# Patient Record
Sex: Female | Born: 1935
Health system: Southern US, Community
[De-identification: ages and names within clinical notes are randomized; demographics above are authoritative.]

## PROBLEM LIST (undated history)

## (undated) DIAGNOSIS — I10 Essential (primary) hypertension: Secondary | ICD-10-CM

## (undated) DIAGNOSIS — I4821 Permanent atrial fibrillation: Secondary | ICD-10-CM

## (undated) DIAGNOSIS — I2510 Atherosclerotic heart disease of native coronary artery without angina pectoris: Secondary | ICD-10-CM

## (undated) DIAGNOSIS — E119 Type 2 diabetes mellitus without complications: Secondary | ICD-10-CM

## (undated) DIAGNOSIS — D179 Benign lipomatous neoplasm, unspecified: Secondary | ICD-10-CM

## (undated) DIAGNOSIS — I499 Cardiac arrhythmia, unspecified: Secondary | ICD-10-CM

## (undated) DIAGNOSIS — R001 Bradycardia, unspecified: Secondary | ICD-10-CM

## (undated) DIAGNOSIS — G4730 Sleep apnea, unspecified: Secondary | ICD-10-CM

## (undated) DIAGNOSIS — M1990 Unspecified osteoarthritis, unspecified site: Secondary | ICD-10-CM

## (undated) DIAGNOSIS — Z950 Presence of cardiac pacemaker: Secondary | ICD-10-CM

## (undated) DIAGNOSIS — I509 Heart failure, unspecified: Secondary | ICD-10-CM

## (undated) DIAGNOSIS — Z8739 Personal history of other diseases of the musculoskeletal system and connective tissue: Secondary | ICD-10-CM

## (undated) DIAGNOSIS — F419 Anxiety disorder, unspecified: Secondary | ICD-10-CM

## (undated) DIAGNOSIS — I219 Acute myocardial infarction, unspecified: Secondary | ICD-10-CM

## (undated) HISTORY — PX: OTHER SURGICAL HISTORY: SHX169

## (undated) HISTORY — DX: Bradycardia, unspecified: R00.1

## (undated) HISTORY — PX: ABDOMINAL HYSTERECTOMY: SHX81

## (undated) HISTORY — DX: Permanent atrial fibrillation: I48.21

---

## 1990-05-10 HISTORY — PX: PACEMAKER INSERTION: SHX728

## 2008-05-31 HISTORY — PX: PACEMAKER GENERATOR CHANGE: SHX5998

## 2009-09-08 ENCOUNTER — Ambulatory Visit: Payer: Self-pay | Admitting: Cardiology

## 2011-05-19 DIAGNOSIS — N952 Postmenopausal atrophic vaginitis: Secondary | ICD-10-CM | POA: Diagnosis not present

## 2011-05-19 DIAGNOSIS — Z90710 Acquired absence of both cervix and uterus: Secondary | ICD-10-CM | POA: Diagnosis not present

## 2011-05-19 DIAGNOSIS — I509 Heart failure, unspecified: Secondary | ICD-10-CM | POA: Diagnosis not present

## 2011-05-19 DIAGNOSIS — I10 Essential (primary) hypertension: Secondary | ICD-10-CM | POA: Diagnosis not present

## 2011-05-19 DIAGNOSIS — Z01419 Encounter for gynecological examination (general) (routine) without abnormal findings: Secondary | ICD-10-CM | POA: Diagnosis not present

## 2011-05-19 DIAGNOSIS — Z1272 Encounter for screening for malignant neoplasm of vagina: Secondary | ICD-10-CM | POA: Diagnosis not present

## 2011-05-20 DIAGNOSIS — I10 Essential (primary) hypertension: Secondary | ICD-10-CM | POA: Diagnosis not present

## 2011-05-20 DIAGNOSIS — E119 Type 2 diabetes mellitus without complications: Secondary | ICD-10-CM | POA: Diagnosis not present

## 2011-05-20 DIAGNOSIS — E782 Mixed hyperlipidemia: Secondary | ICD-10-CM | POA: Diagnosis not present

## 2011-05-20 DIAGNOSIS — M159 Polyosteoarthritis, unspecified: Secondary | ICD-10-CM | POA: Diagnosis not present

## 2011-05-20 DIAGNOSIS — M549 Dorsalgia, unspecified: Secondary | ICD-10-CM | POA: Diagnosis not present

## 2011-05-20 DIAGNOSIS — N760 Acute vaginitis: Secondary | ICD-10-CM | POA: Diagnosis not present

## 2011-05-20 DIAGNOSIS — I509 Heart failure, unspecified: Secondary | ICD-10-CM | POA: Diagnosis not present

## 2011-07-12 DIAGNOSIS — E782 Mixed hyperlipidemia: Secondary | ICD-10-CM | POA: Diagnosis not present

## 2011-07-12 DIAGNOSIS — N760 Acute vaginitis: Secondary | ICD-10-CM | POA: Diagnosis not present

## 2011-07-12 DIAGNOSIS — I10 Essential (primary) hypertension: Secondary | ICD-10-CM | POA: Diagnosis not present

## 2011-07-12 DIAGNOSIS — M159 Polyosteoarthritis, unspecified: Secondary | ICD-10-CM | POA: Diagnosis not present

## 2011-07-12 DIAGNOSIS — M549 Dorsalgia, unspecified: Secondary | ICD-10-CM | POA: Diagnosis not present

## 2011-07-12 DIAGNOSIS — I509 Heart failure, unspecified: Secondary | ICD-10-CM | POA: Diagnosis not present

## 2011-07-13 DIAGNOSIS — Z45018 Encounter for adjustment and management of other part of cardiac pacemaker: Secondary | ICD-10-CM | POA: Diagnosis not present

## 2011-07-13 DIAGNOSIS — I495 Sick sinus syndrome: Secondary | ICD-10-CM | POA: Diagnosis not present

## 2011-10-19 DIAGNOSIS — I495 Sick sinus syndrome: Secondary | ICD-10-CM | POA: Diagnosis not present

## 2011-10-19 DIAGNOSIS — Z45018 Encounter for adjustment and management of other part of cardiac pacemaker: Secondary | ICD-10-CM | POA: Diagnosis not present

## 2011-11-01 DIAGNOSIS — M25539 Pain in unspecified wrist: Secondary | ICD-10-CM | POA: Diagnosis not present

## 2011-11-01 DIAGNOSIS — E782 Mixed hyperlipidemia: Secondary | ICD-10-CM | POA: Diagnosis not present

## 2011-11-01 DIAGNOSIS — M549 Dorsalgia, unspecified: Secondary | ICD-10-CM | POA: Diagnosis not present

## 2011-12-16 DIAGNOSIS — G4730 Sleep apnea, unspecified: Secondary | ICD-10-CM | POA: Diagnosis not present

## 2011-12-16 DIAGNOSIS — R55 Syncope and collapse: Secondary | ICD-10-CM | POA: Diagnosis not present

## 2011-12-20 DIAGNOSIS — R0609 Other forms of dyspnea: Secondary | ICD-10-CM | POA: Diagnosis not present

## 2011-12-20 DIAGNOSIS — I079 Rheumatic tricuspid valve disease, unspecified: Secondary | ICD-10-CM | POA: Diagnosis not present

## 2011-12-20 DIAGNOSIS — R55 Syncope and collapse: Secondary | ICD-10-CM | POA: Diagnosis not present

## 2011-12-20 DIAGNOSIS — I059 Rheumatic mitral valve disease, unspecified: Secondary | ICD-10-CM | POA: Diagnosis not present

## 2011-12-20 DIAGNOSIS — I2789 Other specified pulmonary heart diseases: Secondary | ICD-10-CM | POA: Diagnosis not present

## 2011-12-20 DIAGNOSIS — R5381 Other malaise: Secondary | ICD-10-CM | POA: Diagnosis not present

## 2012-01-02 DIAGNOSIS — I10 Essential (primary) hypertension: Secondary | ICD-10-CM | POA: Diagnosis not present

## 2012-01-02 DIAGNOSIS — I509 Heart failure, unspecified: Secondary | ICD-10-CM | POA: Diagnosis not present

## 2012-01-02 DIAGNOSIS — E119 Type 2 diabetes mellitus without complications: Secondary | ICD-10-CM | POA: Diagnosis not present

## 2012-01-02 DIAGNOSIS — M1710 Unilateral primary osteoarthritis, unspecified knee: Secondary | ICD-10-CM | POA: Diagnosis not present

## 2012-01-18 DIAGNOSIS — I495 Sick sinus syndrome: Secondary | ICD-10-CM | POA: Diagnosis not present

## 2012-03-02 DIAGNOSIS — E119 Type 2 diabetes mellitus without complications: Secondary | ICD-10-CM | POA: Diagnosis not present

## 2012-03-02 DIAGNOSIS — I509 Heart failure, unspecified: Secondary | ICD-10-CM | POA: Diagnosis not present

## 2012-03-02 DIAGNOSIS — Z794 Long term (current) use of insulin: Secondary | ICD-10-CM | POA: Diagnosis not present

## 2012-03-02 DIAGNOSIS — M1710 Unilateral primary osteoarthritis, unspecified knee: Secondary | ICD-10-CM | POA: Diagnosis not present

## 2012-03-02 DIAGNOSIS — I10 Essential (primary) hypertension: Secondary | ICD-10-CM | POA: Diagnosis not present

## 2012-03-16 DIAGNOSIS — I10 Essential (primary) hypertension: Secondary | ICD-10-CM | POA: Diagnosis not present

## 2012-04-18 DIAGNOSIS — I10 Essential (primary) hypertension: Secondary | ICD-10-CM | POA: Diagnosis not present

## 2012-04-18 DIAGNOSIS — I495 Sick sinus syndrome: Secondary | ICD-10-CM | POA: Diagnosis not present

## 2012-05-05 DIAGNOSIS — I10 Essential (primary) hypertension: Secondary | ICD-10-CM | POA: Diagnosis not present

## 2012-05-05 DIAGNOSIS — Z780 Asymptomatic menopausal state: Secondary | ICD-10-CM | POA: Diagnosis not present

## 2012-05-05 DIAGNOSIS — M1990 Unspecified osteoarthritis, unspecified site: Secondary | ICD-10-CM | POA: Diagnosis present

## 2012-05-05 DIAGNOSIS — Z79899 Other long term (current) drug therapy: Secondary | ICD-10-CM | POA: Diagnosis not present

## 2012-05-05 DIAGNOSIS — Z950 Presence of cardiac pacemaker: Secondary | ICD-10-CM | POA: Diagnosis not present

## 2012-05-05 DIAGNOSIS — I5031 Acute diastolic (congestive) heart failure: Secondary | ICD-10-CM | POA: Diagnosis not present

## 2012-05-05 DIAGNOSIS — E785 Hyperlipidemia, unspecified: Secondary | ICD-10-CM | POA: Diagnosis not present

## 2012-05-05 DIAGNOSIS — Z8042 Family history of malignant neoplasm of prostate: Secondary | ICD-10-CM | POA: Diagnosis not present

## 2012-05-05 DIAGNOSIS — Z87891 Personal history of nicotine dependence: Secondary | ICD-10-CM | POA: Diagnosis not present

## 2012-05-05 DIAGNOSIS — Z7982 Long term (current) use of aspirin: Secondary | ICD-10-CM | POA: Diagnosis not present

## 2012-05-05 DIAGNOSIS — I369 Nonrheumatic tricuspid valve disorder, unspecified: Secondary | ICD-10-CM | POA: Diagnosis not present

## 2012-05-05 DIAGNOSIS — J90 Pleural effusion, not elsewhere classified: Secondary | ICD-10-CM | POA: Diagnosis not present

## 2012-05-05 DIAGNOSIS — R0602 Shortness of breath: Secondary | ICD-10-CM | POA: Diagnosis not present

## 2012-05-05 DIAGNOSIS — I509 Heart failure, unspecified: Secondary | ICD-10-CM | POA: Diagnosis not present

## 2012-05-05 DIAGNOSIS — I517 Cardiomegaly: Secondary | ICD-10-CM | POA: Diagnosis not present

## 2012-05-05 DIAGNOSIS — E119 Type 2 diabetes mellitus without complications: Secondary | ICD-10-CM | POA: Diagnosis not present

## 2012-05-05 DIAGNOSIS — Z794 Long term (current) use of insulin: Secondary | ICD-10-CM | POA: Diagnosis not present

## 2012-05-05 DIAGNOSIS — Z8249 Family history of ischemic heart disease and other diseases of the circulatory system: Secondary | ICD-10-CM | POA: Diagnosis not present

## 2012-05-11 DIAGNOSIS — I509 Heart failure, unspecified: Secondary | ICD-10-CM | POA: Diagnosis not present

## 2012-05-11 DIAGNOSIS — E119 Type 2 diabetes mellitus without complications: Secondary | ICD-10-CM | POA: Diagnosis not present

## 2012-05-11 DIAGNOSIS — Z794 Long term (current) use of insulin: Secondary | ICD-10-CM | POA: Diagnosis not present

## 2012-05-11 DIAGNOSIS — I10 Essential (primary) hypertension: Secondary | ICD-10-CM | POA: Diagnosis not present

## 2012-05-11 DIAGNOSIS — M159 Polyosteoarthritis, unspecified: Secondary | ICD-10-CM | POA: Diagnosis not present

## 2012-05-11 DIAGNOSIS — I5031 Acute diastolic (congestive) heart failure: Secondary | ICD-10-CM | POA: Diagnosis not present

## 2012-05-16 DIAGNOSIS — E119 Type 2 diabetes mellitus without complications: Secondary | ICD-10-CM | POA: Diagnosis not present

## 2012-05-16 DIAGNOSIS — M159 Polyosteoarthritis, unspecified: Secondary | ICD-10-CM | POA: Diagnosis not present

## 2012-05-16 DIAGNOSIS — I509 Heart failure, unspecified: Secondary | ICD-10-CM | POA: Diagnosis not present

## 2012-05-16 DIAGNOSIS — I10 Essential (primary) hypertension: Secondary | ICD-10-CM | POA: Diagnosis not present

## 2012-05-16 DIAGNOSIS — Z794 Long term (current) use of insulin: Secondary | ICD-10-CM | POA: Diagnosis not present

## 2012-05-16 DIAGNOSIS — I5031 Acute diastolic (congestive) heart failure: Secondary | ICD-10-CM | POA: Diagnosis not present

## 2012-05-18 DIAGNOSIS — I10 Essential (primary) hypertension: Secondary | ICD-10-CM | POA: Diagnosis not present

## 2012-05-18 DIAGNOSIS — I509 Heart failure, unspecified: Secondary | ICD-10-CM | POA: Diagnosis not present

## 2012-05-18 DIAGNOSIS — M159 Polyosteoarthritis, unspecified: Secondary | ICD-10-CM | POA: Diagnosis not present

## 2012-05-18 DIAGNOSIS — Z794 Long term (current) use of insulin: Secondary | ICD-10-CM | POA: Diagnosis not present

## 2012-05-18 DIAGNOSIS — I5031 Acute diastolic (congestive) heart failure: Secondary | ICD-10-CM | POA: Diagnosis not present

## 2012-05-18 DIAGNOSIS — E119 Type 2 diabetes mellitus without complications: Secondary | ICD-10-CM | POA: Diagnosis not present

## 2012-05-23 DIAGNOSIS — J209 Acute bronchitis, unspecified: Secondary | ICD-10-CM | POA: Diagnosis not present

## 2012-05-23 DIAGNOSIS — E782 Mixed hyperlipidemia: Secondary | ICD-10-CM | POA: Diagnosis not present

## 2012-05-24 DIAGNOSIS — M159 Polyosteoarthritis, unspecified: Secondary | ICD-10-CM | POA: Diagnosis not present

## 2012-05-24 DIAGNOSIS — Z794 Long term (current) use of insulin: Secondary | ICD-10-CM | POA: Diagnosis not present

## 2012-05-24 DIAGNOSIS — I5031 Acute diastolic (congestive) heart failure: Secondary | ICD-10-CM | POA: Diagnosis not present

## 2012-05-24 DIAGNOSIS — E119 Type 2 diabetes mellitus without complications: Secondary | ICD-10-CM | POA: Diagnosis not present

## 2012-05-24 DIAGNOSIS — I509 Heart failure, unspecified: Secondary | ICD-10-CM | POA: Diagnosis not present

## 2012-05-24 DIAGNOSIS — I10 Essential (primary) hypertension: Secondary | ICD-10-CM | POA: Diagnosis not present

## 2012-05-30 DIAGNOSIS — B372 Candidiasis of skin and nail: Secondary | ICD-10-CM | POA: Diagnosis not present

## 2012-06-12 DIAGNOSIS — I5031 Acute diastolic (congestive) heart failure: Secondary | ICD-10-CM | POA: Diagnosis not present

## 2012-06-12 DIAGNOSIS — I10 Essential (primary) hypertension: Secondary | ICD-10-CM | POA: Diagnosis not present

## 2012-06-12 DIAGNOSIS — M159 Polyosteoarthritis, unspecified: Secondary | ICD-10-CM | POA: Diagnosis not present

## 2012-06-12 DIAGNOSIS — E119 Type 2 diabetes mellitus without complications: Secondary | ICD-10-CM | POA: Diagnosis not present

## 2012-06-12 DIAGNOSIS — Z794 Long term (current) use of insulin: Secondary | ICD-10-CM | POA: Diagnosis not present

## 2012-06-12 DIAGNOSIS — I509 Heart failure, unspecified: Secondary | ICD-10-CM | POA: Diagnosis not present

## 2012-06-15 DIAGNOSIS — Z794 Long term (current) use of insulin: Secondary | ICD-10-CM | POA: Diagnosis not present

## 2012-06-15 DIAGNOSIS — I5031 Acute diastolic (congestive) heart failure: Secondary | ICD-10-CM | POA: Diagnosis not present

## 2012-06-15 DIAGNOSIS — I10 Essential (primary) hypertension: Secondary | ICD-10-CM | POA: Diagnosis not present

## 2012-06-15 DIAGNOSIS — E119 Type 2 diabetes mellitus without complications: Secondary | ICD-10-CM | POA: Diagnosis not present

## 2012-06-15 DIAGNOSIS — M159 Polyosteoarthritis, unspecified: Secondary | ICD-10-CM | POA: Diagnosis not present

## 2012-06-15 DIAGNOSIS — I509 Heart failure, unspecified: Secondary | ICD-10-CM | POA: Diagnosis not present

## 2012-07-03 DIAGNOSIS — I5031 Acute diastolic (congestive) heart failure: Secondary | ICD-10-CM | POA: Diagnosis not present

## 2012-07-20 DIAGNOSIS — I495 Sick sinus syndrome: Secondary | ICD-10-CM | POA: Diagnosis not present

## 2012-08-22 DIAGNOSIS — M109 Gout, unspecified: Secondary | ICD-10-CM | POA: Diagnosis not present

## 2012-08-29 DIAGNOSIS — I519 Heart disease, unspecified: Secondary | ICD-10-CM | POA: Diagnosis not present

## 2012-08-29 DIAGNOSIS — I10 Essential (primary) hypertension: Secondary | ICD-10-CM | POA: Diagnosis not present

## 2012-08-29 DIAGNOSIS — E785 Hyperlipidemia, unspecified: Secondary | ICD-10-CM | POA: Diagnosis not present

## 2012-08-29 DIAGNOSIS — E119 Type 2 diabetes mellitus without complications: Secondary | ICD-10-CM | POA: Diagnosis not present

## 2012-10-28 DIAGNOSIS — E119 Type 2 diabetes mellitus without complications: Secondary | ICD-10-CM | POA: Diagnosis not present

## 2012-10-28 DIAGNOSIS — E785 Hyperlipidemia, unspecified: Secondary | ICD-10-CM | POA: Diagnosis not present

## 2012-10-28 DIAGNOSIS — I519 Heart disease, unspecified: Secondary | ICD-10-CM | POA: Diagnosis not present

## 2012-10-28 DIAGNOSIS — I10 Essential (primary) hypertension: Secondary | ICD-10-CM | POA: Diagnosis not present

## 2012-11-11 DIAGNOSIS — R079 Chest pain, unspecified: Secondary | ICD-10-CM | POA: Diagnosis not present

## 2012-11-11 DIAGNOSIS — M25519 Pain in unspecified shoulder: Secondary | ICD-10-CM | POA: Diagnosis not present

## 2012-11-11 DIAGNOSIS — S6990XA Unspecified injury of unspecified wrist, hand and finger(s), initial encounter: Secondary | ICD-10-CM | POA: Diagnosis not present

## 2012-11-11 DIAGNOSIS — S298XXA Other specified injuries of thorax, initial encounter: Secondary | ICD-10-CM | POA: Diagnosis not present

## 2012-11-11 DIAGNOSIS — M7989 Other specified soft tissue disorders: Secondary | ICD-10-CM | POA: Diagnosis not present

## 2012-11-11 DIAGNOSIS — S4980XA Other specified injuries of shoulder and upper arm, unspecified arm, initial encounter: Secondary | ICD-10-CM | POA: Diagnosis not present

## 2012-11-11 DIAGNOSIS — M79609 Pain in unspecified limb: Secondary | ICD-10-CM | POA: Diagnosis not present

## 2012-11-11 DIAGNOSIS — IMO0002 Reserved for concepts with insufficient information to code with codable children: Secondary | ICD-10-CM | POA: Diagnosis not present

## 2012-11-23 DIAGNOSIS — M542 Cervicalgia: Secondary | ICD-10-CM

## 2012-12-04 DIAGNOSIS — E782 Mixed hyperlipidemia: Secondary | ICD-10-CM | POA: Diagnosis not present

## 2012-12-04 DIAGNOSIS — R609 Edema, unspecified: Secondary | ICD-10-CM | POA: Diagnosis not present

## 2012-12-27 DIAGNOSIS — I10 Essential (primary) hypertension: Secondary | ICD-10-CM | POA: Diagnosis not present

## 2012-12-27 DIAGNOSIS — E119 Type 2 diabetes mellitus without complications: Secondary | ICD-10-CM | POA: Diagnosis not present

## 2012-12-27 DIAGNOSIS — I519 Heart disease, unspecified: Secondary | ICD-10-CM | POA: Diagnosis not present

## 2012-12-27 DIAGNOSIS — E785 Hyperlipidemia, unspecified: Secondary | ICD-10-CM | POA: Diagnosis not present

## 2013-01-15 DIAGNOSIS — Z23 Encounter for immunization: Secondary | ICD-10-CM | POA: Diagnosis not present

## 2013-02-25 DIAGNOSIS — I519 Heart disease, unspecified: Secondary | ICD-10-CM | POA: Diagnosis not present

## 2013-02-25 DIAGNOSIS — I10 Essential (primary) hypertension: Secondary | ICD-10-CM | POA: Diagnosis not present

## 2013-02-25 DIAGNOSIS — E785 Hyperlipidemia, unspecified: Secondary | ICD-10-CM | POA: Diagnosis not present

## 2013-02-25 DIAGNOSIS — E119 Type 2 diabetes mellitus without complications: Secondary | ICD-10-CM | POA: Diagnosis not present

## 2013-03-06 DIAGNOSIS — I10 Essential (primary) hypertension: Secondary | ICD-10-CM | POA: Diagnosis not present

## 2013-04-26 DIAGNOSIS — I519 Heart disease, unspecified: Secondary | ICD-10-CM | POA: Diagnosis not present

## 2013-04-26 DIAGNOSIS — I10 Essential (primary) hypertension: Secondary | ICD-10-CM | POA: Diagnosis not present

## 2013-04-26 DIAGNOSIS — E785 Hyperlipidemia, unspecified: Secondary | ICD-10-CM | POA: Diagnosis not present

## 2013-04-26 DIAGNOSIS — E119 Type 2 diabetes mellitus without complications: Secondary | ICD-10-CM | POA: Diagnosis not present

## 2013-05-24 DIAGNOSIS — H538 Other visual disturbances: Secondary | ICD-10-CM | POA: Diagnosis not present

## 2013-05-24 DIAGNOSIS — H2510 Age-related nuclear cataract, unspecified eye: Secondary | ICD-10-CM | POA: Diagnosis not present

## 2013-06-05 DIAGNOSIS — I10 Essential (primary) hypertension: Secondary | ICD-10-CM | POA: Diagnosis not present

## 2013-06-05 DIAGNOSIS — E782 Mixed hyperlipidemia: Secondary | ICD-10-CM | POA: Diagnosis not present

## 2013-06-05 DIAGNOSIS — IMO0001 Reserved for inherently not codable concepts without codable children: Secondary | ICD-10-CM | POA: Diagnosis not present

## 2013-06-25 DIAGNOSIS — E785 Hyperlipidemia, unspecified: Secondary | ICD-10-CM | POA: Diagnosis not present

## 2013-06-25 DIAGNOSIS — E119 Type 2 diabetes mellitus without complications: Secondary | ICD-10-CM | POA: Diagnosis not present

## 2013-06-25 DIAGNOSIS — I10 Essential (primary) hypertension: Secondary | ICD-10-CM | POA: Diagnosis not present

## 2013-06-25 DIAGNOSIS — I519 Heart disease, unspecified: Secondary | ICD-10-CM | POA: Diagnosis not present

## 2013-08-24 DIAGNOSIS — I10 Essential (primary) hypertension: Secondary | ICD-10-CM | POA: Diagnosis not present

## 2013-08-24 DIAGNOSIS — E785 Hyperlipidemia, unspecified: Secondary | ICD-10-CM | POA: Diagnosis not present

## 2013-08-24 DIAGNOSIS — E119 Type 2 diabetes mellitus without complications: Secondary | ICD-10-CM | POA: Diagnosis not present

## 2013-08-24 DIAGNOSIS — I519 Heart disease, unspecified: Secondary | ICD-10-CM | POA: Diagnosis not present

## 2013-08-30 DIAGNOSIS — M549 Dorsalgia, unspecified: Secondary | ICD-10-CM | POA: Diagnosis not present

## 2013-09-13 DIAGNOSIS — Z1231 Encounter for screening mammogram for malignant neoplasm of breast: Secondary | ICD-10-CM | POA: Diagnosis not present

## 2013-10-23 DIAGNOSIS — I10 Essential (primary) hypertension: Secondary | ICD-10-CM | POA: Diagnosis not present

## 2013-10-23 DIAGNOSIS — E785 Hyperlipidemia, unspecified: Secondary | ICD-10-CM | POA: Diagnosis not present

## 2013-10-23 DIAGNOSIS — E119 Type 2 diabetes mellitus without complications: Secondary | ICD-10-CM | POA: Diagnosis not present

## 2013-10-23 DIAGNOSIS — I519 Heart disease, unspecified: Secondary | ICD-10-CM | POA: Diagnosis not present

## 2013-11-29 DIAGNOSIS — M5430 Sciatica, unspecified side: Secondary | ICD-10-CM | POA: Diagnosis not present

## 2013-11-29 DIAGNOSIS — IMO0001 Reserved for inherently not codable concepts without codable children: Secondary | ICD-10-CM | POA: Diagnosis not present

## 2013-12-05 DIAGNOSIS — E782 Mixed hyperlipidemia: Secondary | ICD-10-CM | POA: Diagnosis not present

## 2013-12-05 DIAGNOSIS — IMO0001 Reserved for inherently not codable concepts without codable children: Secondary | ICD-10-CM | POA: Diagnosis not present

## 2013-12-22 DIAGNOSIS — I519 Heart disease, unspecified: Secondary | ICD-10-CM | POA: Diagnosis not present

## 2013-12-22 DIAGNOSIS — I10 Essential (primary) hypertension: Secondary | ICD-10-CM | POA: Diagnosis not present

## 2013-12-22 DIAGNOSIS — E785 Hyperlipidemia, unspecified: Secondary | ICD-10-CM | POA: Diagnosis not present

## 2013-12-22 DIAGNOSIS — E119 Type 2 diabetes mellitus without complications: Secondary | ICD-10-CM | POA: Diagnosis not present

## 2014-02-15 DIAGNOSIS — Z23 Encounter for immunization: Secondary | ICD-10-CM | POA: Diagnosis not present

## 2014-02-20 DIAGNOSIS — E785 Hyperlipidemia, unspecified: Secondary | ICD-10-CM | POA: Diagnosis not present

## 2014-02-20 DIAGNOSIS — I10 Essential (primary) hypertension: Secondary | ICD-10-CM | POA: Diagnosis not present

## 2014-02-20 DIAGNOSIS — I519 Heart disease, unspecified: Secondary | ICD-10-CM | POA: Diagnosis not present

## 2014-02-20 DIAGNOSIS — E119 Type 2 diabetes mellitus without complications: Secondary | ICD-10-CM | POA: Diagnosis not present

## 2014-03-11 DIAGNOSIS — E1165 Type 2 diabetes mellitus with hyperglycemia: Secondary | ICD-10-CM | POA: Diagnosis not present

## 2014-03-11 DIAGNOSIS — I10 Essential (primary) hypertension: Secondary | ICD-10-CM | POA: Diagnosis not present

## 2014-03-31 DIAGNOSIS — I10 Essential (primary) hypertension: Secondary | ICD-10-CM | POA: Diagnosis not present

## 2014-03-31 DIAGNOSIS — M25511 Pain in right shoulder: Secondary | ICD-10-CM | POA: Diagnosis not present

## 2014-03-31 DIAGNOSIS — Z79899 Other long term (current) drug therapy: Secondary | ICD-10-CM | POA: Diagnosis not present

## 2014-03-31 DIAGNOSIS — E119 Type 2 diabetes mellitus without complications: Secondary | ICD-10-CM | POA: Diagnosis not present

## 2014-03-31 DIAGNOSIS — Z87891 Personal history of nicotine dependence: Secondary | ICD-10-CM | POA: Diagnosis not present

## 2014-03-31 DIAGNOSIS — K219 Gastro-esophageal reflux disease without esophagitis: Secondary | ICD-10-CM | POA: Diagnosis not present

## 2014-03-31 DIAGNOSIS — M79622 Pain in left upper arm: Secondary | ICD-10-CM | POA: Diagnosis not present

## 2014-03-31 DIAGNOSIS — M25512 Pain in left shoulder: Secondary | ICD-10-CM | POA: Diagnosis not present

## 2014-03-31 DIAGNOSIS — M79621 Pain in right upper arm: Secondary | ICD-10-CM | POA: Diagnosis not present

## 2014-03-31 DIAGNOSIS — Z794 Long term (current) use of insulin: Secondary | ICD-10-CM | POA: Diagnosis not present

## 2014-03-31 DIAGNOSIS — S161XXA Strain of muscle, fascia and tendon at neck level, initial encounter: Secondary | ICD-10-CM | POA: Diagnosis not present

## 2014-03-31 DIAGNOSIS — Y92410 Unspecified street and highway as the place of occurrence of the external cause: Secondary | ICD-10-CM | POA: Diagnosis not present

## 2014-04-21 DIAGNOSIS — E785 Hyperlipidemia, unspecified: Secondary | ICD-10-CM | POA: Diagnosis not present

## 2014-04-21 DIAGNOSIS — I519 Heart disease, unspecified: Secondary | ICD-10-CM | POA: Diagnosis not present

## 2014-04-21 DIAGNOSIS — I10 Essential (primary) hypertension: Secondary | ICD-10-CM | POA: Diagnosis not present

## 2014-04-21 DIAGNOSIS — E119 Type 2 diabetes mellitus without complications: Secondary | ICD-10-CM | POA: Diagnosis not present

## 2014-06-10 DIAGNOSIS — Z1389 Encounter for screening for other disorder: Secondary | ICD-10-CM | POA: Diagnosis not present

## 2014-06-10 DIAGNOSIS — Z0000 Encounter for general adult medical examination without abnormal findings: Secondary | ICD-10-CM | POA: Diagnosis not present

## 2014-06-10 DIAGNOSIS — E0842 Diabetes mellitus due to underlying condition with diabetic polyneuropathy: Secondary | ICD-10-CM | POA: Diagnosis not present

## 2014-06-20 DIAGNOSIS — E119 Type 2 diabetes mellitus without complications: Secondary | ICD-10-CM | POA: Diagnosis not present

## 2014-06-20 DIAGNOSIS — E785 Hyperlipidemia, unspecified: Secondary | ICD-10-CM | POA: Diagnosis not present

## 2014-06-20 DIAGNOSIS — I10 Essential (primary) hypertension: Secondary | ICD-10-CM | POA: Diagnosis not present

## 2014-06-20 DIAGNOSIS — I519 Heart disease, unspecified: Secondary | ICD-10-CM | POA: Diagnosis not present

## 2014-08-19 DIAGNOSIS — E119 Type 2 diabetes mellitus without complications: Secondary | ICD-10-CM | POA: Diagnosis not present

## 2014-08-19 DIAGNOSIS — E785 Hyperlipidemia, unspecified: Secondary | ICD-10-CM | POA: Diagnosis not present

## 2014-08-19 DIAGNOSIS — I10 Essential (primary) hypertension: Secondary | ICD-10-CM | POA: Diagnosis not present

## 2014-08-19 DIAGNOSIS — I519 Heart disease, unspecified: Secondary | ICD-10-CM | POA: Diagnosis not present

## 2014-09-09 DIAGNOSIS — E119 Type 2 diabetes mellitus without complications: Secondary | ICD-10-CM | POA: Diagnosis not present

## 2014-09-09 DIAGNOSIS — L84 Corns and callosities: Secondary | ICD-10-CM | POA: Diagnosis not present

## 2014-09-09 DIAGNOSIS — E0842 Diabetes mellitus due to underlying condition with diabetic polyneuropathy: Secondary | ICD-10-CM | POA: Diagnosis not present

## 2014-09-12 DIAGNOSIS — E119 Type 2 diabetes mellitus without complications: Secondary | ICD-10-CM | POA: Diagnosis not present

## 2014-09-17 DIAGNOSIS — Z1231 Encounter for screening mammogram for malignant neoplasm of breast: Secondary | ICD-10-CM | POA: Diagnosis not present

## 2014-09-22 ENCOUNTER — Other Ambulatory Visit (HOSPITAL_COMMUNITY): Payer: Self-pay

## 2014-09-22 ENCOUNTER — Emergency Department (HOSPITAL_COMMUNITY): Payer: Medicare Other

## 2014-09-22 ENCOUNTER — Observation Stay (HOSPITAL_COMMUNITY)
Admission: EM | Admit: 2014-09-22 | Discharge: 2014-09-24 | Disposition: A | Discharge status: Home / Self Care | Payer: Medicare Other | Attending: Internal Medicine | Admitting: Internal Medicine

## 2014-09-22 ENCOUNTER — Encounter (HOSPITAL_COMMUNITY): Payer: Self-pay | Admitting: Emergency Medicine

## 2014-09-22 DIAGNOSIS — I2510 Atherosclerotic heart disease of native coronary artery without angina pectoris: Secondary | ICD-10-CM | POA: Diagnosis present

## 2014-09-22 DIAGNOSIS — I4891 Unspecified atrial fibrillation: Secondary | ICD-10-CM | POA: Diagnosis not present

## 2014-09-22 DIAGNOSIS — R0789 Other chest pain: Secondary | ICD-10-CM | POA: Diagnosis not present

## 2014-09-22 DIAGNOSIS — I10 Essential (primary) hypertension: Secondary | ICD-10-CM | POA: Diagnosis present

## 2014-09-22 DIAGNOSIS — E119 Type 2 diabetes mellitus without complications: Secondary | ICD-10-CM

## 2014-09-22 DIAGNOSIS — Z8679 Personal history of other diseases of the circulatory system: Secondary | ICD-10-CM

## 2014-09-22 DIAGNOSIS — M109 Gout, unspecified: Secondary | ICD-10-CM | POA: Diagnosis present

## 2014-09-22 DIAGNOSIS — R9431 Abnormal electrocardiogram [ECG] [EKG]: Secondary | ICD-10-CM

## 2014-09-22 DIAGNOSIS — Z950 Presence of cardiac pacemaker: Secondary | ICD-10-CM | POA: Diagnosis present

## 2014-09-22 DIAGNOSIS — R079 Chest pain, unspecified: Secondary | ICD-10-CM | POA: Diagnosis not present

## 2014-09-22 DIAGNOSIS — I509 Heart failure, unspecified: Secondary | ICD-10-CM | POA: Diagnosis not present

## 2014-09-22 DIAGNOSIS — R531 Weakness: Secondary | ICD-10-CM | POA: Diagnosis not present

## 2014-09-22 DIAGNOSIS — R609 Edema, unspecified: Secondary | ICD-10-CM | POA: Diagnosis not present

## 2014-09-22 DIAGNOSIS — I25118 Atherosclerotic heart disease of native coronary artery with other forms of angina pectoris: Secondary | ICD-10-CM | POA: Diagnosis not present

## 2014-09-22 DIAGNOSIS — N179 Acute kidney failure, unspecified: Secondary | ICD-10-CM | POA: Diagnosis present

## 2014-09-22 DIAGNOSIS — Z794 Long term (current) use of insulin: Secondary | ICD-10-CM

## 2014-09-22 DIAGNOSIS — R0602 Shortness of breath: Secondary | ICD-10-CM | POA: Diagnosis not present

## 2014-09-22 HISTORY — DX: Anxiety disorder, unspecified: F41.9

## 2014-09-22 HISTORY — DX: Type 2 diabetes mellitus without complications: E11.9

## 2014-09-22 HISTORY — DX: Cardiac arrhythmia, unspecified: I49.9

## 2014-09-22 HISTORY — DX: Presence of cardiac pacemaker: Z95.0

## 2014-09-22 HISTORY — DX: Sleep apnea, unspecified: G47.30

## 2014-09-22 HISTORY — DX: Acute myocardial infarction, unspecified: I21.9

## 2014-09-22 HISTORY — DX: Atherosclerotic heart disease of native coronary artery without angina pectoris: I25.10

## 2014-09-22 HISTORY — DX: Essential (primary) hypertension: I10

## 2014-09-22 HISTORY — DX: Heart failure, unspecified: I50.9

## 2014-09-22 HISTORY — DX: Benign lipomatous neoplasm, unspecified: D17.9

## 2014-09-22 LAB — I-STAT CHEM 8, ED
BUN: 44 mg/dL — ABNORMAL HIGH (ref 6–20)
CALCIUM ION: 1.14 mmol/L (ref 1.13–1.30)
CREATININE: 1.3 mg/dL — AB (ref 0.44–1.00)
Chloride: 98 mmol/L — ABNORMAL LOW (ref 101–111)
GLUCOSE: 154 mg/dL — AB (ref 65–99)
HCT: 43 % (ref 36.0–46.0)
Hemoglobin: 14.6 g/dL (ref 12.0–15.0)
Potassium: 4.1 mmol/L (ref 3.5–5.1)
Sodium: 138 mmol/L (ref 135–145)
TCO2: 26 mmol/L (ref 0–100)

## 2014-09-22 LAB — CBC
HCT: 39.8 % (ref 36.0–46.0)
HEMOGLOBIN: 12.8 g/dL (ref 12.0–15.0)
MCH: 27.8 pg (ref 26.0–34.0)
MCHC: 32.2 g/dL (ref 30.0–36.0)
MCV: 86.5 fL (ref 78.0–100.0)
PLATELETS: 183 10*3/uL (ref 150–400)
RBC: 4.6 MIL/uL (ref 3.87–5.11)
RDW: 13.2 % (ref 11.5–15.5)
WBC: 3.8 10*3/uL — AB (ref 4.0–10.5)

## 2014-09-22 LAB — COMPREHENSIVE METABOLIC PANEL
ALBUMIN: 4.2 g/dL (ref 3.5–5.0)
ALT: 20 U/L (ref 14–54)
ANION GAP: 10 (ref 5–15)
AST: 37 U/L (ref 15–41)
Alkaline Phosphatase: 56 U/L (ref 38–126)
BUN: 36 mg/dL — ABNORMAL HIGH (ref 6–20)
CALCIUM: 9.6 mg/dL (ref 8.9–10.3)
CO2: 28 mmol/L (ref 22–32)
Chloride: 100 mmol/L — ABNORMAL LOW (ref 101–111)
Creatinine, Ser: 1.3 mg/dL — ABNORMAL HIGH (ref 0.44–1.00)
GFR calc Af Amer: 44 mL/min — ABNORMAL LOW (ref >60)
GFR, EST NON AFRICAN AMERICAN: 38 mL/min — AB (ref >60)
GLUCOSE: 158 mg/dL — AB (ref 65–99)
Potassium: 4.1 mmol/L (ref 3.5–5.1)
SODIUM: 138 mmol/L (ref 135–145)
Total Bilirubin: 1.1 mg/dL (ref 0.3–1.2)
Total Protein: 7.4 g/dL (ref 6.5–8.1)

## 2014-09-22 LAB — PROTIME-INR
INR: 1.09 (ref 0.00–1.49)
Prothrombin Time: 14.2 seconds (ref 11.6–15.2)

## 2014-09-22 LAB — APTT: aPTT: 23 seconds — ABNORMAL LOW (ref 24–37)

## 2014-09-22 LAB — GLUCOSE, CAPILLARY
Glucose-Capillary: 91 mg/dL (ref 65–99)
Glucose-Capillary: 159 mg/dL — ABNORMAL HIGH (ref 65–99)

## 2014-09-22 LAB — I-STAT TROPONIN, ED: Troponin i, poc: 0.02 ng/mL (ref 0.00–0.08)

## 2014-09-22 LAB — TROPONIN I: Troponin I: <0.03 ng/mL (ref <0.031)

## 2014-09-22 MED ORDER — INSULIN ASPART 100 UNIT/ML ~~LOC~~ SOLN
0.0000 [IU] | Freq: Three times a day (TID) | SUBCUTANEOUS | Status: DC
Start: 2014-09-22 — End: 2014-09-24

## 2014-09-22 MED ORDER — NITROGLYCERIN 0.4 MG SL SUBL: 0.4000 mg | SUBLINGUAL_TABLET | SUBLINGUAL | Status: DC | PRN

## 2014-09-22 MED ORDER — LISINOPRIL 20 MG PO TABS: 20.0000 mg | ORAL_TABLET | Freq: Every day | ORAL | Status: DC

## 2014-09-22 MED ORDER — HEPARIN SODIUM (PORCINE) 5000 UNIT/ML IJ SOLN
5000.0000 [IU] | Freq: Three times a day (TID) | INTRAMUSCULAR | Status: DC
  Administered 2014-09-22 – 2014-09-24 (×6): 5000 [IU] via SUBCUTANEOUS
  Filled 2014-09-22 (×6): qty 1

## 2014-09-22 MED ORDER — TRAMADOL HCL 50 MG PO TABS
50.0000 mg | ORAL_TABLET | Freq: Three times a day (TID) | ORAL | Status: DC
  Administered 2014-09-22 – 2014-09-24 (×7): 50 mg via ORAL
  Filled 2014-09-22 (×7): qty 1

## 2014-09-22 MED ORDER — SODIUM CHLORIDE 0.9 % IV SOLN: Freq: Once | INTRAVENOUS | Status: AC

## 2014-09-22 MED ORDER — MORPHINE SULFATE 2 MG/ML IJ SOLN: 2.0000 mg | INTRAMUSCULAR | Status: DC | PRN

## 2014-09-22 MED ORDER — FUROSEMIDE 20 MG PO TABS: 10.0000 mg | ORAL_TABLET | Freq: Every day | ORAL | Status: DC

## 2014-09-22 MED ORDER — ONDANSETRON HCL 4 MG/2ML IJ SOLN: 4.0000 mg | Freq: Four times a day (QID) | INTRAMUSCULAR | Status: DC | PRN

## 2014-09-22 MED ORDER — INSULIN ASPART PROT & ASPART (70-30 MIX) 100 UNIT/ML ~~LOC~~ SUSP
14.0000 [IU] | Freq: Every day | SUBCUTANEOUS | Status: DC
  Administered 2014-09-23: 14 [IU] via SUBCUTANEOUS
  Filled 2014-09-22: qty 10

## 2014-09-22 MED ORDER — INSULIN ASPART 100 UNIT/ML ~~LOC~~ SOLN: 0.0000 [IU] | Freq: Every day | SUBCUTANEOUS | Status: DC

## 2014-09-22 MED ORDER — SODIUM CHLORIDE 0.9 % IV SOLN
20.0000 mL | INTRAVENOUS | Status: DC
  Administered 2014-09-22: 14:00:00 via INTRAVENOUS

## 2014-09-22 MED ORDER — GI COCKTAIL ~~LOC~~: 30.0000 mL | Freq: Four times a day (QID) | ORAL | Status: DC | PRN

## 2014-09-22 MED ORDER — ALLOPURINOL 300 MG PO TABS
300.0000 mg | ORAL_TABLET | Freq: Every day | ORAL | Status: DC
  Administered 2014-09-23 – 2014-09-24 (×2): 300 mg via ORAL
  Filled 2014-09-22 (×2): qty 1

## 2014-09-22 MED ORDER — SIMVASTATIN 40 MG PO TABS
40.0000 mg | ORAL_TABLET | Freq: Every day | ORAL | Status: DC
  Administered 2014-09-22 – 2014-09-24 (×3): 40 mg via ORAL
  Filled 2014-09-22 (×3): qty 1

## 2014-09-22 MED ORDER — ASPIRIN 81 MG PO CHEW: 324.0000 mg | CHEWABLE_TABLET | Freq: Once | ORAL | Status: DC

## 2014-09-22 MED ORDER — SODIUM CHLORIDE 0.9 % IV SOLN
INTRAVENOUS | Status: DC
  Administered 2014-09-22: 18:00:00 via INTRAVENOUS

## 2014-09-22 MED ORDER — ACETAMINOPHEN 325 MG PO TABS: 650.0000 mg | ORAL_TABLET | ORAL | Status: DC | PRN

## 2014-09-22 NOTE — Consult Note (Signed)
CARDIOLOGY CONSULT NOTE   Patient ID: Megan Walsh MRN: [id number] DOB/AGE: December 03, 1932 81 y.o.  Admit Date: 09/22/2014  Primary Physician: Neale Burly, MD  Primary Cardiologist    Followed  [facility]   Clinical Summary Megan Walsh is a 81 y.o.female. We have no outside information. There is a history of coronary disease and a pacemaker. She's been followed at [facility] She says that she has not been seen in the last year. Her primary physician is in [facility]. She had some chest discomfort at church today. She was transported to [facility]. Her first troponin is normal. Her EKG shows suggestion of intermittent pacing. The non-paced beats have diffuse T-wave changes. Her pain is completely resolved.   No Known Allergies  Medications Scheduled Medications: . allopurinol  300 mg Oral Daily  . aspirin  324 mg Oral Once  . insulin aspart  0-5 Units Subcutaneous QHS  . insulin aspart  0-9 Units Subcutaneous TID WC  . [START ON 09/23/2014] insulin aspart protamine- aspart  14 Units Subcutaneous Q breakfast  . simvastatin  40 mg Oral q1800  . traMADol  50 mg Oral TID     Infusions: . sodium chloride 20 mL/hr at 09/22/14 1358  . sodium chloride       PRN Medications:  nitroGLYCERIN   Past Medical History  Diagnosis Date  . Hypertension   . Diabetes mellitus without complication   . CHF (congestive heart failure)   . Fatty tumor     Past Surgical History  Procedure Laterality Date  . Pacemaker insertion    . Abdominal hysterectomy      Family history is positive for coronary disease.  Social History Megan Walsh reports that she has never smoked. She does not have any smokeless tobacco history on file. Megan Walsh reports that she does not drink alcohol.  Review of Systems Patient denies fever, chills, headache, sweats, rash, change in vision, change in hearing, cough, nausea or vomiting, urinary symptoms. All other systems are reviewed  and are negative.  Physical Examination Blood pressure 106/73, pulse 62, temperature 97.6 F (36.4 C), temperature source Oral, resp. rate 16, SpO2 100 %. No intake or output data in the 24 hours ending 09/22/14 1548  Patient is oriented to person time and place. Affect is normal. She is here with her son and her pastor. Head is atraumatic. Sclera and conjunctiva are normal. There is no jugular venous distention. Lungs are clear. Respiratory effort is not labored. Cardiac exam reveals S1 and S2. Abdomen is soft and there is no peripheral edema. There are no musculoskeletal deformities P there are no skin rashes. Neurologic is grossly intact.  Prior Cardiac Testing/Procedures  Lab Results  Basic Metabolic Panel:  Recent Labs Lab 09/22/14 1340 09/22/14 1353  NA 138 138  K 4.1 4.1  CL 100* 98*  CO2 28  --   GLUCOSE 158* 154*  BUN 36* 44*  CREATININE 1.30* 1.30*  CALCIUM 9.6  --     Liver Function Tests:  Recent Labs Lab 09/22/14 1340  AST 37  ALT 20  ALKPHOS 56  BILITOT 1.1  PROT 7.4  ALBUMIN 4.2    CBC:  Recent Labs Lab 09/22/14 1340 09/22/14 1353  WBC 3.8*  --   HGB 12.8 14.6  HCT 39.8 43.0  MCV 86.5  --   PLT 183  --     Cardiac Enzymes: No results for input(s): CKTOTAL, CKMB, CKMBINDEX, TROPONINI in the last 168 hours.  BNP: Invalid input(s): POCBNP   Radiology: Dg Chest Portable 1 View  09/22/2014   CLINICAL DATA:  Short of breath today  EXAM: PORTABLE CHEST - 1 VIEW  COMPARISON:  09/16/2010  FINDINGS: The heart is moderately enlarged. Normal vascularity. Clear lungs. Dual lead left subclavian pacemaker and leads are stable and intact. No pneumothorax. No pleural effusion.  IMPRESSION: No active disease.   Electronically Signed   By: Marybelle Killings M.D.   On: 09/22/2014 14:25     ECG: There are no old EKGs. I have reviewed the current EKG. There are wide complex beats that are probably paced beats. The non-paced beats have diffuse ST  changes.   Impression and Recommendations    Chest pain   CAD (coronary artery disease)     The patient noted that her chest discomfort today was similar to pain that she's had in the past. It is concerning for ischemic pain. Fortunately it is resolved and her first troponin is normal. EKG is difficult to assess as there is some pacing present. The plan will be to admit the patient and check troponins. It will be key to obtain prior outside information concerning her care. At this point I have not arranged for any further ischemic testing tomorrow until we have more information.      HTN (hypertension)   Diabetes mellitus type 2, insulin dependent    Pacemaker     We need outside information concerning her pacemaker.    History of CHF (congestive heart failure)     Volume status is stable.   Megan November, MD 09/22/2014, 3:48 PM

## 2014-09-22 NOTE — ED Provider Notes (Signed)
CSN: [id number]     Arrival date & time 09/22/14  1315 History   First MD Initiated Contact with Patient 09/22/14 1315     Chief Complaint  Patient presents with  . Chest Pain  . Weakness     (Consider location/radiation/quality/duration/timing/severity/associated sxs/prior Treatment) HPI 81 y.o. Female with history of cad, history of hypertension, and diabetes who presents today complaining of an episode at church where she began having some anterior chest pressure with sweating and some nausea. She rates the pain initially at an 8 out of 10. It was nonradiating. Pain decreased to a 4 after aspirin via EMS. They report not giving nitroglycerin due to systolic blood pressure of approximately 100. EMS was called from the church. She was transported directly here via [facility]. Prehospital EKG done shows atrial fibrillation with aberrant conduction. Past Medical History  Diagnosis Date  . Hypertension   . Diabetes mellitus without complication   . CHF (congestive heart failure)   . Fatty tumor    Past Surgical History  Procedure Laterality Date  . Pacemaker insertion    . Abdominal hysterectomy     No family history on file. History  Substance Use Topics  . Smoking status: Never Smoker   . Smokeless tobacco: Not on file  . Alcohol Use: No   OB History    No data available     Review of Systems  All other systems reviewed and are negative.     Allergies  Review of patient's allergies indicates no known allergies.  Home Medications   Prior to Admission medications   Not on File   BP 132/79 mmHg  Pulse 63  Temp(Src) 97.6 F (36.4 C) (Oral)  Resp 15  SpO2 100% Physical Exam  Constitutional: She is oriented to person, place, and time. She appears well-developed and well-nourished. No distress.  HENT:  Head: Normocephalic and atraumatic.  Right Ear: External ear normal.  Left Ear: External ear normal.  Nose: Nose normal.  Mouth/Throat: Oropharynx is  clear and moist.  Eyes: Conjunctivae and EOM are normal. Pupils are equal, round, and reactive to light.  Neck: Normal range of motion.  Cardiovascular: Normal rate, regular rhythm, normal heart sounds and intact distal pulses.   Pulmonary/Chest: Effort normal and breath sounds normal. No respiratory distress. She has no wheezes.  Abdominal: Soft. Bowel sounds are normal.  Musculoskeletal: Normal range of motion. She exhibits edema. She exhibits no tenderness.  Neurological: She is alert and oriented to person, place, and time. She has normal reflexes.  Skin: Skin is warm and dry.  Psychiatric: She has a normal mood and affect. Her behavior is normal. Judgment and thought content normal.  Nursing note and vitals reviewed.   ED Course  Procedures (including critical care time) Labs Review Labs Reviewed  APTT - Abnormal; Notable for the following:    aPTT 23 (*)    All other components within normal limits  CBC - Abnormal; Notable for the following:    WBC 3.8 (*)    All other components within normal limits  COMPREHENSIVE METABOLIC PANEL - Abnormal; Notable for the following:    Chloride 100 (*)    Glucose, Bld 158 (*)    BUN 36 (*)    Creatinine, Ser 1.30 (*)    GFR calc non Af Amer 38 (*)    GFR calc Af Amer 44 (*)    All other components within normal limits  I-STAT CHEM 8, ED - Abnormal; Notable  for the following:    Chloride 98 (*)    BUN 44 (*)    Creatinine, Ser 1.30 (*)    Glucose, Bld 154 (*)    All other components within normal limits  PROTIME-INR  URINALYSIS, ROUTINE W REFLEX MICROSCOPIC  I-STAT TROPOININ, ED    Imaging Review No results found. ED ECG REPORT   Date: 09/22/2014  Rate: 63  Rhythm: indeterminate  QRS Axis: indeterminate  Intervals: normal  ST/T Wave abnormalities: nonspecific ST/T changes  Conduction Disutrbances:nonspecific intraventricular conduction delay  Narrative Interpretation:   Old EKG Reviewed: none available  I have  personally reviewed the EKG tracing and agree with the computerized printout as noted.   MDM   Final diagnoses:  Chest pain, unspecified chest pain type  Abnormal EKG    81 y.o. Female with reported h.o. Of cad, s/p pacemaker, and congestive heart failure who presents today complaining of chest pain began just prior to transport. EKG shows regular rate of 63 with intraventricular induction delay and repolarization abnormality. She is pain-free here in emergency department after receiving aspirin prehospital. Initial troponin is normal. Plan admission for further evaluation.  Discussed with Ebony Hail for hospitalist and Dr.Katz on for cardiology.   Pattricia Boss, MD 09/22/14 [phone]

## 2014-09-22 NOTE — H&P (Signed)
[facility] History and Physical                                                                                    Megan Walsh, is a 81 y.o. female  MRN: [id number]   DOB - [birth year]  Admit Date - 09/22/2014  Outpatient Primary MD for the patient is Dr. Freida Busman?  Referring MD: Jeanell Sparrow / ER  Consulting MD: Ron Parker /Cardiology  With History of -  Past Medical History  Diagnosis Date  . Hypertension   . Diabetes mellitus without complication   . CHF (congestive heart failure)   . Fatty tumor       Past Surgical History  Procedure Laterality Date  . Pacemaker insertion    . Abdominal hysterectomy      in for   Chief Complaint  Patient presents with  . Chest Pain  . Weakness     HPI This is a 81 year old female patient with history of CAD and prior MI remotely, former tobacco abuse, hypertension, diabetes on insulin, pacemaker followed by EP at [facility], history of CHF, atrial fibrillation not on Coumadin (secondary to recurrent hematuria). Patient reports that today while at church she began having substernal chest pain that did not radiate but was associated with diaphoresis and a sensation of feeling hot. This chest pain lasted about an hour. Patient did receive an aspirin in route and was transported from [facility] via EMS. Her initial EKG demonstrated a left bundle branch block with underlying atrial fibrillation and unifocal PVCs as well as T-wave inversion in the lateral leads. No old EKGs for comparison. Initial troponin was normal. In further discussion with the patient she has noticed increasing fatigue as well as shortness of breath and dyspnea on exertion for about 1 month. No recent stress test. She also is complaining of right arm pain that occurs with movement of the right arm and was not associated with her chest pain.  Upon arrival to the ER in addition to the above clinical findings patient was afebrile, blood pressure  somewhat soft ranging between 114/57 and 106/73, heart rate 84, room air sats 90-100%. Electrolyte panel unremarkable except for BUN 44 and creatinine 1.3 with baseline renal function unknown. Glucose mildly elevated 154. Troponin 0.02. White count 3800 platelets normal 183,000, hemoglobin 12.8. Coags normal. Chest x-ray unremarkable.   Review of Systems   In addition to the HPI above,  No Fever-chills, myalgias or other constitutional symptoms No Headache, changes with Vision or hearing, new weakness, tingling, numbness in any extremity, No problems swallowing food or Liquids, indigestion/reflux No Cough, palpitations, orthopnea  No Abdominal pain, N/V; no melena or hematochezia, no dark tarry stools, Bowel movements are regular, No dysuria, hematuria or flank pain No new skin rashes, lesions, masses or bruises, No recent weight gain or loss No polyuria, polydypsia or polyphagia,  *A full 10 point Review of Systems was done, except as stated above, all other Review of Systems were negative.  Social History History  Substance Use Topics  . Smoking status: Never Smoker   . Smokeless tobacco: Not on file  . Alcohol Use: No  Resides at: Private residence  Lives with: Alone  Ambulatory status: Ambulatory without assistive devices prior to admission   Family History No family history on file. Discussed with patient and no pertinent family history regarding this admission noting patient has advanced age and has multiple risk factors as well as personal history of CAD.   Prior to Admission medications   Medication Sig Start Date End Date Taking? Authorizing Provider  allopurinol (ZYLOPRIM) 300 MG tablet Take 300 mg by mouth daily.   Yes Historical Provider, MD  furosemide (LASIX) 20 MG tablet Take 10 mg by mouth.   Yes Historical Provider, MD  hydrochlorothiazide (HYDRODIURIL) 25 MG tablet Take 25 mg by mouth daily.   Yes Historical Provider, MD  insulin aspart protamine- aspart  (NOVOLOG MIX 70/30) (70-30) 100 UNIT/ML injection Inject 14 Units into the skin daily as needed. Per sliding scale   Yes Historical Provider, MD  lisinopril (PRINIVIL,ZESTRIL) 20 MG tablet Take 20 mg by mouth daily.   Yes Historical Provider, MD  simvastatin (ZOCOR) 40 MG tablet Take 40 mg by mouth daily.   Yes Historical Provider, MD  traMADol (ULTRAM) 50 MG tablet Take 50 mg by mouth 3 (three) times daily.   Yes Historical Provider, MD    No Known Allergies  Physical Exam  Vitals  Blood pressure 106/73, pulse 62, temperature 97.6 F (36.4 C), temperature source Oral, resp. rate 16, SpO2 100 %.   General:  In no acute distress, appears healthy and well nourished  Psych:  Normal affect, Denies Suicidal or Homicidal ideations, Awake Alert, Oriented X 3. Speech and thought patterns are clear and appropriate, no apparent short term memory deficits  Neuro:   No focal neurological deficits, CN II through XII intact, Strength 5/5 all 4 extremities, Sensation intact all 4 extremities.  ENT:  Ears and Eyes appear Normal, Conjunctivae clear, PER. Moist oral mucosa without erythema or exudates.  Neck:  Supple, No lymphadenopathy appreciated  Respiratory:  Symmetrical chest wall movement, Good air movement bilaterally, CTAB. Room Air  Cardiac:  RRR, No Murmurs, no LE edema noted, no JVD, No carotid bruits, peripheral pulses palpable at 2+  Abdomen:  Positive bowel sounds, Soft, Non tender, Non distended,  No masses appreciated, no obvious hepatosplenomegaly  Skin:  No Cyanosis, Normal Skin Turgor, No Skin Rash or Bruise.  Extremities: Symmetrical without obvious trauma or injury,  no effusions.  Data Review  CBC  Recent Labs Lab 09/22/14 1340 09/22/14 1353  WBC 3.8*  --   HGB 12.8 14.6  HCT 39.8 43.0  PLT 183  --   MCV 86.5  --   MCH 27.8  --   MCHC 32.2  --   RDW 13.2  --     Chemistries   Recent Labs Lab 09/22/14 1340 09/22/14 1353  NA 138 138  K 4.1 4.1  CL 100*  98*  CO2 28  --   GLUCOSE 158* 154*  BUN 36* 44*  CREATININE 1.30* 1.30*  CALCIUM 9.6  --   AST 37  --   ALT 20  --   ALKPHOS 56  --   BILITOT 1.1  --     CrCl cannot be calculated (Unknown ideal weight.).  No results for input(s): TSH, T4TOTAL, T3FREE, THYROIDAB in the last 72 hours.  Invalid input(s): FREET3  Coagulation profile  Recent Labs Lab 09/22/14 1340  INR 1.09    No results for input(s): DDIMER in the last 72 hours.  Cardiac Enzymes No results for  input(s): CKMB, TROPONINI, MYOGLOBIN in the last 168 hours.  Invalid input(s): CK  Invalid input(s): POCBNP  Urinalysis No results found for: COLORURINE, APPEARANCEUR, LABSPEC, PHURINE, GLUCOSEU, HGBUR, BILIRUBINUR, KETONESUR, PROTEINUR, UROBILINOGEN, NITRITE, LEUKOCYTESUR  Imaging results:   Dg Chest Portable 1 View  09/22/2014   CLINICAL DATA:  Short of breath today  EXAM: PORTABLE CHEST - 1 VIEW  COMPARISON:  09/16/2010  FINDINGS: The heart is moderately enlarged. Normal vascularity. Clear lungs. Dual lead left subclavian pacemaker and leads are stable and intact. No pneumothorax. No pleural effusion.  IMPRESSION: No active disease.   Electronically Signed   By: Marybelle Killings M.D.   On: 09/22/2014 14:25     EKG: (Independently reviewed) wide complex underlying rhythm consistent with left bundle branch block-uncertain of chronic-atrial fibrillation, T-wave inversion lateral leads, QTC 488 ms-T wave inversions as well as bundle branch block could be indicative of acute ischemic changes but given patient's history of pacemaker and no prior EKGs for comparison these may also be chronic conduction abnormalities for this patient   Assessment & Plan  Principal Problem:   Chest pain/known coronary artery disease -Admit to 3 west telemetry-redevelops chest pain will be able to easily transitioned to a stepdown bed -Consult cardiology -Heart score equals 7 -Given full dose aspirin in ER and will continue -Have not  ordered antihypertensives and will not order beta blocker given soft blood pressures -Cycle cardiac isoenzymes -If redevelops chest pain will likely need to begin heparin infusion and either nitroglycerin ointment or IV nitroglycerin -Suspect may benefit from Myoview stress test this admission -Check lipid panel  Active Problems:   Acute renal failure -Baseline renal function unknown -Hold ACE inhibitor and diuretic -Gentle IV fluid hydration    Atrial fibrillation with controlled ventricular response -Currently rate controlled and was not on rate controlling agents prior to admission -CHADVASc = 7    Diabetes mellitus type 2, insulin dependent -Resume preadmission 70/30 insulin -Follow CBGs and provide sliding scale insulin    Pacemaker -Reviewed records from [facility]-as DDIR pacer in but no documentation regarding rationale for insertion    HTN  -Current blood pressure soft -Hold antihypertensive medications and diuretics    History of CHF -Reported as history but no prior echo to clarify etiology -Echo ordered this admission -ACE inhibitor and diuretic on hold as above    Gout -No acute symptoms -Continue allopurinol    DVT Prophylaxis: Subcutaneous heparin  Family Communication:  No family at bedside  Code Status:  Full code  Condition:  Stable  Discharge disposition: Anticipate will return to home pending cardiac evaluation  Time spent in minutes : 60      ELLIS,ALLISON L. ANP on 09/22/2014 at 3:39 PM  Between 7am to 7pm - Pager - [phone]  After 7pm go to [email or web address] - password [email or web address]  And look for the night coverage person covering me after hours  [facility]

## 2014-09-22 NOTE — ED Notes (Signed)
Received pt via EMS from church with c/o chest pain and weakness.

## 2014-09-22 NOTE — ED Notes (Signed)
Pt reports non radiating midsternal chest pressure during church. Pt also reports SOB and generalized weakness. Denies chest pain at the time. Lung sounds clear and equal bilaterally. Skin warm and dry. Pt is alert and oriented x4.

## 2014-09-23 ENCOUNTER — Ambulatory Visit (INDEPENDENT_AMBULATORY_CARE_PROVIDER_SITE_OTHER): Payer: Medicare Other

## 2014-09-23 ENCOUNTER — Encounter (HOSPITAL_COMMUNITY): Payer: Self-pay | Admitting: Physician Assistant

## 2014-09-23 DIAGNOSIS — R0789 Other chest pain: Secondary | ICD-10-CM | POA: Diagnosis not present

## 2014-09-23 DIAGNOSIS — R9431 Abnormal electrocardiogram [ECG] [EKG]: Secondary | ICD-10-CM | POA: Diagnosis not present

## 2014-09-23 DIAGNOSIS — I4891 Unspecified atrial fibrillation: Secondary | ICD-10-CM | POA: Diagnosis not present

## 2014-09-23 DIAGNOSIS — I25118 Atherosclerotic heart disease of native coronary artery with other forms of angina pectoris: Secondary | ICD-10-CM | POA: Diagnosis not present

## 2014-09-23 DIAGNOSIS — R079 Chest pain, unspecified: Secondary | ICD-10-CM | POA: Diagnosis not present

## 2014-09-23 LAB — URINALYSIS, ROUTINE W REFLEX MICROSCOPIC
Bilirubin Urine: NEGATIVE
Glucose, UA: NEGATIVE mg/dL
Hgb urine dipstick: NEGATIVE
KETONES UR: NEGATIVE mg/dL
NITRITE: NEGATIVE
Protein, ur: NEGATIVE mg/dL
Specific Gravity, Urine: 1.016 (ref 1.005–1.030)
Urobilinogen, UA: 0.2 mg/dL (ref 0.0–1.0)
pH: 6.5 (ref 5.0–8.0)

## 2014-09-23 LAB — URINE MICROSCOPIC-ADD ON

## 2014-09-23 LAB — GLUCOSE, CAPILLARY
GLUCOSE-CAPILLARY: 112 mg/dL — AB (ref 65–99)
GLUCOSE-CAPILLARY: 51 mg/dL — AB (ref 65–99)
GLUCOSE-CAPILLARY: 111 mg/dL — AB (ref 65–99)
Glucose-Capillary: 125 mg/dL — ABNORMAL HIGH (ref 65–99)
Glucose-Capillary: 63 mg/dL — ABNORMAL LOW (ref 65–99)
Glucose-Capillary: 114 mg/dL — ABNORMAL HIGH (ref 65–99)

## 2014-09-23 LAB — LIPID PANEL
CHOL/HDL RATIO: 2 ratio
CHOLESTEROL: 124 mg/dL (ref 0–200)
HDL: 62 mg/dL (ref >40)
LDL Cholesterol: 48 mg/dL (ref 0–99)
Triglycerides: 69 mg/dL (ref <150)
VLDL: 14 mg/dL (ref 0–40)

## 2014-09-23 LAB — HEMOGLOBIN A1C
HEMOGLOBIN A1C: 6.7 % — AB (ref 4.8–5.6)
MEAN PLASMA GLUCOSE: 146 mg/dL

## 2014-09-23 LAB — TROPONIN I: Troponin I: <0.03 ng/mL (ref <0.031)

## 2014-09-23 NOTE — Progress Notes (Signed)
Device interrogated by Tomi Bamberger. Pt has been in atrial fib since device last interrogated 2 years ago. She had 1 high heart rate episode, downloaded and put in paper chart.  HR > 170, most likely rapid atrial fib.  Rosaria Ferries, Hershal Coria 09/23/2014 3:21 PM Beeper [phone]

## 2014-09-23 NOTE — Progress Notes (Signed)
[facility] PROGRESS NOTE  Megan Walsh KKX:[id number] DOB: 1932/06/06 DOA: 09/22/2014 PCP: No primary care provider on file.  Assessment/Plan: 1. Chest pain -Patient presenting with complaints of chest pain associated shortness of breath, initial workup unremarkable. EKG did not show acute ischemic changes, troponins remain negative 3 sets, chest x-ray did not reveal evidence of pulmonary edema. -Cardiology consulted recommending pharmacologic stress test. -Continue antiplatelet therapy with aspirin 325 mg by mouth daily along with simvastatin 40 mg by mouth daily.  2.  Atrial fibrillation having a CHADVasc score of 5 -Remains rate controlled, currently not on nodal agents -Patient has not on anticoagulation, has risk for thromboembolism and could benefit from anticoagulation.  -Will discuss further with cardiology  3.  Insulin dependent diabetes mellitus -Continue NovoLog 70/3014 units subcutaneous daily, along with accuchecks ac and hs  4.  Dyslipidemia -Continue statin therapy with Zocor  Code Status: Full code Family Communication: Family not present Disposition Plan: Plan for stress test in a.m.   Consultants:  Cardiology   HPI/Subjective: Patient is a pleasant 81 year old female with a past medical history of atrial fibrillation, hypertension, history of coronary artery disease status post myocardial infarction who presented to the emergency department on 09/22/2014 with complaints of shortness of breath associated with chest discomfort. Initial workup included a chest x-ray which did not show active cardiac pulmonary disease. She was admitted to the medicine service where troponins were cycled and remained negative 3 sets. Her device was interrogated, it appears she had been in A. fib since device was last interrogated 2 years ago. Cardiology was consulted recommending pharmacologic stress test in a.m.  Objective: Filed Vitals:   09/23/14 1317  BP: 114/52   Pulse: 60  Temp: 97.5 F (36.4 C)  Resp: 18    Intake/Output Summary (Last 24 hours) at 09/23/14 1631 Last data filed at 09/23/14 1300  Gross per 24 hour  Intake    840 ml  Output   1000 ml  Net   -160 ml   Filed Weights   09/23/14 0410  Weight: 81.375 kg (179 lb 6.4 oz)    Exam:   General:  No acute distress, awake and alert oriented  Cardiovascular: Regular rate and rhythm normal S1-S2 no murmurs rubs gallops  Respiratory: Normal respiratory effort, lungs are clear to auscultation bilaterally  Abdomen: Soft nontender nondistended  Musculoskeletal: Has 1-2+ bilateral ankle edema   Data Reviewed: Basic Metabolic Panel:  Recent Labs Lab 09/22/14 1340 09/22/14 1353  NA 138 138  K 4.1 4.1  CL 100* 98*  CO2 28  --   GLUCOSE 158* 154*  BUN 36* 44*  CREATININE 1.30* 1.30*  CALCIUM 9.6  --    Liver Function Tests:  Recent Labs Lab 09/22/14 1340  AST 37  ALT 20  ALKPHOS 56  BILITOT 1.1  PROT 7.4  ALBUMIN 4.2   No results for input(s): LIPASE, AMYLASE in the last 168 hours. No results for input(s): AMMONIA in the last 168 hours. CBC:  Recent Labs Lab 09/22/14 1340 09/22/14 1353  WBC 3.8*  --   HGB 12.8 14.6  HCT 39.8 43.0  MCV 86.5  --   PLT 183  --    Cardiac Enzymes:  Recent Labs Lab 09/22/14 2008 09/23/14 0223 09/23/14 0731  TROPONINI <0.03 <0.03 <0.03   BNP (last 3 results) No results for input(s): BNP in the last 8760 hours.  ProBNP (last 3 results) No results for input(s): PROBNP in the last 8760 hours.  CBG:  Recent Labs Lab 09/22/14 2108 09/23/14 0757 09/23/14 1130 09/23/14 1545 09/23/14 1606  GLUCAP 159* 112* 125* 51* 63*    No results found for this or any previous visit (from the past 240 hour(s)).   Studies: Dg Chest Portable 1 View  09/22/2014   CLINICAL DATA:  Short of breath today  EXAM: PORTABLE CHEST - 1 VIEW  COMPARISON:  09/16/2010  FINDINGS: The heart is moderately enlarged. Normal vascularity. Clear  lungs. Dual lead left subclavian pacemaker and leads are stable and intact. No pneumothorax. No pleural effusion.  IMPRESSION: No active disease.   Electronically Signed   By: Marybelle Killings M.D.   On: 09/22/2014 14:25    Scheduled Meds: . allopurinol  300 mg Oral Daily  . aspirin  324 mg Oral Once  . heparin  5,000 Units Subcutaneous 3 times per day  . insulin aspart  0-5 Units Subcutaneous QHS  . insulin aspart  0-9 Units Subcutaneous TID WC  . insulin aspart protamine- aspart  14 Units Subcutaneous Q breakfast  . simvastatin  40 mg Oral q1800  . traMADol  50 mg Oral TID   Continuous Infusions: . sodium chloride 20 mL/hr at 09/22/14 1358    Principal Problem:   Chest pain Active Problems:   CAD (coronary artery disease)   Atrial fibrillation with controlled ventricular response   HTN (hypertension)   Diabetes mellitus type 2, insulin dependent   Pacemaker - MDT ADDR01 Implanted: 05/31/08 Serial# [id number]   History of CHF (congestive heart failure)   Acute renal failure   Gout   Abnormal EKG    Time spent: 25 min   Kelvin Cellar  [facility] Pager [phone]. If 7PM-7AM, please contact night-coverage at [email or web address], password [facility] 09/23/2014, 4:31 PM  LOS: 1 day

## 2014-09-23 NOTE — Progress Notes (Signed)
UR Completed Jakaria Lavergne Graves-Bigelow, RN,BSN [phone]  

## 2014-09-23 NOTE — Progress Notes (Signed)
  Echocardiogram 2D Echocardiogram has been performed.  Megan Walsh M 09/23/2014, 11:29 AM

## 2014-09-23 NOTE — Progress Notes (Addendum)
Patient Name: Megan Walsh Date of Encounter: 09/23/2014  Principal Problem:   Chest pain Active Problems:   CAD (coronary artery disease)   Atrial fibrillation with controlled ventricular response   HTN (hypertension)   Diabetes mellitus type 2, insulin dependent   Pacemaker   History of CHF (congestive heart failure)   Acute renal failure   Gout   Abnormal EKG   Primary Cardiologist: [facility]   Patient Profile: 81 yo female w/ hx SSS/CHB/afib and MDT PPM, HTN, DM, CHF (ever since age 57 when son was born, hospitalized at [facility] w/in 2 years), ?CAD (may have had cath, no stent) was admitted 05/15 with chest pain.   SUBJECTIVE: Chest pain started when she became hypoglycemic during church, resolved after arrival here. Gets chest pain w/ exertion at times, lays down and it goes away.  OBJECTIVE Filed Vitals:   09/22/14 2000 09/23/14 0017 09/23/14 0410 09/23/14 0758  BP: 118/43 96/51 104/55 98/62  Pulse: 66 61 60 61  Temp: 97.5 F (36.4 C) 97.8 F (36.6 C) 98 F (36.7 C) 97.6 F (36.4 C)  TempSrc: Oral Oral Oral Oral  Resp: 18 18 18 16   Height:   5\' 5"  (1.651 m)   Weight:   179 lb 6.4 oz (81.375 kg)   SpO2: 100% 100% 99% 98%    Intake/Output Summary (Last 24 hours) at 09/23/14 0816 Last data filed at 09/23/14 0500  Gross per 24 hour  Intake    480 ml  Output    850 ml  Net   -370 ml   Filed Weights   09/23/14 0410  Weight: 179 lb 6.4 oz (81.375 kg)    PHYSICAL EXAM General: Well developed, well nourished, female in no acute distress. Head: Normocephalic, atraumatic.  Neck: Supple without bruits, JVD not elevated Lungs:  Resp regular and unlabored, CTA. Heart: RRR, S1, S2, no S3, S4, or murmur; no rub. Abdomen: Soft, non-tender, non-distended, BS + x 4.  Extremities: No clubbing, cyanosis, no edema.  Neuro: Alert and oriented X 3. Moves all extremities spontaneously. Psych: Normal affect.  LABS: CBC: Recent Labs  09/22/14 1340 09/22/14 1353  WBC  3.8*  --   HGB 12.8 14.6  HCT 39.8 43.0  MCV 86.5  --   PLT 183  --    INR: Recent Labs  09/22/14 1340  INR 3.54   Basic Metabolic Panel: Recent Labs  09/22/14 1340 09/22/14 1353  NA 138 138  K 4.1 4.1  CL 100* 98*  CO2 28  --   GLUCOSE 158* 154*  BUN 36* 44*  CREATININE 1.30* 1.30*  CALCIUM 9.6  --    Liver Function Tests: Recent Labs  09/22/14 1340  AST 37  ALT 20  ALKPHOS 56  BILITOT 1.1  PROT 7.4  ALBUMIN 4.2   Cardiac Enzymes: Recent Labs  09/22/14 2008 09/23/14 0223  TROPONINI <0.03 <0.03    Recent Labs  09/22/14 1350  TROPIPOC 0.02   Fasting Lipid Panel: Recent Labs  09/23/14 0223  CHOL 124  HDL 62  LDLCALC 48  TRIG 69  CHOLHDL 2.0    TELE:    Radiology/Studies: Dg Chest Portable 1 View 09/22/2014   CLINICAL DATA:  Short of breath today  EXAM: PORTABLE CHEST - 1 VIEW  COMPARISON:  09/16/2010  FINDINGS: The heart is moderately enlarged. Normal vascularity. Clear lungs. Dual lead left subclavian pacemaker and leads are stable and intact. No pneumothorax. No pleural effusion.  IMPRESSION: No active  disease.   Electronically Signed   By: Marybelle Killings M.D.   On: 09/22/2014 14:25     Current Medications:  . allopurinol  300 mg Oral Daily  . aspirin  324 mg Oral Once  . heparin  5,000 Units Subcutaneous 3 times per day  . insulin aspart  0-5 Units Subcutaneous QHS  . insulin aspart  0-9 Units Subcutaneous TID WC  . insulin aspart protamine- aspart  14 Units Subcutaneous Q breakfast  . simvastatin  40 mg Oral q1800  . traMADol  50 mg Oral TID   . sodium chloride 20 mL/hr at 09/22/14 1358    ASSESSMENT AND PLAN: Principal Problem:   Chest pain - ez neg MI so far - has hx exertional chest pain relieved by rest -   Active Problems:   CAD (coronary artery disease) - amount unclear, no recent ischemic eval - doesn't remember any stents, thinks she had cath - no cath/stress test in Care Everywhere    Atrial fibrillation with  controlled ventricular response -     HTN (hypertension) - per IM, good control now    Diabetes mellitus type 2, insulin dependent - per IM    Pacemaker - [employer]    History of CHF (congestive heart failure) - no acute exacerbation, pt describes daytime LE edema - ck echo    Acute renal failure - need Dr Joselyn Arrow records or [facility] records to establish baseline - Lasix 20 mg, HCTZ 25 mg and lisinopril 20 mg are on hold    Gout - per IM    Abnormal EKG - no old, artifact, may be paced    Hyperlipidemia - Zocor 40 mg is PTA rx  Signed, Barrett, Rhonda , PA-C 8:16 AM 09/23/2014   She has ruled out for MI. She says she is not always in atrial fib. Says she was taken off aspirin because it was hurting her kidneys. We will interrogate device to see if this helps determine duration of AF. We will feed her today, try to get information from [facility] and or PCP. Will get pharmacologic stress test tomorrow. We also need to decide about anticoagulation.

## 2014-09-24 ENCOUNTER — Inpatient Hospital Stay (HOSPITAL_COMMUNITY): Payer: Medicare Other

## 2014-09-24 DIAGNOSIS — I10 Essential (primary) hypertension: Secondary | ICD-10-CM

## 2014-09-24 DIAGNOSIS — R079 Chest pain, unspecified: Secondary | ICD-10-CM | POA: Diagnosis not present

## 2014-09-24 DIAGNOSIS — I25118 Atherosclerotic heart disease of native coronary artery with other forms of angina pectoris: Secondary | ICD-10-CM | POA: Diagnosis not present

## 2014-09-24 DIAGNOSIS — R0789 Other chest pain: Secondary | ICD-10-CM

## 2014-09-24 DIAGNOSIS — E119 Type 2 diabetes mellitus without complications: Secondary | ICD-10-CM | POA: Diagnosis not present

## 2014-09-24 DIAGNOSIS — R072 Precordial pain: Secondary | ICD-10-CM | POA: Diagnosis not present

## 2014-09-24 DIAGNOSIS — I4891 Unspecified atrial fibrillation: Secondary | ICD-10-CM | POA: Diagnosis not present

## 2014-09-24 LAB — GLUCOSE, CAPILLARY
GLUCOSE-CAPILLARY: 141 mg/dL — AB (ref 65–99)
Glucose-Capillary: 124 mg/dL — ABNORMAL HIGH (ref 65–99)

## 2014-09-24 LAB — CBC
HCT: 34.9 % — ABNORMAL LOW (ref 36.0–46.0)
HEMOGLOBIN: 11.1 g/dL — AB (ref 12.0–15.0)
MCH: 27.7 pg (ref 26.0–34.0)
MCHC: 31.8 g/dL (ref 30.0–36.0)
MCV: 87 fL (ref 78.0–100.0)
Platelets: 162 10*3/uL (ref 150–400)
RBC: 4.01 MIL/uL (ref 3.87–5.11)
RDW: 13.3 % (ref 11.5–15.5)
WBC: 2.7 10*3/uL — ABNORMAL LOW (ref 4.0–10.5)

## 2014-09-24 LAB — BASIC METABOLIC PANEL
ANION GAP: 7 (ref 5–15)
BUN: 28 mg/dL — AB (ref 6–20)
CO2: 25 mmol/L (ref 22–32)
CREATININE: 1.01 mg/dL — AB (ref 0.44–1.00)
Calcium: 8.5 mg/dL — ABNORMAL LOW (ref 8.9–10.3)
Chloride: 104 mmol/L (ref 101–111)
GFR, EST AFRICAN AMERICAN: 59 mL/min — AB (ref >60)
GFR, EST NON AFRICAN AMERICAN: 51 mL/min — AB (ref >60)
Glucose, Bld: 109 mg/dL — ABNORMAL HIGH (ref 65–99)
Potassium: 4.2 mmol/L (ref 3.5–5.1)
Sodium: 136 mmol/L (ref 135–145)

## 2014-09-24 LAB — HEMOGLOBIN A1C
Hgb A1c MFr Bld: 6.8 % — ABNORMAL HIGH (ref 4.8–5.6)
Mean Plasma Glucose: 148 mg/dL

## 2014-09-24 MED ORDER — REGADENOSON 0.4 MG/5ML IV SOLN
0.4000 mg | Freq: Once | INTRAVENOUS | Status: AC
  Administered 2014-09-24: 0.4 mg via INTRAVENOUS
  Filled 2014-09-24: qty 5

## 2014-09-24 MED ORDER — REGADENOSON 0.4 MG/5ML IV SOLN
INTRAVENOUS | Status: AC
  Filled 2014-09-24: qty 5

## 2014-09-24 MED ORDER — ASPIRIN 81 MG PO TABS: 81.0000 mg | ORAL_TABLET | Freq: Every day | ORAL | Status: DC

## 2014-09-24 MED ORDER — TECHNETIUM TC 99M SESTAMIBI GENERIC - CARDIOLITE
30.0000 | Freq: Once | INTRAVENOUS | Status: AC | PRN
  Administered 2014-09-24: 30 via INTRAVENOUS

## 2014-09-24 MED ORDER — TECHNETIUM TC 99M SESTAMIBI GENERIC - CARDIOLITE
10.0000 | Freq: Once | INTRAVENOUS | Status: AC | PRN
  Administered 2014-09-24: 10 via INTRAVENOUS

## 2014-09-24 NOTE — Discharge Summary (Signed)
Physician Discharge Summary  Megan Walsh UXL:[id number] DOB: 03-15-1933 DOA: 09/22/2014  PCP: No primary care provider on file.  Admit date: 09/22/2014 Discharge date: 09/24/2014  Time spent: >35 minutes  Recommendations for Outpatient Follow-up:  F/u with cardiology in 1-2 weeks  F/u with PCP in 3-7 days as needed   Discharge Diagnoses:  Principal Problem:   Midsternal chest pain Active Problems:   CAD (coronary artery disease)   Atrial fibrillation with controlled ventricular response   HTN (hypertension)   Diabetes mellitus type 2, insulin dependent   Pacemaker - MDT ADDR01 Implanted: 05/31/08 Serial# [id number]   History of CHF (congestive heart failure)   Acute renal failure   Gout   Abnormal EKG   Discharge Condition: stable   Diet recommendation: low sodium. DM  Filed Weights   09/23/14 0410 09/24/14 0430  Weight: 81.375 kg (179 lb 6.4 oz) 82.6 kg (182 lb 1.6 oz)    History of present illness:  81 y/o female with PMH of HTN, DM, CHF, PAF (not on anticoagulation due to hematuria), s/p PPM, presented with chest pains   Hospital Course:  1. Chest pains, atypical. Trop: negative. ECG: paced. Pt denies acute cardiopulmonary symptoms -patient underwent stress test which showed: No scintigraphic evidence of pharmacologically induced ischemia. Patient is seen by cardiology, who recommended to f/u with her primary cardiologist. added ASA, cont statin. Not on BB.  2. H/o PAF not on anticoagulation due to h/o hematuria. Cardiology is seen the patient, and recommended to f/u outpatient with her primary cardiologist to discuss anticoagulation options.  3. H/o CHF. Clinically euvolemic. Cont home regimen  4. DM. ha1c-6.8. Cont insulin regimen.   Procedures:  Echo: Study Conclusions  - Left ventricle: The cavity size was normal. There was severe asymmetric hypertrophy of the septum. Systolic function was normal. The estimated ejection fraction was in the range of  60% to 65%. Wall motion was normal; there were no regional wall motion abnormalities. - Aortic valve: There was mild regurgitation. - Mitral valve: There was mild regurgitation directed centrally. - Left atrium: The atrium was moderately to severely dilated. - Tricuspid valve: There was moderate regurgitation. - Pulmonary arteries: Systolic pressure was mildly increased. PA peak pressure: 41 mm Hg (S).  (i.e. Studies not automatically included, echos, thoracentesis, etc; not x-rays)  Consultations:  Cardiology   Discharge Exam: Filed Vitals:   09/24/14 1015  BP: 122/52  Pulse:   Temp:   Resp:     General: alert, oriented  Cardiovascular: s1,s2 rrr Respiratory: CTA BL  Discharge Instructions  Discharge Instructions    Diet - low sodium heart healthy    Complete by:  As directed      Discharge instructions    Complete by:  As directed   Please follow up with cardiology in 1-2 weeks     Increase activity slowly    Complete by:  As directed             Medication List    TAKE these medications        allopurinol 300 MG tablet  Commonly known as:  ZYLOPRIM  Take 300 mg by mouth daily.     aspirin 81 MG tablet  Take 1 tablet (81 mg total) by mouth daily.     furosemide 20 MG tablet  Commonly known as:  LASIX  Take 10 mg by mouth.     hydrochlorothiazide 25 MG tablet  Commonly known as:  HYDRODIURIL  Take 25 mg by mouth daily.  insulin aspart protamine- aspart (70-30) 100 UNIT/ML injection  Commonly known as:  NOVOLOG MIX 70/30  Inject 14 Units into the skin daily as needed. Per sliding scale     lisinopril 20 MG tablet  Commonly known as:  PRINIVIL,ZESTRIL  Take 20 mg by mouth daily.     simvastatin 40 MG tablet  Commonly known as:  ZOCOR  Take 40 mg by mouth daily.     traMADol 50 MG tablet  Commonly known as:  ULTRAM  Take 50 mg by mouth 3 (three) times daily.       No Known Allergies     Follow-up Information    Follow up with  Sinclair Grooms, MD.   Specialty:  Cardiology   Contact information:   [address] [place] [address] [phone]        The results of significant diagnostics from this hospitalization (including imaging, microbiology, ancillary and laboratory) are listed below for reference.    Significant Diagnostic Studies: Nm Myocar Multi W/spect W/wall Motion / Ef  09/24/2014   CLINICAL DATA:  Chest pain. History of atrial fibrillation, diabetes, hypertension, CAD, CHF acute renal failure.  EXAM: MYOCARDIAL IMAGING WITH SPECT (REST AND PHARMACOLOGIC-STRESS)  GATED LEFT VENTRICULAR WALL MOTION STUDY  LEFT VENTRICULAR EJECTION FRACTION  TECHNIQUE: Standard myocardial SPECT imaging was performed after resting intravenous injection of 10 mCi Tc-3m sestamibi. Subsequently, intravenous infusion of Lexiscan was performed under the supervision of the Cardiology staff. At peak effect of the drug, 30 mCi Tc-66m sestamibi was injected intravenously and standard myocardial SPECT imaging was performed. Quantitative gated imaging was also performed to evaluate left ventricular wall motion, and estimate left ventricular ejection fraction.  COMPARISON:  Chest radiograph- 09/22/2014  FINDINGS: Raw images: There is mild patient motion artifact, worse in the provided rest images. Mild to moderate breast and chest wall attenuation is seen on both the provided rest and stress images. There is mild GI attenuation seen on both the provided rest and stress images.  Perfusion: There is a minimal amount of attenuation involving the apical aspect of the anterior wall of the left ventricle which improves though incompletely resolves on the provided stress images and is associated with regional wall motion area of relative akinesia and as such is worrisome for an area of prior infarction. No scintigraphic evidence of pharmacologically induced ischemia  Wall Motion: Small geographic area of relative hypokinesia  involving the suspected area prior infarction involving the apical aspect of the anterior wall of left ventricle. Otherwise, normal wall motion.  Left Ventricular Ejection Fraction: 53 %  End diastolic volume 67 ml  End systolic volume 31 ml  IMPRESSION: 1. Small area of matched non perfusion involving the apical aspect of the anterior wall of the left ventricle with associated regional wall motion abnormality, findings compatible with the sequela of prior small territory infarction. 2. No scintigraphic evidence of pharmacologically induced ischemia. 3. Mild hypokinesia involving the suspected area of prior infarction involving the apical aspect of the anterior wall of the left ventricle. Otherwise, normal wall motion. Ejection fraction - 53%.   Electronically Signed   By: Sandi Mariscal M.D.   On: 09/24/2014 15:38   Dg Chest Portable 1 View  09/22/2014   CLINICAL DATA:  Short of breath today  EXAM: PORTABLE CHEST - 1 VIEW  COMPARISON:  09/16/2010  FINDINGS: The heart is moderately enlarged. Normal vascularity. Clear lungs. Dual lead left subclavian pacemaker and leads are stable and intact. No pneumothorax.  No pleural effusion.  IMPRESSION: No active disease.   Electronically Signed   By: Marybelle Killings M.D.   On: 09/22/2014 14:25    Microbiology: No results found for this or any previous visit (from the past 240 hour(s)).   Labs: Basic Metabolic Panel:  Recent Labs Lab 09/22/14 1340 09/22/14 1353 09/24/14 0528  NA 138 138 136  K 4.1 4.1 4.2  CL 100* 98* 104  CO2 28  --  25  GLUCOSE 158* 154* 109*  BUN 36* 44* 28*  CREATININE 1.30* 1.30* 1.01*  CALCIUM 9.6  --  8.5*   Liver Function Tests:  Recent Labs Lab 09/22/14 1340  AST 37  ALT 20  ALKPHOS 56  BILITOT 1.1  PROT 7.4  ALBUMIN 4.2   No results for input(s): LIPASE, AMYLASE in the last 168 hours. No results for input(s): AMMONIA in the last 168 hours. CBC:  Recent Labs Lab 09/22/14 1340 09/22/14 1353 09/24/14 0528  WBC  3.8*  --  2.7*  HGB 12.8 14.6 11.1*  HCT 39.8 43.0 34.9*  MCV 86.5  --  87.0  PLT 183  --  162   Cardiac Enzymes:  Recent Labs Lab 09/22/14 2008 09/23/14 0223 09/23/14 0731  TROPONINI <0.03 <0.03 <0.03   BNP: BNP (last 3 results) No results for input(s): BNP in the last 8760 hours.  ProBNP (last 3 results) No results for input(s): PROBNP in the last 8760 hours.  CBG:  Recent Labs Lab 09/23/14 1545 09/23/14 1606 09/23/14 1659 09/23/14 2043 09/24/14 1126  GLUCAP 51* 63* 111* 114* 141*       Signed:  Dillinger Aston N  [facility] 09/24/2014, 3:47 PM

## 2014-09-24 NOTE — Progress Notes (Signed)
Patient Name: Megan Walsh Date of Encounter: 09/24/2014     Principal Problem:   Midsternal chest pain Active Problems:   CAD (coronary artery disease)   HTN (hypertension)   Diabetes mellitus type 2, insulin dependent   Atrial fibrillation with controlled ventricular response   Pacemaker - MDT ADDR01 Implanted: 05/31/08 Serial# [id number]   History of CHF (congestive heart failure)   Acute renal failure   Gout   Abnormal EKG    SUBJECTIVE  No c/p or dyspnea.  For MV today.  CURRENT MEDS . allopurinol  300 mg Oral Daily  . aspirin  324 mg Oral Once  . heparin  5,000 Units Subcutaneous 3 times per day  . insulin aspart  0-5 Units Subcutaneous QHS  . insulin aspart  0-9 Units Subcutaneous TID WC  . regadenoson      . simvastatin  40 mg Oral q1800  . traMADol  50 mg Oral TID    OBJECTIVE  Filed Vitals:   09/24/14 0430 09/24/14 0949 09/24/14 1011 09/24/14 1013  BP: 105/57 132/58 122/45 119/50  Pulse: 65     Temp: 98.3 F (36.8 C)     TempSrc: Oral     Resp:      Height:      Weight: 182 lb 1.6 oz (82.6 kg)     SpO2: 97%       Intake/Output Summary (Last 24 hours) at 09/24/14 1015 Last data filed at 09/23/14 2046  Gross per 24 hour  Intake    540 ml  Output    450 ml  Net     90 ml   Filed Weights   09/23/14 0410 09/24/14 0430  Weight: 179 lb 6.4 oz (81.375 kg) 182 lb 1.6 oz (82.6 kg)    PHYSICAL EXAM  General: Pleasant, NAD. Neuro: Alert and oriented X 3. Moves all extremities spontaneously. Psych: Normal affect. HEENT:  Normal  Neck: Supple without bruits or JVD. Lungs:  Resp regular and unlabored, CTA. Heart: RRR no s3, s4, or murmurs. Abdomen: Soft, non-tender, non-distended, BS + x 4.  Extremities: No clubbing, cyanosis.  Trace, nonpitting edema. DP/PT/Radials 2+ and equal bilaterally.  Accessory Clinical Findings  CBC  Recent Labs  09/22/14 1340 09/22/14 1353 09/24/14 0528  WBC 3.8*  --  2.7*  HGB 12.8 14.6 11.1*  HCT 39.8  43.0 34.9*  MCV 86.5  --  87.0  PLT 183  --  409   Basic Metabolic Panel  Recent Labs  09/22/14 1340 09/22/14 1353 09/24/14 0528  NA 138 138 136  K 4.1 4.1 4.2  CL 100* 98* 104  CO2 28  --  25  GLUCOSE 158* 154* 109*  BUN 36* 44* 28*  CREATININE 1.30* 1.30* 1.01*  CALCIUM 9.6  --  8.5*   Liver Function Tests  Recent Labs  09/22/14 1340  AST 37  ALT 20  ALKPHOS 56  BILITOT 1.1  PROT 7.4  ALBUMIN 4.2   Cardiac Enzymes  Recent Labs  09/22/14 2008 09/23/14 0223 09/23/14 0731  TROPONINI <0.03 <0.03 <0.03   Hemoglobin A1C  Recent Labs  09/23/14 0223  HGBA1C 6.8*   Fasting Lipid Panel  Recent Labs  09/23/14 0223  CHOL 124  HDL 62  LDLCALC 48  TRIG 69  CHOLHDL 2.0   TELE  Seen in nuc med.  Radiology/Studies  Dg Chest Portable 1 View  09/22/2014   CLINICAL DATA:  Short of breath today  EXAM: PORTABLE CHEST - 1 VIEW  COMPARISON:  09/16/2010  FINDINGS: The heart is moderately enlarged. Normal vascularity. Clear lungs. Dual lead left subclavian pacemaker and leads are stable and intact. No pneumothorax. No pleural effusion.  IMPRESSION: No active disease.   Electronically Signed   By: Marybelle Killings M.D.   On: 09/22/2014 14:25   2D Echocardiogram 5.16.2016  Study Conclusions  - Left ventricle: The cavity size was normal. There was severe   asymmetric hypertrophy of the septum. Systolic function was   normal. The estimated ejection fraction was in the range of 60%   to 65%. Wall motion was normal; there were no regional wall   motion abnormalities. - Aortic valve: There was mild regurgitation. - Mitral valve: There was mild regurgitation directed centrally. - Left atrium: The atrium was moderately to severely dilated. - Tricuspid valve: There was moderate regurgitation. - Pulmonary arteries: Systolic pressure was mildly increased. PA   peak pressure: 41 mm Hg (S).  ASSESSMENT AND PLAN  1.  Midsternal chest pain/CAD:  No recurrent c/p.  Trop neg.   For MV today.  If nl, can likely be discharged and f/u with primary cardiologist @ [facility].  Cont asa, statin.  2.  Acute renal failure:  Improved/stable.  Can likely resume home dose of acei @ d/c.  3.  Essential HTN:  Stable.  4. Permanent Afib:  Rate controlled.  Not on anticoagulation.  CHA2DS2VASc = 7.  Defer to regular outpt cardiologist.  Apparently does not tolerate ASA.  5.  H/O CHF:  Nl EF by echo.  Euvolemic on exam.  HR/BP stable.  6.  DM II:  Per IM.  Signed, Murray Hodgkins NP

## 2014-09-24 NOTE — Progress Notes (Signed)
   Megan Walsh presented for a lexiscan cardiolite today.  No immediate complications.  Stress imaging pending.  Murray Hodgkins, NP 09/24/2014, 10:22 AM

## 2014-09-24 NOTE — Progress Notes (Signed)
[facility] PROGRESS NOTE  Megan Walsh DDU:[id number] DOB: March 26, 1933 DOA: 09/22/2014 PCP: No primary care provider on file.  Assessment/Plan: 81 y/o female with PMH of HTN, DM, CHF, PAF (not on anticoagulation due to hematuria), s/p PPM,  presented with chest pains   1. Chest pains, atypical. Trop: negative. ECG: paced. Pt denies acute cardiopulmonary symptoms -awaiting stress test. Cont ASA, statin. Not on BB. Per cardiology possible d/c today depending on stress test results  2. H/o PAF not on anticoagulation due to hematuria. Cardiology is following.  Defer to cardiology.  3. H/o CHF. Clinically euvolemic. Cont home regimen  4. DM. ha1c-6.8. Cont insulin regimen. Mild hypoglycemia due to NPO. Hypoglycemia-resolved   Possible d/c today. Pend stress test   Code Status: full Family Communication: d/w patient (indicate person spoken with, relationship, and if by phone, the number) Disposition Plan: home 24-48 hrs    Consultants:  Cardiology   Procedures:  Pend stress test   Antibiotics:  none (indicate start date, and stop date if known)  HPI/Subjective: alert  Objective: Filed Vitals:   09/24/14 1015  BP: 122/52  Pulse:   Temp:   Resp:     Intake/Output Summary (Last 24 hours) at 09/24/14 1211 Last data filed at 09/23/14 2046  Gross per 24 hour  Intake    540 ml  Output    450 ml  Net     90 ml   Filed Weights   09/23/14 0410 09/24/14 0430  Weight: 81.375 kg (179 lb 6.4 oz) 82.6 kg (182 lb 1.6 oz)    Exam:   General:  Alert, no distress   Cardiovascular: s1,s2 rrr  Respiratory: CTA BL  Abdomen: soft, nt nd   Musculoskeletal: mild chronic leg edema   Data Reviewed: Basic Metabolic Panel:  Recent Labs Lab 09/22/14 1340 09/22/14 1353 09/24/14 0528  NA 138 138 136  K 4.1 4.1 4.2  CL 100* 98* 104  CO2 28  --  25  GLUCOSE 158* 154* 109*  BUN 36* 44* 28*  CREATININE 1.30* 1.30* 1.01*  CALCIUM 9.6  --  8.5*   Liver Function  Tests:  Recent Labs Lab 09/22/14 1340  AST 37  ALT 20  ALKPHOS 56  BILITOT 1.1  PROT 7.4  ALBUMIN 4.2   No results for input(s): LIPASE, AMYLASE in the last 168 hours. No results for input(s): AMMONIA in the last 168 hours. CBC:  Recent Labs Lab 09/22/14 1340 09/22/14 1353 09/24/14 0528  WBC 3.8*  --  2.7*  HGB 12.8 14.6 11.1*  HCT 39.8 43.0 34.9*  MCV 86.5  --  87.0  PLT 183  --  162   Cardiac Enzymes:  Recent Labs Lab 09/22/14 2008 09/23/14 0223 09/23/14 0731  TROPONINI <0.03 <0.03 <0.03   BNP (last 3 results) No results for input(s): BNP in the last 8760 hours.  ProBNP (last 3 results) No results for input(s): PROBNP in the last 8760 hours.  CBG:  Recent Labs Lab 09/23/14 1545 09/23/14 1606 09/23/14 1659 09/23/14 2043 09/24/14 1126  GLUCAP 51* 63* 111* 114* 141*    No results found for this or any previous visit (from the past 240 hour(s)).   Studies: Dg Chest Portable 1 View  09/22/2014   CLINICAL DATA:  Short of breath today  EXAM: PORTABLE CHEST - 1 VIEW  COMPARISON:  09/16/2010  FINDINGS: The heart is moderately enlarged. Normal vascularity. Clear lungs. Dual lead left subclavian pacemaker and leads are stable and intact. No pneumothorax.  No pleural effusion.  IMPRESSION: No active disease.   Electronically Signed   By: Marybelle Killings M.D.   On: 09/22/2014 14:25    Scheduled Meds: . allopurinol  300 mg Oral Daily  . aspirin  324 mg Oral Once  . heparin  5,000 Units Subcutaneous 3 times per day  . insulin aspart  0-5 Units Subcutaneous QHS  . insulin aspart  0-9 Units Subcutaneous TID WC  . regadenoson      . simvastatin  40 mg Oral q1800  . traMADol  50 mg Oral TID   Continuous Infusions: . sodium chloride 20 mL/hr at 09/22/14 1358    Principal Problem:   Midsternal chest pain Active Problems:   CAD (coronary artery disease)   Atrial fibrillation with controlled ventricular response   HTN (hypertension)   Diabetes mellitus type 2,  insulin dependent   Pacemaker - MDT ADDR01 Implanted: 05/31/08 Serial# [id number]   History of CHF (congestive heart failure)   Acute renal failure   Gout   Abnormal EKG    Time spent: >35 minutes     Kinnie Feil  [facility] Pager [phone]. If 7PM-7AM, please contact night-coverage at [email or web address], password [facility] 09/24/2014, 12:11 PM  LOS: 2 days

## 2014-09-24 NOTE — Discharge Instructions (Signed)
Please follow up with cardiology in 1 week  °

## 2014-09-24 NOTE — Progress Notes (Signed)
Inpatient Diabetes Program Recommendations  AACE/ADA: New Consensus Statement on Inpatient Glycemic Control (2013)  Target Ranges:  Prepandial:   less than 140 mg/dL      Peak postprandial:   less than 180 mg/dL (1-2 hours)      Critically ill patients:  140 - 180 mg/dL    Results for KASHANA, BREACH (MRN [id number]) as of 09/24/2014 09:15  Ref. Range 09/23/2014 07:57 09/23/2014 11:30 09/23/2014 15:45 09/23/2014 16:06 09/23/2014 16:59 09/23/2014 20:43  Glucose-Capillary Latest Ref Range: 65-99 mg/dL 112 (H) 125 (H) 51 (L) 63 (L) 111 (H) 114 (H)   Reason for Visit: CP  Diabetes history: DM 2 Outpatient Diabetes medications: 70/30 14 units Daily Current orders for Inpatient glycemic control: 70/30 14 units Daily  Inpatient Diabetes Program Recommendations Insulin - Basal: Patient had hypoglycemia in the 50's with 70/30 14 units, she did not eat breakfast, so the short acting insulin in the 70/30 dropped her glucose levels.. Please reduce 70/30 to 10 units Daily. Since patient is NPO please hold and delay this mornings dose until this evening and place patient on Novolog sensitive correction Q4 hrs.  Thanks,  Tama Headings RN, MSN, [facility] Inpatient Diabetes Coordinator Team Pager [phone]

## 2014-10-10 ENCOUNTER — Encounter: Payer: Self-pay | Admitting: Cardiology

## 2014-10-10 ENCOUNTER — Ambulatory Visit (INDEPENDENT_AMBULATORY_CARE_PROVIDER_SITE_OTHER): Payer: Medicare Other | Admitting: Cardiology

## 2014-10-10 VITALS — BP 119/71 | HR 71 | Ht 60.0 in | Wt 182.0 lb

## 2014-10-10 DIAGNOSIS — I482 Chronic atrial fibrillation, unspecified: Secondary | ICD-10-CM

## 2014-10-10 DIAGNOSIS — R0789 Other chest pain: Secondary | ICD-10-CM

## 2014-10-10 DIAGNOSIS — I421 Obstructive hypertrophic cardiomyopathy: Secondary | ICD-10-CM | POA: Diagnosis not present

## 2014-10-10 DIAGNOSIS — Z950 Presence of cardiac pacemaker: Secondary | ICD-10-CM | POA: Diagnosis not present

## 2014-10-10 MED ORDER — FUROSEMIDE 20 MG PO TABS: ORAL_TABLET | ORAL | Status: DC

## 2014-10-10 MED ORDER — APIXABAN 5 MG PO TABS
5.0000 mg | ORAL_TABLET | Freq: Two times a day (BID) | ORAL | Status: DC
Start: 2014-10-10 — End: 2015-02-07

## 2014-10-10 NOTE — Progress Notes (Signed)
Clinical Summary Megan Walsh is a 81 y.o.female seen today as a new patient for the following medical problems.  1. Chest pain - admitted 09/22/14 with chest pain. Notes describe atypical pain - negative workup for ACS, stress MPI showed small apical infarct, no current ischemia. - 09/3014 echo LVEF 60-65%, no WMAs, mild AI, mild MR, severe asymmetric hypertrophy of the septum reported 2.1 cm, no dynamic gradient.  - denies any pain since discharge   2. Permanent pacemaker - previously followed at [facility], has not seen them in 2 years - denies any lightheadness, no dizziness  3. Afib - has not been on anticoag due to history of hematuria according to notes, however patient denies this - CHAD2Vasc score of 7, high risk.  - notes some occasional palpitations   4. HOCM - she is unsure of any episodes of syncope. No FH of SCD. Wall thickness <30mm. No history of NSVT  - denies any SOB/ no DOE.   Past Medical History  Diagnosis Date  . Hypertension   . Diabetes mellitus without complication   . CHF (congestive heart failure)   . Fatty tumor   . Myocardial infarction   . Coronary artery disease   . Dysrhythmia     a-fib  . Presence of permanent cardiac pacemaker     MDT ADDR01/Implanted: 05/31/08  . Sleep apnea   . Anxiety   . CHB (complete heart block)   . PAF (paroxysmal atrial fibrillation)      No Known Allergies   Current Outpatient Prescriptions  Medication Sig Dispense Refill  . allopurinol (ZYLOPRIM) 300 MG tablet Take 300 mg by mouth daily.    Marland Kitchen aspirin 81 MG tablet Take 1 tablet (81 mg total) by mouth daily. 30 tablet   . furosemide (LASIX) 20 MG tablet Take 10 mg by mouth.    . hydrochlorothiazide (HYDRODIURIL) 25 MG tablet Take 25 mg by mouth daily.    . insulin aspart protamine- aspart (NOVOLOG MIX 70/30) (70-30) 100 UNIT/ML injection Inject 14 Units into the skin daily as needed. Per sliding scale    . lisinopril (PRINIVIL,ZESTRIL) 20 MG tablet Take  20 mg by mouth daily.    . simvastatin (ZOCOR) 40 MG tablet Take 40 mg by mouth daily.    . traMADol (ULTRAM) 50 MG tablet Take 50 mg by mouth 3 (three) times daily.     No current facility-administered medications for this visit.     Past Surgical History  Procedure Laterality Date  . Pacemaker insertion  2010    at [facility]: MDT ADDR01/Implanted: 05/31/08/ # [id number]   . Abdominal hysterectomy    . Fatty tumor removal to left arm       No Known Allergies    No family history on file.   Social History Megan Walsh reports that she has never smoked. She does not have any smokeless tobacco history on file. Megan Walsh reports that she does not drink alcohol.   Review of Systems CONSTITUTIONAL: No weight loss, fever, chills, weakness or fatigue.  HEENT: Eyes: No visual loss, blurred vision, double vision or yellow sclerae.No hearing loss, sneezing, congestion, runny nose or sore throat.  SKIN: No rash or itching.  CARDIOVASCULAR: per HPI RESPIRATORY: No shortness of breath, cough or sputum.  GASTROINTESTINAL: No anorexia, nausea, vomiting or diarrhea. No abdominal pain or blood.  GENITOURINARY: No burning on urination, no polyuria NEUROLOGICAL: No headache, dizziness, syncope, paralysis, ataxia, numbness or tingling in the extremities. No change in  bowel or bladder control.  MUSCULOSKELETAL: No muscle, back pain, joint pain or stiffness.  LYMPHATICS: No enlarged nodes. No history of splenectomy.  PSYCHIATRIC: No history of depression or anxiety.  ENDOCRINOLOGIC: No reports of sweating, cold or heat intolerance. No polyuria or polydipsia.  Marland Kitchen   Physical Examination Filed Vitals:   10/10/14 1346  BP: 119/71  Pulse: 71   Filed Vitals:   10/10/14 1346  Height: 5' (1.524 m)  Weight: 182 lb (82.555 kg)    Gen: resting comfortably, no acute distress HEENT: no scleral icterus, pupils equal round and reactive, no palptable cervical adenopathy,  CV: irreg, no m/r/g, no  JVD Resp: Clear to auscultation bilaterally GI: abdomen is soft, non-tender, non-distended, normal bowel sounds, no hepatosplenomegaly MSK: extremities are warm, no edema.  Skin: warm, no rash Neuro:  no focal deficits Psych: appropriate affect     Assessment and Plan  1. Chest pain - negative workup during recent admission, including MPI with no ischemia - no recurrent symptoms, continue to follow clinically  2. Afib - no current symptoms, she currently is not on any rate controlling agents, notes mention history of complete heart block, she is not on Av nodal agent - high stoke risk CHADS2Vasc score of 7, will start eliquis 5mg  bid  3. Permanent pacemaker - normal device check during recent admission, will set her up with our device clinic  4. HOCM - no current symptoms, no outflow gradient on echo - no SCD risk factors - continue to follow clinically. Emphasized the importance of hydration. With no symptoms, no obstructive gradient, and complete heart blocker will not start beta blocker at this time.    F/u 3 months   [name], M.D.

## 2014-10-10 NOTE — Patient Instructions (Signed)
Your physician recommends that you schedule a follow-up appointment in: 3 MONTHS WITH DR. BRANCH  Your physician has recommended you make the following change in your medication:   STOP ASPIRIN   START ELIQUIS 5 MG TWICE DAILY  DECREASE LASIX 10 MG AS NEEDED FOR SWELLING  WE HAVE GIVEN YOU ELIQUIS SAMPLES AND SAVINGS CARD  You have been referred to CARDIAC ELECTROPHYSIOLOGY   YOU WILL FOLLOW UP WITH COUMADIN NURSE   Thank you for choosing [facility]!!

## 2014-10-18 DIAGNOSIS — E785 Hyperlipidemia, unspecified: Secondary | ICD-10-CM | POA: Diagnosis not present

## 2014-10-18 DIAGNOSIS — E162 Hypoglycemia, unspecified: Secondary | ICD-10-CM | POA: Diagnosis not present

## 2014-10-18 DIAGNOSIS — I10 Essential (primary) hypertension: Secondary | ICD-10-CM | POA: Diagnosis not present

## 2014-10-18 DIAGNOSIS — I519 Heart disease, unspecified: Secondary | ICD-10-CM | POA: Diagnosis not present

## 2014-10-18 DIAGNOSIS — E119 Type 2 diabetes mellitus without complications: Secondary | ICD-10-CM | POA: Diagnosis not present

## 2014-10-22 ENCOUNTER — Other Ambulatory Visit: Payer: Self-pay

## 2014-10-23 ENCOUNTER — Ambulatory Visit (INDEPENDENT_AMBULATORY_CARE_PROVIDER_SITE_OTHER): Payer: Medicare Other | Admitting: Internal Medicine

## 2014-10-23 ENCOUNTER — Encounter: Payer: Self-pay | Admitting: Internal Medicine

## 2014-10-23 VITALS — BP 108/56 | HR 84 | Ht 65.0 in | Wt 182.2 lb

## 2014-10-23 DIAGNOSIS — I495 Sick sinus syndrome: Secondary | ICD-10-CM | POA: Diagnosis not present

## 2014-10-23 DIAGNOSIS — Z950 Presence of cardiac pacemaker: Secondary | ICD-10-CM | POA: Diagnosis not present

## 2014-10-23 DIAGNOSIS — I482 Chronic atrial fibrillation, unspecified: Secondary | ICD-10-CM

## 2014-10-23 DIAGNOSIS — I4891 Unspecified atrial fibrillation: Secondary | ICD-10-CM | POA: Diagnosis not present

## 2014-10-23 DIAGNOSIS — I10 Essential (primary) hypertension: Secondary | ICD-10-CM

## 2014-10-23 LAB — CUP PACEART INCLINIC DEVICE CHECK
Battery Impedance: 717 Ohm
Brady Statistic AP VP Percent: 2.2 %
Brady Statistic AP VS Percent: 0.2 %
Brady Statistic AS VP Percent: 2.3 %
Brady Statistic AS VS Percent: 95.4 %
Date Time Interrogation Session: 20160615173200
Lead Channel Impedance Value: 1488 Ohm
Lead Channel Impedance Value: 878 Ohm
Lead Channel Sensing Intrinsic Amplitude: 0.7 mV
Lead Channel Sensing Intrinsic Amplitude: 15.67 mV
Lead Channel Setting Pacing Amplitude: 2.5 V
Lead Channel Setting Pacing Pulse Width: 0.4 ms
MDC IDC MSMT BATTERY REMAINING LONGEVITY: 70 mo
MDC IDC MSMT BATTERY VOLTAGE: 2.79 V
MDC IDC MSMT LEADCHNL RV PACING THRESHOLD AMPLITUDE: 0.75 V
MDC IDC MSMT LEADCHNL RV PACING THRESHOLD PULSEWIDTH: 0.4 ms
MDC IDC SET LEADCHNL RV SENSING SENSITIVITY: 5.6 mV

## 2014-10-23 NOTE — Progress Notes (Signed)
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 female with a h/o permanent atrial fibrillation and symptomatic bradycardia sp PPM (MDT) who presents today to establish care in the Electrophysiology device clinic.  Her initial pacemaker was implanted in the 1990s for syncope and bradycardia. She underwent gen change by Dr Reinaldo Berber 05/31/2008.  The patient reports doing very well since having a pacemaker implanted and remains very active despite her age.  She has stable SOB.  She has been started on eliquis recently and feels that she is having more postural dizziness with this medicine.  She has rare palpitations.   Today, she  denies symptoms of chest pain, shortness of breath, orthopnea, PND, lower extremity edema,  presyncope, syncope, or neurologic sequela.  The patientis tolerating medications without difficulties and is otherwise without complaint today.   Past Medical History  Diagnosis Date  . Hypertension   . Diabetes mellitus without complication   . CHF (congestive heart failure)   . Fatty tumor   . Myocardial infarction   . Coronary artery disease   . Dysrhythmia     a-fib  . Presence of permanent cardiac pacemaker     MDT ADDR01/Implanted: 05/31/08  . Sleep apnea   . Anxiety   . Permanent atrial fibrillation   . Symptomatic bradycardia    Past Surgical History  Procedure Laterality Date  . Pacemaker generator change  05/31/08    at [facility]: MDT ADDR01/Implanted: 05/31/08 as gen change by Dr Reinaldo Berber  . Abdominal hysterectomy    . Fatty tumor removal to left arm    . Pacemaker insertion  1992    History   Social History  . Marital Status: Single    Spouse Name: N/A  . Number of Children: N/A  . Years of Education: N/A   Occupational History  . Not on file.   Social History Main Topics  . Smoking status: Former Smoker    Quit date: 05/10/1974  . Smokeless tobacco: Not on file  . Alcohol Use: No  . Drug Use: No  . Sexual  Activity: No   Other Topics Concern  . Not on file   Social History Narrative   Lives in [place]    Family History  Problem Relation Age of Onset  . Heart failure Mother     Allergies  Allergen Reactions  . Atorvastatin Swelling    Coughing    Current Outpatient Prescriptions  Medication Sig Dispense Refill  . allopurinol (ZYLOPRIM) 300 MG tablet Take 300 mg by mouth daily.    Marland Kitchen apixaban (ELIQUIS) 5 MG TABS tablet Take 1 tablet (5 mg total) by mouth 2 (two) times daily. 60 tablet 3  . furosemide (LASIX) 20 MG tablet 0.5 TAB (10 MG) AS NEEDED FOR SWELLING 90 tablet 3  . hydrochlorothiazide (HYDRODIURIL) 25 MG tablet Take 25 mg by mouth daily.    . insulin aspart protamine- aspart (NOVOLOG MIX 70/30) (70-30) 100 UNIT/ML injection Inject 14 Units into the skin daily as needed. Per sliding scale    . lisinopril (PRINIVIL,ZESTRIL) 20 MG tablet Take 20 mg by mouth daily.    . simvastatin (ZOCOR) 40 MG tablet Take 40 mg by mouth daily.    . traMADol (ULTRAM) 50 MG tablet Take 50 mg by mouth 3 (three) times daily.     No current facility-administered medications for this visit.    ROS- all systems are reviewed and negative except as per HPI  Physical  Exam: Filed Vitals:   10/23/14 1627  BP: 108/56  Pulse: 84  Height: 5\' 5"  (1.651 m)  Weight: 82.645 kg (182 lb 3.2 oz)  SpO2: 99%    GEN- The patient is elderly appearing, alert and oriented x 3 today.   Head- normocephalic, atraumatic Eyes-  Sclera clear, conjunctiva pink Ears- hearing intact Oropharynx- clear Neck- supple,  Lungs- Clear to ausculation bilaterally, normal work of breathing Chest- pacemaker pocket is well healed Heart- irregular rate and rhythm, no murmurs, rubs or gallops, PMI not laterally displaced GI- soft, NT, ND, + BS Extremities- no clubbing, cyanosis, or edema MS- no significant deformity or atrophy Skin- no rash or lesion Psych- euthymic mood, full affect Neuro- strength and sensation are  intact  Pacemaker interrogation- reviewed in detail today,  See PACEART report  Assessment and Plan:  1. Tachy/brady syndrome Normal pacemaker function See Claudia Desanctis Art report Will enroll in carelink and follow remotely Could consider adding diltiazem as she does have some elevated V rates I will defer to Dr Harl Bowie at this time  2. Permanent afib Pacemaker changed from DDIR to VVIR today Could consider adding diltiazem as above as V rates are occasionally elevated Continue eliquis Check bmet/ cbc today given dizziness Follow-up with Dr Harl Bowie as scheduled  3. HTN Stable No change required today  4. Chronic diastolic dysfunction Stable No change required today  Return to see me in the [facility] device clinic in 1 year

## 2014-10-23 NOTE — Patient Instructions (Signed)
Medication Instructions:  Your physician recommends that you continue on your current medications as directed. Please refer to the Current Medication list given to you today.   Labwork: Your physician recommends that you return for lab work today: CBC/BMP   Testing/Procedures: None ordered  Follow-Up: Your physician wants you to follow-up in: 12 months with Dr Rayann Heman in [place] will receive a reminder letter in the mail two months in advance. If you don't receive a letter, please call our office to schedule the follow-up appointment.  Remote monitoring is used to monitor your Pacemaker or ICD from home. This monitoring reduces the number of office visits required to check your device to one time per year. It allows [place] to keep an eye on the functioning of your device to ensure it is working properly. You are scheduled for a device check from home on 01/22/15. You may send your transmission at any time that day. If you have a wireless device, the transmission will be sent automatically. After your physician reviews your transmission, you will receive a postcard with your next transmission date.     Any Other Special Instructions Will Be Listed Below (If Applicable).

## 2014-10-24 LAB — CBC WITH DIFFERENTIAL/PLATELET
BASOS ABS: 0 10*3/uL (ref 0.0–0.1)
Basophils Relative: 0.5 % (ref 0.0–3.0)
EOS ABS: 0.1 10*3/uL (ref 0.0–0.7)
Eosinophils Relative: 3.3 % (ref 0.0–5.0)
HEMATOCRIT: 38.5 % (ref 36.0–46.0)
HEMOGLOBIN: 12.8 g/dL (ref 12.0–15.0)
LYMPHS ABS: 1.4 10*3/uL (ref 0.7–4.0)
Lymphocytes Relative: 35.5 % (ref 12.0–46.0)
MCHC: 33.2 g/dL (ref 30.0–36.0)
MCV: 85.3 fl (ref 78.0–100.0)
MONO ABS: 0.6 10*3/uL (ref 0.1–1.0)
MONOS PCT: 16.6 % — AB (ref 3.0–12.0)
Neutro Abs: 1.7 10*3/uL (ref 1.4–7.7)
Neutrophils Relative %: 44.1 % (ref 43.0–77.0)
Platelets: 184 10*3/uL (ref 150.0–400.0)
RBC: 4.51 Mil/uL (ref 3.87–5.11)
RDW: 14.1 % (ref 11.5–15.5)
WBC: 3.9 10*3/uL — ABNORMAL LOW (ref 4.0–10.5)

## 2014-10-24 LAB — BASIC METABOLIC PANEL
BUN: 27 mg/dL — ABNORMAL HIGH (ref 6–23)
CHLORIDE: 103 meq/L (ref 96–112)
CO2: 28 meq/L (ref 19–32)
CREATININE: 1.2 mg/dL (ref 0.40–1.20)
Calcium: 9.7 mg/dL (ref 8.4–10.5)
GFR: 55.57 mL/min — ABNORMAL LOW (ref >60.00)
Glucose, Bld: 132 mg/dL — ABNORMAL HIGH (ref 70–99)
Potassium: 4 mEq/L (ref 3.5–5.1)
SODIUM: 137 meq/L (ref 135–145)

## 2014-11-08 DIAGNOSIS — I480 Paroxysmal atrial fibrillation: Secondary | ICD-10-CM | POA: Diagnosis not present

## 2014-11-08 DIAGNOSIS — E119 Type 2 diabetes mellitus without complications: Secondary | ICD-10-CM | POA: Diagnosis not present

## 2014-11-08 DIAGNOSIS — M14862 Arthropathies in other specified diseases classified elsewhere, left knee: Secondary | ICD-10-CM | POA: Diagnosis not present

## 2014-11-13 DIAGNOSIS — M25562 Pain in left knee: Secondary | ICD-10-CM | POA: Diagnosis not present

## 2014-11-13 DIAGNOSIS — M1712 Unilateral primary osteoarthritis, left knee: Secondary | ICD-10-CM | POA: Diagnosis not present

## 2014-11-13 DIAGNOSIS — M1612 Unilateral primary osteoarthritis, left hip: Secondary | ICD-10-CM | POA: Diagnosis not present

## 2014-11-13 DIAGNOSIS — M2392 Unspecified internal derangement of left knee: Secondary | ICD-10-CM | POA: Diagnosis not present

## 2014-11-13 DIAGNOSIS — M25552 Pain in left hip: Secondary | ICD-10-CM | POA: Diagnosis not present

## 2014-11-13 DIAGNOSIS — M249 Joint derangement, unspecified: Secondary | ICD-10-CM | POA: Diagnosis not present

## 2014-11-14 ENCOUNTER — Ambulatory Visit (INDEPENDENT_AMBULATORY_CARE_PROVIDER_SITE_OTHER): Payer: Medicare Other | Admitting: *Deleted

## 2014-11-14 DIAGNOSIS — I480 Paroxysmal atrial fibrillation: Secondary | ICD-10-CM

## 2014-11-14 NOTE — Progress Notes (Signed)
Pt was started on Eliquis 5mg  bid for atrial fibrillation on 10/10/14 by Dr Harl Bowie.    Pre labs on 09/24/14:  SrCr 1.01  Hgb 11.1  Hct 34.9  Reviewed patients medication list.  Pt is not currently on any combined P-gp and strong CYP3A4 inhibitors/inducers (ketoconazole, traconazole, ritonavir, carbamazepine, phenytoin, rifampin, St. John's wort).  Reviewed labs from 12/04/14.  SCr 1.35, Weight 184, CrCl 43.79.  Dose Dose is appropriate based on 2 out of 3 criteria (age,wt, SrCr).   Hgb and HCT: 12.8/38.5  A full discussion of the nature of anticoagulants has been carried out.  A benefit/risk analysis has been presented to the patient, so that they understand the justification for choosing anticoagulation with Eliquis at this time.  The need for compliance is stressed.  Pt is aware to take the medication twice daily.  Side effects of potential bleeding are discussed, including unusual colored urine or stools, coughing up blood or coffee ground emesis, nose bleeds or serious fall or head trauma.  Discussed signs and symptoms of stroke. The patient should avoid any OTC items containing aspirin or ibuprofen.  Avoid alcohol consumption.   Call if any signs of abnormal bleeding.  Discussed financial obligations and resolved any difficulty in obtaining medication.  Next lab test test in 6 months.   Will recheck labs on 12/04/14 @ [place] since pt had only be on Eliquis 2 wks when last labs were drawn.

## 2014-12-04 DIAGNOSIS — I480 Paroxysmal atrial fibrillation: Secondary | ICD-10-CM | POA: Diagnosis not present

## 2014-12-17 DIAGNOSIS — I519 Heart disease, unspecified: Secondary | ICD-10-CM | POA: Diagnosis not present

## 2014-12-17 DIAGNOSIS — E119 Type 2 diabetes mellitus without complications: Secondary | ICD-10-CM | POA: Diagnosis not present

## 2014-12-17 DIAGNOSIS — I10 Essential (primary) hypertension: Secondary | ICD-10-CM | POA: Diagnosis not present

## 2014-12-17 DIAGNOSIS — E785 Hyperlipidemia, unspecified: Secondary | ICD-10-CM | POA: Diagnosis not present

## 2014-12-17 DIAGNOSIS — E162 Hypoglycemia, unspecified: Secondary | ICD-10-CM | POA: Diagnosis not present

## 2015-01-14 ENCOUNTER — Ambulatory Visit: Payer: PRIVATE HEALTH INSURANCE | Admitting: Cardiology

## 2015-01-14 DIAGNOSIS — H2513 Age-related nuclear cataract, bilateral: Secondary | ICD-10-CM | POA: Diagnosis not present

## 2015-01-16 ENCOUNTER — Encounter: Payer: Self-pay | Admitting: Cardiology

## 2015-01-16 ENCOUNTER — Ambulatory Visit (INDEPENDENT_AMBULATORY_CARE_PROVIDER_SITE_OTHER): Payer: Medicare Other | Admitting: Cardiology

## 2015-01-16 VITALS — BP 108/67 | HR 70 | Ht 60.0 in | Wt 180.8 lb

## 2015-01-16 DIAGNOSIS — I480 Paroxysmal atrial fibrillation: Secondary | ICD-10-CM

## 2015-01-16 DIAGNOSIS — R0789 Other chest pain: Secondary | ICD-10-CM

## 2015-01-16 DIAGNOSIS — I10 Essential (primary) hypertension: Secondary | ICD-10-CM | POA: Diagnosis not present

## 2015-01-16 DIAGNOSIS — I421 Obstructive hypertrophic cardiomyopathy: Secondary | ICD-10-CM

## 2015-01-16 DIAGNOSIS — Z950 Presence of cardiac pacemaker: Secondary | ICD-10-CM

## 2015-01-16 MED ORDER — LISINOPRIL 10 MG PO TABS: 10.0000 mg | ORAL_TABLET | Freq: Every day | ORAL | Status: DC

## 2015-01-16 NOTE — Progress Notes (Signed)
Patient ID: Megan Walsh, female   DOB: 04-07-1933, 81 y.o.   MRN: [id number]     Clinical Summary Ms. Aquilar is a 81 y.o.female seen today for follow up of the following medical problems.   1. Chest pain - admitted 09/22/14 with chest pain. Notes describe atypical pain - negative workup for ACS, stress MPI showed small apical infarct, no current ischemia. - 09/3014 echo LVEF 60-65%, no WMAs, mild AI, mild MR, severe asymmetric hypertrophy of the septum reported 2.1 cm, no dynamic gradient.   - denies any recent chest pain.    2. Permanent pacemaker - followed by Dr Rayann Heman, normal function on last check.   3. Afib - CHAD2Vasc score of 7, high risk. She is on eliquis - denies bleeding on anticoag. Denies any palpitations.   4. HOCM - she is unsure of any episodes of syncope. No FH of SCD. Wall thickness <47mm. No history of NSVT  - denies any SOB/ no DOE.   5. HTN - notes some low bps at times.   6. Hyperlipidemia - 09/2014 TC 124 TG 69 HDL 62 LDL 48 - compliant with statin Past Medical History  Diagnosis Date  . Hypertension   . Diabetes mellitus without complication   . CHF (congestive heart failure)   . Fatty tumor   . Myocardial infarction   . Coronary artery disease   . Dysrhythmia     a-fib  . Presence of permanent cardiac pacemaker     MDT ADDR01/Implanted: 05/31/08  . Sleep apnea   . Anxiety   . Permanent atrial fibrillation   . Symptomatic bradycardia      Allergies  Allergen Reactions  . Atorvastatin Swelling    Coughing     Current Outpatient Prescriptions  Medication Sig Dispense Refill  . allopurinol (ZYLOPRIM) 300 MG tablet Take 300 mg by mouth daily.    Marland Kitchen apixaban (ELIQUIS) 5 MG TABS tablet Take 1 tablet (5 mg total) by mouth 2 (two) times daily. 60 tablet 3  . furosemide (LASIX) 20 MG tablet 0.5 TAB (10 MG) AS NEEDED FOR SWELLING 90 tablet 3  . hydrochlorothiazide (HYDRODIURIL) 25 MG tablet Take 25 mg by mouth daily.    . insulin aspart  protamine- aspart (NOVOLOG MIX 70/30) (70-30) 100 UNIT/ML injection Inject 14 Units into the skin daily as needed. Per sliding scale    . lisinopril (PRINIVIL,ZESTRIL) 20 MG tablet Take 20 mg by mouth daily.    . simvastatin (ZOCOR) 40 MG tablet Take 40 mg by mouth daily.    . traMADol (ULTRAM) 50 MG tablet Take 50 mg by mouth 3 (three) times daily.     No current facility-administered medications for this visit.     Past Surgical History  Procedure Laterality Date  . Pacemaker generator change  05/31/08    at [facility]: MDT ADDR01/Implanted: 05/31/08 as gen change by Dr Reinaldo Berber  . Abdominal hysterectomy    . Fatty tumor removal to left arm    . Pacemaker insertion  1992     Allergies  Allergen Reactions  . Atorvastatin Swelling    Coughing      Family History  Problem Relation Age of Onset  . Heart failure Mother      Social History Ms. Ayyad reports that she quit smoking about 40 years ago. She does not have any smokeless tobacco history on file. Ms. Hellberg reports that she does not drink alcohol.   Review of Systems CONSTITUTIONAL: No weight loss, fever, chills, weakness  or fatigue.  HEENT: Eyes: No visual loss, blurred vision, double vision or yellow sclerae.No hearing loss, sneezing, congestion, runny nose or sore throat.  SKIN: No rash or itching.  CARDIOVASCULAR: per HPI RESPIRATORY: No shortness of breath, cough or sputum.  GASTROINTESTINAL: No anorexia, nausea, vomiting or diarrhea. No abdominal pain or blood.  GENITOURINARY: No burning on urination, no polyuria NEUROLOGICAL: No headache, dizziness, syncope, paralysis, ataxia, numbness or tingling in the extremities. No change in bowel or bladder control.  MUSCULOSKELETAL: No muscle, back pain, joint pain or stiffness.  LYMPHATICS: No enlarged nodes. No history of splenectomy.  PSYCHIATRIC: No history of depression or anxiety.  ENDOCRINOLOGIC: No reports of sweating, cold or heat intolerance. No polyuria or  polydipsia.  Marland Kitchen   Physical Examination Filed Vitals:   01/16/15 1334  BP: 108/67  Pulse: 70   Filed Vitals:   01/16/15 1334  Height: 5' (1.524 m)  Weight: 180 lb 12.8 oz (82.01 kg)    Gen: resting comfortably, no acute distress HEENT: no scleral icterus, pupils equal round and reactive, no palptable cervical adenopathy,  CV: RRR, no mr/g, no JVD Resp: Clear to auscultation bilaterally GI: abdomen is soft, non-tender, non-distended, normal bowel sounds, no hepatosplenomegaly MSK: extremities are warm, no edema.  Skin: warm, no rash Neuro:  no focal deficits Psych: appropriate affect   Diagnostic Studies 09/2014 echo Study Conclusions  - Left ventricle: The cavity size was normal. There was severe asymmetric hypertrophy of the septum. Systolic function was normal. The estimated ejection fraction was in the range of 60% to 65%. Wall motion was normal; there were no regional wall motion abnormalities. - Aortic valve: There was mild regurgitation. - Mitral valve: There was mild regurgitation directed centrally. - Left atrium: The atrium was moderately to severely dilated. - Tricuspid valve: There was moderate regurgitation. - Pulmonary arteries: Systolic pressure was mildly increased. PA peak pressure: 41 mm Hg (S).  Impressions:  - Appearance is consistent with hypertrophic cardiomyopathy with no outflow tract obstruction (at rest).    Assessment and Plan  1. Chest pain - negative workup during recent admission, including MPI with no ischemia - no recurrent symptoms, continue to follow clinically  2. Afib - no current symptoms, continue anticoagulation. Denies any palpitations, occasional high rates on device check. Will continue to follow, consider rate controlling agent if develops symptoms or rates increase.   3. Permanent pacemaker - continue to follow with EP  4. HOCM - no current symptoms, no outflow gradient on echo - no SCD risk factors -  continue to follow clinically. Soft bp's and lack of symptoms, no gradient on last echo, will not start beta blocker at this time.   5. HTN - orthostatic symptoms, will decrease lisionpril to 10mg  daily. She reports LE edema and states she needs her current diuretic dosing.   F/u 6 months  [name], M.D.

## 2015-01-16 NOTE — Patient Instructions (Signed)
Your physician wants you to follow-up in: [place] DR. BRANCH You will receive a reminder letter in the mail two months in advance. If you don't receive a letter, please call our office to schedule the follow-up appointment.  Your physician has recommended you make the following change in your medication:   DECREASE LISINOPRIL 10 MG DAILY  Thank you for choosing [facility]!!

## 2015-01-22 ENCOUNTER — Telehealth: Payer: Self-pay | Admitting: Cardiology

## 2015-01-22 ENCOUNTER — Encounter: Payer: Medicare Other | Admitting: *Deleted

## 2015-01-22 NOTE — Telephone Encounter (Signed)
Attempted to confirm remote transmission with pt. No answer and was unable to leave a message.   

## 2015-01-23 ENCOUNTER — Encounter: Payer: Self-pay | Admitting: Cardiology

## 2015-02-07 ENCOUNTER — Other Ambulatory Visit: Payer: Self-pay | Admitting: Cardiology

## 2015-02-14 DIAGNOSIS — Z23 Encounter for immunization: Secondary | ICD-10-CM | POA: Diagnosis not present

## 2015-02-15 DIAGNOSIS — E784 Other hyperlipidemia: Secondary | ICD-10-CM | POA: Diagnosis not present

## 2015-02-15 DIAGNOSIS — I5189 Other ill-defined heart diseases: Secondary | ICD-10-CM | POA: Diagnosis not present

## 2015-02-15 DIAGNOSIS — E119 Type 2 diabetes mellitus without complications: Secondary | ICD-10-CM | POA: Diagnosis not present

## 2015-02-15 DIAGNOSIS — E161 Other hypoglycemia: Secondary | ICD-10-CM | POA: Diagnosis not present

## 2015-02-15 DIAGNOSIS — I10 Essential (primary) hypertension: Secondary | ICD-10-CM | POA: Diagnosis not present

## 2015-02-24 DIAGNOSIS — I6523 Occlusion and stenosis of bilateral carotid arteries: Secondary | ICD-10-CM | POA: Diagnosis not present

## 2015-02-24 DIAGNOSIS — I6529 Occlusion and stenosis of unspecified carotid artery: Secondary | ICD-10-CM | POA: Diagnosis not present

## 2015-02-24 DIAGNOSIS — I10 Essential (primary) hypertension: Secondary | ICD-10-CM | POA: Diagnosis not present

## 2015-02-24 DIAGNOSIS — E1121 Type 2 diabetes mellitus with diabetic nephropathy: Secondary | ICD-10-CM | POA: Diagnosis not present

## 2015-02-24 DIAGNOSIS — I208 Other forms of angina pectoris: Secondary | ICD-10-CM | POA: Diagnosis not present

## 2015-02-24 DIAGNOSIS — E782 Mixed hyperlipidemia: Secondary | ICD-10-CM | POA: Diagnosis not present

## 2015-02-24 DIAGNOSIS — I480 Paroxysmal atrial fibrillation: Secondary | ICD-10-CM | POA: Diagnosis not present

## 2015-04-09 DIAGNOSIS — E119 Type 2 diabetes mellitus without complications: Secondary | ICD-10-CM | POA: Diagnosis not present

## 2015-04-16 DIAGNOSIS — H02834 Dermatochalasis of left upper eyelid: Secondary | ICD-10-CM | POA: Diagnosis not present

## 2015-04-16 DIAGNOSIS — E161 Other hypoglycemia: Secondary | ICD-10-CM | POA: Diagnosis not present

## 2015-04-16 DIAGNOSIS — E119 Type 2 diabetes mellitus without complications: Secondary | ICD-10-CM | POA: Diagnosis not present

## 2015-04-16 DIAGNOSIS — E784 Other hyperlipidemia: Secondary | ICD-10-CM | POA: Diagnosis not present

## 2015-04-16 DIAGNOSIS — I10 Essential (primary) hypertension: Secondary | ICD-10-CM | POA: Diagnosis not present

## 2015-04-16 DIAGNOSIS — I5189 Other ill-defined heart diseases: Secondary | ICD-10-CM | POA: Diagnosis not present

## 2015-04-25 ENCOUNTER — Telehealth: Payer: Self-pay | Admitting: Cardiology

## 2015-04-25 NOTE — Telephone Encounter (Signed)
Patient c/o small amount of blood the size of a nickel that was brown in color times one on her pad today. No chest pain, dizziness or sob. Please advise.

## 2015-04-25 NOTE — Telephone Encounter (Signed)
Was told to contact office if she had any bleeding.  Stated that when she woke this morning she notice vaginal bleeding  [phone]

## 2015-04-28 NOTE — Telephone Encounter (Signed)
If only small amount I would continue her blood thinner. She should make her pcp aware.   Zandra Abts MD

## 2015-04-30 NOTE — Telephone Encounter (Signed)
Spoke with son and asked him to have patient contact our office.

## 2015-05-21 ENCOUNTER — Encounter: Payer: Self-pay | Admitting: *Deleted

## 2015-05-21 NOTE — Telephone Encounter (Signed)
Letter mailed to patient with MD response.

## 2015-05-27 DIAGNOSIS — I480 Paroxysmal atrial fibrillation: Secondary | ICD-10-CM | POA: Diagnosis not present

## 2015-05-27 DIAGNOSIS — E1121 Type 2 diabetes mellitus with diabetic nephropathy: Secondary | ICD-10-CM | POA: Diagnosis not present

## 2015-05-27 DIAGNOSIS — I482 Chronic atrial fibrillation: Secondary | ICD-10-CM | POA: Diagnosis not present

## 2015-06-10 ENCOUNTER — Other Ambulatory Visit: Payer: Self-pay | Admitting: Cardiology

## 2015-06-15 DIAGNOSIS — E161 Other hypoglycemia: Secondary | ICD-10-CM | POA: Diagnosis not present

## 2015-06-15 DIAGNOSIS — I10 Essential (primary) hypertension: Secondary | ICD-10-CM | POA: Diagnosis not present

## 2015-06-15 DIAGNOSIS — E119 Type 2 diabetes mellitus without complications: Secondary | ICD-10-CM | POA: Diagnosis not present

## 2015-06-15 DIAGNOSIS — E784 Other hyperlipidemia: Secondary | ICD-10-CM | POA: Diagnosis not present

## 2015-06-15 DIAGNOSIS — I5189 Other ill-defined heart diseases: Secondary | ICD-10-CM | POA: Diagnosis not present

## 2015-07-24 ENCOUNTER — Encounter: Payer: Self-pay | Admitting: Cardiology

## 2015-07-24 ENCOUNTER — Ambulatory Visit (INDEPENDENT_AMBULATORY_CARE_PROVIDER_SITE_OTHER): Payer: Medicare Other | Admitting: Cardiology

## 2015-07-24 VITALS — BP 116/70 | HR 71 | Ht 60.0 in | Wt 181.0 lb

## 2015-07-24 DIAGNOSIS — Z950 Presence of cardiac pacemaker: Secondary | ICD-10-CM | POA: Diagnosis not present

## 2015-07-24 DIAGNOSIS — I10 Essential (primary) hypertension: Secondary | ICD-10-CM

## 2015-07-24 DIAGNOSIS — I421 Obstructive hypertrophic cardiomyopathy: Secondary | ICD-10-CM | POA: Diagnosis not present

## 2015-07-24 DIAGNOSIS — I4891 Unspecified atrial fibrillation: Secondary | ICD-10-CM | POA: Diagnosis not present

## 2015-07-24 DIAGNOSIS — R0789 Other chest pain: Secondary | ICD-10-CM | POA: Diagnosis not present

## 2015-07-24 NOTE — Patient Instructions (Signed)
Your physician wants you to follow-up in: 6 months with Dr. Bryna Colander will receive a reminder letter in the mail two months in advance. If you don't receive a letter, please call our office to schedule the follow-up appointment  Your physician recommends that you continue on your current medications as directed. Please refer to the Current Medication list given to you today.  LISINOPRIL 10 MG IS THE DOSE YOU SHOULD BE TAKING   WE HAVE GIVEN YOU A LIST OF PRIMARY DOCTORS  WE WILL SCHEDULE YOU A FOLLOW UP VISIT WITH THE DEVICE CLINIC  Thank you for choosing [facility]!!

## 2015-07-24 NOTE — Progress Notes (Signed)
Patient ID: Megan Walsh, female   DOB: 1932/05/24, 82 y.o.   MRN: [id number]     Clinical Summary Megan Walsh is a 82 y.o.female seen today for follow up of the following medical problems.    1. Chest pain - admitted 09/22/14 with chest pain. Notes describe atypical pain - negative workup for ACS, stress MPI showed small apical infarct, no current ischemia. - 09/3014 echo LVEF 60-65%, no WMAs, mild AI, mild MR, severe asymmetric hypertrophy of the septum reported 2.1 cm, no dynamic gradient.   - denies any recent chest pain since last visit   2. Permanent pacemaker - followed by Dr Rayann Heman, normal function on last check. - no recent symptoms.   3. Afib - CHAD2Vasc score of 7, high risk. She is on eliquis - denies any palpitations. Tolerating anticoag without troubles.    4. HOCM - she is unsure of any episodes of syncope. No FH of SCD. Wall thickness <70mm. No history of NSVT  - no recent chest pain, SOB, or syncope.   5. HTN - notes some low bps at times.  - last visit we decreased lisionpril to 10mg  daily, symptoms have improved.   6. Hyperlipidemia - 09/2014 TC 124 TG 69 HDL 62 LDL 48 - has labs coming up with pcp - compliant with statin  Past Medical History  Diagnosis Date  . Hypertension   . Diabetes mellitus without complication   . CHF (congestive heart failure)   . Fatty tumor   . Myocardial infarction ([place])   . Coronary artery disease   . Dysrhythmia     a-fib  . Presence of permanent cardiac pacemaker     MDT ADDR01/Implanted: 05/31/08  . Sleep apnea   . Anxiety   . Permanent atrial fibrillation   . Symptomatic bradycardia      Allergies  Allergen Reactions  . Atorvastatin Swelling    Coughing     Current Outpatient Prescriptions  Medication Sig Dispense Refill  . allopurinol (ZYLOPRIM) 300 MG tablet Take 300 mg by mouth daily.    Marland Kitchen ELIQUIS 5 MG TABS tablet TAKE 1 TABLET BY MOUTH TWICE DAILY 60 tablet 3  . furosemide (LASIX) 20 MG tablet  0.5 TAB (10 MG) AS NEEDED FOR SWELLING 90 tablet 3  . hydrochlorothiazide (HYDRODIURIL) 25 MG tablet Take 25 mg by mouth daily.    . insulin aspart protamine- aspart (NOVOLOG MIX 70/30) (70-30) 100 UNIT/ML injection Inject 14 Units into the skin daily as needed. Per sliding scale    . lisinopril (PRINIVIL,ZESTRIL) 10 MG tablet Take 1 tablet (10 mg total) by mouth daily. 90 tablet 3  . simvastatin (ZOCOR) 40 MG tablet Take 40 mg by mouth daily.    . traMADol (ULTRAM) 50 MG tablet Take 50 mg by mouth daily.      No current facility-administered medications for this visit.     Past Surgical History  Procedure Laterality Date  . Pacemaker generator change  05/31/08    at [facility]: MDT ADDR01/Implanted: 05/31/08 as gen change by Dr Reinaldo Berber  . Abdominal hysterectomy    . Fatty tumor removal to left arm    . Pacemaker insertion  1992     Allergies  Allergen Reactions  . Atorvastatin Swelling    Coughing      Family History  Problem Relation Age of Onset  . Heart failure Mother      Social History Megan Walsh reports that she quit smoking about 41 years ago. She does not  have any smokeless tobacco history on file. Megan Walsh reports that she does not drink alcohol.   Review of Systems CONSTITUTIONAL: No weight loss, fever, chills, weakness or fatigue.  HEENT: Eyes: No visual loss, blurred vision, double vision or yellow sclerae.No hearing loss, sneezing, congestion, runny nose or sore throat.  SKIN: No rash or itching.  CARDIOVASCULAR: per HPI RESPIRATORY: No shortness of breath, cough or sputum.  GASTROINTESTINAL: No anorexia, nausea, vomiting or diarrhea. No abdominal pain or blood.  GENITOURINARY: No burning on urination, no polyuria NEUROLOGICAL: No headache, dizziness, syncope, paralysis, ataxia, numbness or tingling in the extremities. No change in bowel or bladder control.  MUSCULOSKELETAL: No muscle, back pain, joint pain or stiffness.  LYMPHATICS: No enlarged nodes.  No history of splenectomy.  PSYCHIATRIC: No history of depression or anxiety.  ENDOCRINOLOGIC: No reports of sweating, cold or heat intolerance. No polyuria or polydipsia.  Marland Kitchen   Physical Examination Filed Vitals:   07/24/15 0859  BP: 116/70  Pulse: 71   Filed Vitals:   07/24/15 0859  Height: 5' (1.524 m)  Weight: 181 lb (82.101 kg)    Gen: resting comfortably, no acute distress HEENT: no scleral icterus, pupils equal round and reactive, no palptable cervical adenopathy,  CV: RRR, no m/r/g, no jvd Resp: Clear to auscultation bilaterally GI: abdomen is soft, non-tender, non-distended, normal bowel sounds, no hepatosplenomegaly MSK: extremities are warm, no edema.  Skin: warm, no rash Neuro:  no focal deficits Psych: appropriate affect   Diagnostic Studies 09/2014 echo Study Conclusions  - Left ventricle: The cavity size was normal. There was severe asymmetric hypertrophy of the septum. Systolic function was normal. The estimated ejection fraction was in the range of 60% to 65%. Wall motion was normal; there were no regional wall motion abnormalities. - Aortic valve: There was mild regurgitation. - Mitral valve: There was mild regurgitation directed centrally. - Left atrium: The atrium was moderately to severely dilated. - Tricuspid valve: There was moderate regurgitation. - Pulmonary arteries: Systolic pressure was mildly increased. PA peak pressure: 41 mm Hg (S).  Impressions:  - Appearance is consistent with hypertrophic cardiomyopathy with no outflow tract obstruction (at rest).     Assessment and Plan  1. Chest pain - negative workup during recent admission, including MPI with no ischemia - no recurrent symptoms - continue to follow clinically  2. Afib - no current symptoms, continue anticoagulation.  3. Permanent pacemaker - continue to follow with EP  4. HOCM - no current symptoms, no outflow gradient on echo - no SCD risk factors -  continue to follow clinically. Soft bp's and lack of symptoms, no gradient on last echo, will not start beta blocker at this time.   5. HTN - at goal, continue current meds   F/u 6 months       [name], M.D.

## 2015-08-14 DIAGNOSIS — E161 Other hypoglycemia: Secondary | ICD-10-CM | POA: Diagnosis not present

## 2015-08-14 DIAGNOSIS — E784 Other hyperlipidemia: Secondary | ICD-10-CM | POA: Diagnosis not present

## 2015-08-14 DIAGNOSIS — E119 Type 2 diabetes mellitus without complications: Secondary | ICD-10-CM | POA: Diagnosis not present

## 2015-08-14 DIAGNOSIS — I5189 Other ill-defined heart diseases: Secondary | ICD-10-CM | POA: Diagnosis not present

## 2015-08-14 DIAGNOSIS — I10 Essential (primary) hypertension: Secondary | ICD-10-CM | POA: Diagnosis not present

## 2015-08-22 ENCOUNTER — Telehealth: Payer: Self-pay | Admitting: *Deleted

## 2015-08-22 NOTE — Telephone Encounter (Signed)
Pt says Dr. Hoyt Koch will be extracting some teeth and prior to scheduling date, pt would like instructions on Eliquis. Our office hasn't received a clearance letter as of yet. Will fax Dr. Lupita Leash office for exact procedure and type of anesthesia

## 2015-08-25 DIAGNOSIS — M10071 Idiopathic gout, right ankle and foot: Secondary | ICD-10-CM | POA: Diagnosis not present

## 2015-08-25 DIAGNOSIS — M10261 Drug-induced gout, right knee: Secondary | ICD-10-CM | POA: Diagnosis not present

## 2015-08-25 DIAGNOSIS — Z0000 Encounter for general adult medical examination without abnormal findings: Secondary | ICD-10-CM | POA: Diagnosis not present

## 2015-08-25 DIAGNOSIS — I482 Chronic atrial fibrillation: Secondary | ICD-10-CM | POA: Diagnosis not present

## 2015-08-25 DIAGNOSIS — E119 Type 2 diabetes mellitus without complications: Secondary | ICD-10-CM | POA: Diagnosis not present

## 2015-08-25 DIAGNOSIS — I10 Essential (primary) hypertension: Secondary | ICD-10-CM | POA: Diagnosis not present

## 2015-08-25 DIAGNOSIS — M25511 Pain in right shoulder: Secondary | ICD-10-CM | POA: Diagnosis not present

## 2015-08-25 DIAGNOSIS — Z1389 Encounter for screening for other disorder: Secondary | ICD-10-CM | POA: Diagnosis not present

## 2015-08-25 DIAGNOSIS — Z23 Encounter for immunization: Secondary | ICD-10-CM | POA: Diagnosis not present

## 2015-09-09 ENCOUNTER — Other Ambulatory Visit: Payer: Self-pay | Admitting: Cardiology

## 2015-10-08 ENCOUNTER — Other Ambulatory Visit: Payer: Self-pay | Admitting: Cardiology

## 2015-10-13 DIAGNOSIS — E161 Other hypoglycemia: Secondary | ICD-10-CM | POA: Diagnosis not present

## 2015-10-13 DIAGNOSIS — I10 Essential (primary) hypertension: Secondary | ICD-10-CM | POA: Diagnosis not present

## 2015-10-13 DIAGNOSIS — I5189 Other ill-defined heart diseases: Secondary | ICD-10-CM | POA: Diagnosis not present

## 2015-10-13 DIAGNOSIS — E119 Type 2 diabetes mellitus without complications: Secondary | ICD-10-CM | POA: Diagnosis not present

## 2015-10-13 DIAGNOSIS — E784 Other hyperlipidemia: Secondary | ICD-10-CM | POA: Diagnosis not present

## 2015-10-14 DIAGNOSIS — M10071 Idiopathic gout, right ankle and foot: Secondary | ICD-10-CM | POA: Diagnosis not present

## 2015-10-14 DIAGNOSIS — E1165 Type 2 diabetes mellitus with hyperglycemia: Secondary | ICD-10-CM | POA: Diagnosis not present

## 2015-10-14 DIAGNOSIS — I482 Chronic atrial fibrillation: Secondary | ICD-10-CM | POA: Diagnosis not present

## 2015-10-14 DIAGNOSIS — I10 Essential (primary) hypertension: Secondary | ICD-10-CM | POA: Diagnosis not present

## 2015-11-14 ENCOUNTER — Encounter: Payer: Medicare Other | Admitting: Internal Medicine

## 2015-11-28 DIAGNOSIS — I890 Lymphedema, not elsewhere classified: Secondary | ICD-10-CM | POA: Diagnosis not present

## 2015-11-28 DIAGNOSIS — I10 Essential (primary) hypertension: Secondary | ICD-10-CM | POA: Diagnosis not present

## 2015-11-28 DIAGNOSIS — I482 Chronic atrial fibrillation: Secondary | ICD-10-CM | POA: Diagnosis not present

## 2015-11-28 DIAGNOSIS — E1165 Type 2 diabetes mellitus with hyperglycemia: Secondary | ICD-10-CM | POA: Diagnosis not present

## 2015-12-05 ENCOUNTER — Ambulatory Visit (INDEPENDENT_AMBULATORY_CARE_PROVIDER_SITE_OTHER): Payer: Medicare Other | Admitting: Internal Medicine

## 2015-12-05 ENCOUNTER — Encounter: Payer: Self-pay | Admitting: Internal Medicine

## 2015-12-05 VITALS — BP 122/72 | HR 77 | Ht 65.0 in | Wt 184.0 lb

## 2015-12-05 DIAGNOSIS — I10 Essential (primary) hypertension: Secondary | ICD-10-CM

## 2015-12-05 DIAGNOSIS — I421 Obstructive hypertrophic cardiomyopathy: Secondary | ICD-10-CM

## 2015-12-05 DIAGNOSIS — I495 Sick sinus syndrome: Secondary | ICD-10-CM | POA: Diagnosis not present

## 2015-12-05 DIAGNOSIS — I4891 Unspecified atrial fibrillation: Secondary | ICD-10-CM | POA: Diagnosis not present

## 2015-12-05 DIAGNOSIS — Z950 Presence of cardiac pacemaker: Secondary | ICD-10-CM

## 2015-12-05 LAB — CUP PACEART INCLINIC DEVICE CHECK
Battery Impedance: 1080 Ohm
Battery Remaining Longevity: 61 mo
Battery Voltage: 2.79 V
Brady Statistic RV Percent Paced: 41 %
Date Time Interrogation Session: 20170728141059
Implantable Lead Implant Date: 19980116
Implantable Lead Location: 753859
Implantable Lead Location: 753860
Implantable Lead Model: 5534
Lead Channel Impedance Value: 67 Ohm
Lead Channel Setting Pacing Pulse Width: 0.4 ms
Lead Channel Setting Sensing Sensitivity: 4 mV
MDC IDC LEAD IMPLANT DT: 19980116
MDC IDC MSMT LEADCHNL RV IMPEDANCE VALUE: 1351 Ohm
MDC IDC MSMT LEADCHNL RV PACING THRESHOLD AMPLITUDE: 0.5 V
MDC IDC MSMT LEADCHNL RV PACING THRESHOLD PULSEWIDTH: 0.4 ms
MDC IDC MSMT LEADCHNL RV SENSING INTR AMPL: 11.2 mV
MDC IDC SET LEADCHNL RV PACING AMPLITUDE: 2.5 V

## 2015-12-05 NOTE — Progress Notes (Signed)
Megan Burly, MD: Primary Cardiologist:  Dr Julio Alm is a 82 y.o. female with a h/o permanent atrial fibrillation and symptomatic bradycardia sp PPM (MDT) who presents today to follow-up in the Electrophysiology device clinic.  Her initial pacemaker was implanted in the 1990s for syncope and bradycardia. She underwent gen change by Dr Reinaldo Berber 05/31/2008.    She is doing well.  She has stable SOB.  She is tolerating eliquis without difficulty.   Today, she  denies symptoms of chest pain, shortness of breath, orthopnea, PND, lower extremity edema,  presyncope, syncope, or neurologic sequela.  The patientis tolerating medications without difficulties and is otherwise without complaint today.   Past Medical History:  Diagnosis Date  . Anxiety   . CHF (congestive heart failure) ([place])   . Coronary artery disease   . Diabetes mellitus without complication ([place])   . Dysrhythmia    a-fib  . Fatty tumor   . Hypertension   . Myocardial infarction ([place])   . Permanent atrial fibrillation ([place])   . Presence of permanent cardiac pacemaker    MDT ADDR01/Implanted: 05/31/08  . Sleep apnea   . Symptomatic bradycardia    Past Surgical History:  Procedure Laterality Date  . ABDOMINAL HYSTERECTOMY    . fatty tumor removal to left arm    . PACEMAKER GENERATOR CHANGE  05/31/08   at [facility]: MDT ADDR01/Implanted: 05/31/08 as gen change by Dr Reinaldo Berber  . PACEMAKER INSERTION  1992    Social History   Social History  . Marital status: Single    Spouse name: N/A  . Number of children: N/A  . Years of education: N/A   Occupational History  . Not on file.   Social History Main Topics  . Smoking status: Former Smoker    Packs/day: 1.00    Years: 30.00    Types: Cigarettes    Start date: 08/22/1944    Quit date: 05/10/1974  . Smokeless tobacco: Never Used  . Alcohol use No  . Drug use: No  . Sexual activity: No   Other Topics Concern  . Not on file   Social History  Narrative   Lives in [place]    Family History  Problem Relation Age of Onset  . Heart failure Mother     Allergies  Allergen Reactions  . Atorvastatin Swelling    Coughing    Current Outpatient Prescriptions  Medication Sig Dispense Refill  . allopurinol (ZYLOPRIM) 300 MG tablet Take 300 mg by mouth daily.    Marland Kitchen ELIQUIS 5 MG TABS tablet TAKE 1 TABLET BY MOUTH TWICE DAILY 60 tablet 3  . furosemide (LASIX) 20 MG tablet Take 10 mg by mouth daily.     . hydrochlorothiazide (HYDRODIURIL) 25 MG tablet Take 25 mg by mouth daily.    . insulin aspart protamine- aspart (NOVOLOG MIX 70/30) (70-30) 100 UNIT/ML injection Inject 15 Units into the skin daily with breakfast. Per sliding scale    . lisinopril (PRINIVIL,ZESTRIL) 20 MG tablet Take 20 mg by mouth daily.    . simvastatin (ZOCOR) 40 MG tablet Take 40 mg by mouth daily.    . traMADol (ULTRAM) 50 MG tablet Take 50 mg by mouth 3 (three) times daily.      No current facility-administered medications for this visit.     ROS- all systems are reviewed and negative except as per HPI  Physical Exam: Vitals:   12/05/15 0944  BP: 122/72  Pulse: 77  SpO2:  99%  Weight: 184 lb (83.5 kg)  Height: 5\' 5"  (1.651 m)    GEN- The patient is elderly appearing, alert and oriented x 3 today.   Head- normocephalic, atraumatic Eyes-  Sclera clear, conjunctiva pink Ears- hearing intact Oropharynx- clear Neck- supple,  Lungs- Clear to ausculation bilaterally, normal work of breathing Chest- pacemaker pocket is well healed Heart- irregular rate and rhythm, no murmurs, rubs or gallops, PMI not laterally displaced GI- soft, NT, ND, + BS Extremities- no clubbing, cyanosis, or edema MS- no significant deformity or atrophy Skin- no rash or lesion Psych- euthymic mood, full affect Neuro- strength and sensation are intact  Pacemaker interrogation- reviewed in detail today,  See PACEART report  Assessment and Plan:  1. Tachy/brady syndrome Normal  pacemaker function See Pace Art report No changes today V rates appear stable  2. Permanent afib On eliquis She says Dr Maxcine Ham is following CBC and bmet I have asked her to request that these be forwarded to our office also.  3. HTN Stable No change required today  4. Chronic diastolic dysfunction Stable No change required today  carelink Return to see me in 1 year Follow-up with Dr Harl Bowie as scheduled  Thompson Grayer MD, [facility] 12/05/2015 10:43 AM

## 2015-12-05 NOTE — Patient Instructions (Addendum)
Medication Instructions:   Your physician recommends that you continue on your current medications as directed. Please refer to the Current Medication list given to you today.  Labwork: NONE  Testing/Procedures:  Device check from home on 03/08/16.  Follow-Up: Your physician recommends that you schedule a follow-up appointment in: 1 year with Dr. Rayann Heman. Please schedule this appointment today before leaving the office.   Any Other Special Instructions Will Be Listed Below (If Applicable).  If you need a refill on your cardiac medications before your next appointment, please call your pharmacy.

## 2015-12-11 ENCOUNTER — Encounter: Payer: Self-pay | Admitting: Internal Medicine

## 2015-12-12 DIAGNOSIS — E784 Other hyperlipidemia: Secondary | ICD-10-CM | POA: Diagnosis not present

## 2015-12-12 DIAGNOSIS — E119 Type 2 diabetes mellitus without complications: Secondary | ICD-10-CM | POA: Diagnosis not present

## 2015-12-12 DIAGNOSIS — I10 Essential (primary) hypertension: Secondary | ICD-10-CM | POA: Diagnosis not present

## 2015-12-12 DIAGNOSIS — I5189 Other ill-defined heart diseases: Secondary | ICD-10-CM | POA: Diagnosis not present

## 2015-12-12 DIAGNOSIS — E161 Other hypoglycemia: Secondary | ICD-10-CM | POA: Diagnosis not present

## 2016-01-16 DIAGNOSIS — I482 Chronic atrial fibrillation: Secondary | ICD-10-CM | POA: Diagnosis not present

## 2016-01-16 DIAGNOSIS — I10 Essential (primary) hypertension: Secondary | ICD-10-CM | POA: Diagnosis not present

## 2016-01-16 DIAGNOSIS — E1165 Type 2 diabetes mellitus with hyperglycemia: Secondary | ICD-10-CM | POA: Diagnosis not present

## 2016-01-21 DIAGNOSIS — R0602 Shortness of breath: Secondary | ICD-10-CM | POA: Diagnosis not present

## 2016-01-21 DIAGNOSIS — I10 Essential (primary) hypertension: Secondary | ICD-10-CM | POA: Diagnosis not present

## 2016-01-21 DIAGNOSIS — E1165 Type 2 diabetes mellitus with hyperglycemia: Secondary | ICD-10-CM | POA: Diagnosis not present

## 2016-02-05 DIAGNOSIS — H538 Other visual disturbances: Secondary | ICD-10-CM | POA: Diagnosis not present

## 2016-02-05 DIAGNOSIS — H2513 Age-related nuclear cataract, bilateral: Secondary | ICD-10-CM | POA: Diagnosis not present

## 2016-02-05 DIAGNOSIS — H40003 Preglaucoma, unspecified, bilateral: Secondary | ICD-10-CM | POA: Diagnosis not present

## 2016-02-06 ENCOUNTER — Other Ambulatory Visit: Payer: Self-pay | Admitting: Cardiology

## 2016-02-10 DIAGNOSIS — E784 Other hyperlipidemia: Secondary | ICD-10-CM | POA: Diagnosis not present

## 2016-02-10 DIAGNOSIS — E161 Other hypoglycemia: Secondary | ICD-10-CM | POA: Diagnosis not present

## 2016-02-10 DIAGNOSIS — I10 Essential (primary) hypertension: Secondary | ICD-10-CM | POA: Diagnosis not present

## 2016-02-10 DIAGNOSIS — I5189 Other ill-defined heart diseases: Secondary | ICD-10-CM | POA: Diagnosis not present

## 2016-02-10 DIAGNOSIS — E119 Type 2 diabetes mellitus without complications: Secondary | ICD-10-CM | POA: Diagnosis not present

## 2016-02-15 DIAGNOSIS — Z23 Encounter for immunization: Secondary | ICD-10-CM | POA: Diagnosis not present

## 2016-03-08 ENCOUNTER — Ambulatory Visit (INDEPENDENT_AMBULATORY_CARE_PROVIDER_SITE_OTHER): Payer: Medicare Other | Admitting: *Deleted

## 2016-03-08 ENCOUNTER — Telehealth: Payer: Self-pay | Admitting: Cardiology

## 2016-03-08 DIAGNOSIS — I495 Sick sinus syndrome: Secondary | ICD-10-CM | POA: Diagnosis not present

## 2016-03-08 NOTE — Telephone Encounter (Signed)
LMOVM reminding pt to send remote transmission.   

## 2016-03-11 NOTE — Progress Notes (Signed)
Remote pacemaker transmission.   

## 2016-03-12 ENCOUNTER — Encounter: Payer: Self-pay | Admitting: Cardiology

## 2016-03-18 LAB — CUP PACEART REMOTE DEVICE CHECK
Brady Statistic RV Percent Paced: 48 %
Implantable Lead Location: 753859
Implantable Lead Location: 753860
Implantable Lead Model: 5034
Implantable Lead Model: 5534
Implantable Pulse Generator Implant Date: 20100122
Lead Channel Impedance Value: 1398 Ohm
Lead Channel Impedance Value: 67 Ohm
Lead Channel Pacing Threshold Amplitude: 0.5 V
Lead Channel Pacing Threshold Pulse Width: 0.4 ms
Lead Channel Setting Pacing Amplitude: 2.5 V
MDC IDC LEAD IMPLANT DT: 19980116
MDC IDC LEAD IMPLANT DT: 19980116
MDC IDC MSMT BATTERY IMPEDANCE: 1240 Ohm
MDC IDC MSMT BATTERY REMAINING LONGEVITY: 55 mo
MDC IDC MSMT BATTERY VOLTAGE: 2.79 V
MDC IDC SESS DTM: 20171031194615
MDC IDC SET LEADCHNL RV PACING PULSEWIDTH: 0.4 ms
MDC IDC SET LEADCHNL RV SENSING SENSITIVITY: 4 mV

## 2016-04-10 DIAGNOSIS — I10 Essential (primary) hypertension: Secondary | ICD-10-CM | POA: Diagnosis not present

## 2016-04-10 DIAGNOSIS — E161 Other hypoglycemia: Secondary | ICD-10-CM | POA: Diagnosis not present

## 2016-04-10 DIAGNOSIS — I5189 Other ill-defined heart diseases: Secondary | ICD-10-CM | POA: Diagnosis not present

## 2016-04-10 DIAGNOSIS — E784 Other hyperlipidemia: Secondary | ICD-10-CM | POA: Diagnosis not present

## 2016-04-10 DIAGNOSIS — E119 Type 2 diabetes mellitus without complications: Secondary | ICD-10-CM | POA: Diagnosis not present

## 2016-04-22 DIAGNOSIS — M1009 Idiopathic gout, multiple sites: Secondary | ICD-10-CM | POA: Diagnosis not present

## 2016-04-22 DIAGNOSIS — I10 Essential (primary) hypertension: Secondary | ICD-10-CM | POA: Diagnosis not present

## 2016-04-22 DIAGNOSIS — I482 Chronic atrial fibrillation: Secondary | ICD-10-CM | POA: Diagnosis not present

## 2016-04-22 DIAGNOSIS — E1165 Type 2 diabetes mellitus with hyperglycemia: Secondary | ICD-10-CM | POA: Diagnosis not present

## 2016-05-22 DIAGNOSIS — E784 Other hyperlipidemia: Secondary | ICD-10-CM | POA: Diagnosis not present

## 2016-05-22 DIAGNOSIS — E161 Other hypoglycemia: Secondary | ICD-10-CM | POA: Diagnosis not present

## 2016-05-22 DIAGNOSIS — E119 Type 2 diabetes mellitus without complications: Secondary | ICD-10-CM | POA: Diagnosis not present

## 2016-05-22 DIAGNOSIS — I5189 Other ill-defined heart diseases: Secondary | ICD-10-CM | POA: Diagnosis not present

## 2016-05-22 DIAGNOSIS — I10 Essential (primary) hypertension: Secondary | ICD-10-CM | POA: Diagnosis not present

## 2016-06-10 ENCOUNTER — Ambulatory Visit (INDEPENDENT_AMBULATORY_CARE_PROVIDER_SITE_OTHER): Payer: Medicare HMO | Admitting: *Deleted

## 2016-06-10 ENCOUNTER — Telehealth: Payer: Self-pay | Admitting: Cardiology

## 2016-06-10 DIAGNOSIS — I495 Sick sinus syndrome: Secondary | ICD-10-CM | POA: Diagnosis not present

## 2016-06-10 NOTE — Telephone Encounter (Signed)
LMOVM reminding pt to send remote transmission.   

## 2016-06-11 IMAGING — CR DG CHEST 1V PORT
1 series · 1 of 1 positions shown · non-contrast
Comparison: 09/16/2010

CLINICAL DATA: Short of breath today

EXAM:
PORTABLE CHEST - 1 VIEW

[AP]
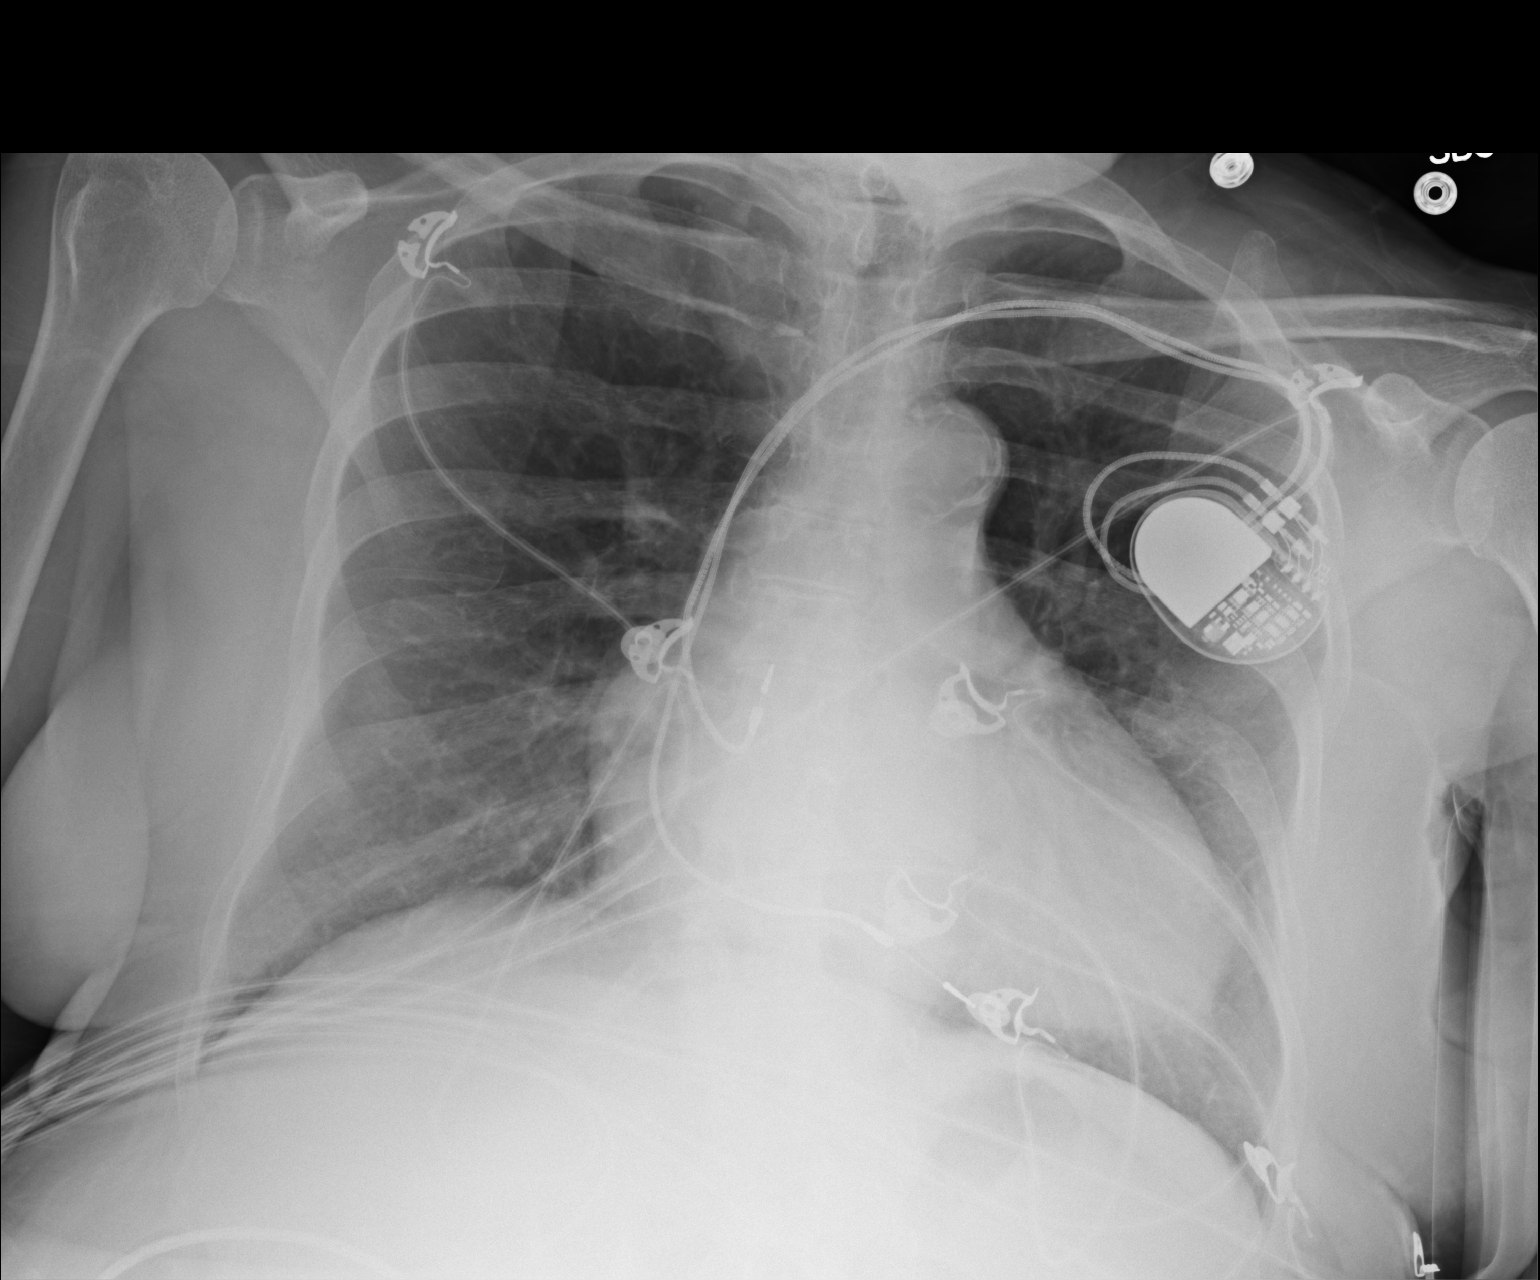

[1 of 1 positions shown; findings below may reference images not displayed]

FINDINGS: The heart is moderately enlarged. Normal vascularity. Clear lungs.
Dual lead left subclavian pacemaker and leads are stable and intact.
No pneumothorax. No pleural effusion.
IMPRESSION: No active disease.

## 2016-06-14 NOTE — Progress Notes (Signed)
Remote pacemaker transmission.   

## 2016-06-16 ENCOUNTER — Encounter: Payer: Self-pay | Admitting: Cardiology

## 2016-06-20 LAB — CUP PACEART REMOTE DEVICE CHECK
Battery Impedance: 1347 Ohm
Battery Voltage: 2.79 V
Date Time Interrogation Session: 20180205153950
Implantable Lead Implant Date: 19980116
Implantable Lead Implant Date: 19980116
Implantable Lead Location: 753859
Implantable Lead Location: 753860
Implantable Lead Model: 5034
Implantable Lead Model: 5534
Implantable Pulse Generator Implant Date: 20100122
Lead Channel Impedance Value: 67 Ohm
Lead Channel Pacing Threshold Amplitude: 0.5 V
Lead Channel Pacing Threshold Pulse Width: 0.4 ms
Lead Channel Setting Pacing Amplitude: 2.5 V
Lead Channel Setting Pacing Pulse Width: 0.4 ms
Lead Channel Setting Sensing Sensitivity: 4 mV
MDC IDC MSMT BATTERY REMAINING LONGEVITY: 51 mo
MDC IDC MSMT LEADCHNL RV IMPEDANCE VALUE: 1289 Ohm
MDC IDC STAT BRADY RV PERCENT PACED: 48 %

## 2016-07-22 DIAGNOSIS — I10 Essential (primary) hypertension: Secondary | ICD-10-CM | POA: Diagnosis not present

## 2016-07-22 DIAGNOSIS — E1165 Type 2 diabetes mellitus with hyperglycemia: Secondary | ICD-10-CM | POA: Diagnosis not present

## 2016-07-22 DIAGNOSIS — M1009 Idiopathic gout, multiple sites: Secondary | ICD-10-CM | POA: Diagnosis not present

## 2016-07-22 DIAGNOSIS — Z131 Encounter for screening for diabetes mellitus: Secondary | ICD-10-CM | POA: Diagnosis not present

## 2016-07-22 DIAGNOSIS — I482 Chronic atrial fibrillation: Secondary | ICD-10-CM | POA: Diagnosis not present

## 2016-07-22 DIAGNOSIS — Z1329 Encounter for screening for other suspected endocrine disorder: Secondary | ICD-10-CM | POA: Diagnosis not present

## 2016-08-08 DIAGNOSIS — I10 Essential (primary) hypertension: Secondary | ICD-10-CM | POA: Diagnosis not present

## 2016-08-08 DIAGNOSIS — E784 Other hyperlipidemia: Secondary | ICD-10-CM | POA: Diagnosis not present

## 2016-08-08 DIAGNOSIS — E119 Type 2 diabetes mellitus without complications: Secondary | ICD-10-CM | POA: Diagnosis not present

## 2016-08-08 DIAGNOSIS — I5189 Other ill-defined heart diseases: Secondary | ICD-10-CM | POA: Diagnosis not present

## 2016-09-09 ENCOUNTER — Telehealth: Payer: Self-pay | Admitting: Cardiology

## 2016-09-09 ENCOUNTER — Encounter: Payer: Medicare HMO | Admitting: *Deleted

## 2016-09-09 NOTE — Telephone Encounter (Signed)
Attempted to confirm remote transmission with pt. No answer and was unable to leave a message. Automated message stated that the number is not a working number.

## 2016-09-17 ENCOUNTER — Encounter: Payer: Self-pay | Admitting: Cardiology

## 2016-09-28 ENCOUNTER — Telehealth: Payer: Self-pay | Admitting: Cardiology

## 2016-09-28 ENCOUNTER — Ambulatory Visit (INDEPENDENT_AMBULATORY_CARE_PROVIDER_SITE_OTHER): Payer: Medicare Other | Admitting: *Deleted

## 2016-09-28 DIAGNOSIS — I495 Sick sinus syndrome: Secondary | ICD-10-CM | POA: Diagnosis not present

## 2016-09-28 NOTE — Telephone Encounter (Signed)
Having tooth extracted Sep 30, 2016   Stated she was told by Dr Harl Bowie to contact our office prior to having this done?

## 2016-09-28 NOTE — Telephone Encounter (Signed)
Patient notified and verbalized understanding. 

## 2016-09-28 NOTE — Telephone Encounter (Signed)
Return call- Megan Walsh reports that she is ready to do her test. She is referring to her remote transmission. I advised her that she can do her transmission with her home monitor at any time she wishes- it does not require [place] to be on the phone. She verbalizes understanding of instructions and reports that she has all of the necessary equipment.

## 2016-09-28 NOTE — Telephone Encounter (Signed)
Stop eliquis 2 days prior to procedure, resume the day after   J BrancH MD

## 2016-09-28 NOTE — Telephone Encounter (Signed)
Attempted to call patient.  Unable to leave message.

## 2016-09-28 NOTE — Telephone Encounter (Signed)
New Message: ° ° ° ° °Please call. °

## 2016-09-28 NOTE — Telephone Encounter (Signed)
Patient on Eliquis. Wants instructions on how to take medication prior to and after tooth extraction.

## 2016-09-29 ENCOUNTER — Encounter: Payer: Self-pay | Admitting: Cardiology

## 2016-09-29 NOTE — Progress Notes (Signed)
Remote pacemaker transmission.   

## 2016-09-30 LAB — CUP PACEART REMOTE DEVICE CHECK
Battery Remaining Longevity: 47 mo
Battery Voltage: 2.79 V
Brady Statistic RV Percent Paced: 46 %
Implantable Lead Implant Date: 19980116
Implantable Lead Implant Date: 19980116
Implantable Lead Location: 753859
Implantable Lead Model: 5034
Implantable Lead Model: 5534
Implantable Pulse Generator Implant Date: 20100122
Lead Channel Impedance Value: 1343 Ohm
Lead Channel Impedance Value: 67 Ohm
Lead Channel Pacing Threshold Amplitude: 0.375 V
Lead Channel Pacing Threshold Pulse Width: 0.4 ms
Lead Channel Setting Pacing Pulse Width: 0.4 ms
Lead Channel Setting Sensing Sensitivity: 4 mV
MDC IDC LEAD LOCATION: 753860
MDC IDC MSMT BATTERY IMPEDANCE: 1486 Ohm
MDC IDC SESS DTM: 20180522152922
MDC IDC SET LEADCHNL RV PACING AMPLITUDE: 2.5 V

## 2016-10-04 ENCOUNTER — Emergency Department (HOSPITAL_COMMUNITY): Payer: Medicare Other

## 2016-10-04 ENCOUNTER — Encounter (HOSPITAL_COMMUNITY): Payer: Self-pay

## 2016-10-04 ENCOUNTER — Emergency Department (HOSPITAL_COMMUNITY)
Admission: EM | Admit: 2016-10-04 | Discharge: 2016-10-04 | Disposition: A | Discharge status: Home / Self Care | Payer: Medicare Other | Attending: Emergency Medicine | Admitting: Emergency Medicine

## 2016-10-04 DIAGNOSIS — Z87891 Personal history of nicotine dependence: Secondary | ICD-10-CM | POA: Diagnosis not present

## 2016-10-04 DIAGNOSIS — M10061 Idiopathic gout, right knee: Secondary | ICD-10-CM | POA: Diagnosis not present

## 2016-10-04 DIAGNOSIS — I110 Hypertensive heart disease with heart failure: Secondary | ICD-10-CM | POA: Insufficient documentation

## 2016-10-04 DIAGNOSIS — M109 Gout, unspecified: Secondary | ICD-10-CM

## 2016-10-04 DIAGNOSIS — I517 Cardiomegaly: Secondary | ICD-10-CM | POA: Diagnosis not present

## 2016-10-04 DIAGNOSIS — R9431 Abnormal electrocardiogram [ECG] [EKG]: Secondary | ICD-10-CM | POA: Diagnosis not present

## 2016-10-04 DIAGNOSIS — I509 Heart failure, unspecified: Secondary | ICD-10-CM | POA: Insufficient documentation

## 2016-10-04 DIAGNOSIS — Z79899 Other long term (current) drug therapy: Secondary | ICD-10-CM | POA: Insufficient documentation

## 2016-10-04 DIAGNOSIS — I2510 Atherosclerotic heart disease of native coronary artery without angina pectoris: Secondary | ICD-10-CM | POA: Diagnosis not present

## 2016-10-04 DIAGNOSIS — R600 Localized edema: Secondary | ICD-10-CM | POA: Diagnosis not present

## 2016-10-04 DIAGNOSIS — E119 Type 2 diabetes mellitus without complications: Secondary | ICD-10-CM | POA: Insufficient documentation

## 2016-10-04 DIAGNOSIS — M25561 Pain in right knee: Secondary | ICD-10-CM | POA: Diagnosis present

## 2016-10-04 DIAGNOSIS — R609 Edema, unspecified: Secondary | ICD-10-CM

## 2016-10-04 DIAGNOSIS — M1711 Unilateral primary osteoarthritis, right knee: Secondary | ICD-10-CM | POA: Diagnosis not present

## 2016-10-04 LAB — CBC WITH DIFFERENTIAL/PLATELET
Basophils Absolute: 0 10*3/uL (ref 0.0–0.1)
Basophils Relative: 1 %
EOS PCT: 2 %
Eosinophils Absolute: 0.1 10*3/uL (ref 0.0–0.7)
HCT: 44.2 % (ref 36.0–46.0)
Hemoglobin: 14.1 g/dL (ref 12.0–15.0)
LYMPHS ABS: 1.4 10*3/uL (ref 0.7–4.0)
Lymphocytes Relative: 45 %
MCH: 28.2 pg (ref 26.0–34.0)
MCHC: 31.9 g/dL (ref 30.0–36.0)
MCV: 88.4 fL (ref 78.0–100.0)
MONO ABS: 0.2 10*3/uL (ref 0.1–1.0)
MONOS PCT: 6 %
Neutro Abs: 1.4 10*3/uL — ABNORMAL LOW (ref 1.7–7.7)
Neutrophils Relative %: 46 %
Platelets: 160 10*3/uL (ref 150–400)
RBC: 5 MIL/uL (ref 3.87–5.11)
RDW: 13.7 % (ref 11.5–15.5)
WBC: 3.1 10*3/uL — ABNORMAL LOW (ref 4.0–10.5)

## 2016-10-04 LAB — COMPREHENSIVE METABOLIC PANEL
ALK PHOS: 60 U/L (ref 38–126)
ALT: 16 U/L (ref 14–54)
AST: 32 U/L (ref 15–41)
Albumin: 5.1 g/dL — ABNORMAL HIGH (ref 3.5–5.0)
Anion gap: 13 (ref 5–15)
BUN: 23 mg/dL — AB (ref 6–20)
CHLORIDE: 99 mmol/L — AB (ref 101–111)
CO2: 28 mmol/L (ref 22–32)
CREATININE: 1.15 mg/dL — AB (ref 0.44–1.00)
Calcium: 10.5 mg/dL — ABNORMAL HIGH (ref 8.9–10.3)
GFR calc Af Amer: 50 mL/min — ABNORMAL LOW (ref >60)
GFR calc non Af Amer: 43 mL/min — ABNORMAL LOW (ref >60)
GLUCOSE: 100 mg/dL — AB (ref 65–99)
Potassium: 3.9 mmol/L (ref 3.5–5.1)
SODIUM: 140 mmol/L (ref 135–145)
Total Bilirubin: 2.4 mg/dL — ABNORMAL HIGH (ref 0.3–1.2)
Total Protein: 8.5 g/dL — ABNORMAL HIGH (ref 6.5–8.1)

## 2016-10-04 LAB — URINALYSIS, ROUTINE W REFLEX MICROSCOPIC
BILIRUBIN URINE: NEGATIVE
Glucose, UA: NEGATIVE mg/dL
HGB URINE DIPSTICK: NEGATIVE
KETONES UR: NEGATIVE mg/dL
Leukocytes, UA: NEGATIVE
Nitrite: NEGATIVE
Protein, ur: NEGATIVE mg/dL
SPECIFIC GRAVITY, URINE: 1.01 (ref 1.005–1.030)
pH: 7 (ref 5.0–8.0)

## 2016-10-04 LAB — TROPONIN I

## 2016-10-04 LAB — BRAIN NATRIURETIC PEPTIDE: B Natriuretic Peptide: 102 pg/mL — ABNORMAL HIGH (ref 0.0–100.0)

## 2016-10-04 MED ORDER — MORPHINE SULFATE (PF) 4 MG/ML IV SOLN
4.0000 mg | Freq: Once | INTRAVENOUS | Status: DC
  Filled 2016-10-04: qty 1

## 2016-10-04 MED ORDER — HYDROCODONE-ACETAMINOPHEN 5-325 MG PO TABS
1.0000 | ORAL_TABLET | Freq: Once | ORAL | Status: AC
  Administered 2016-10-04: 1 via ORAL
  Filled 2016-10-04: qty 1

## 2016-10-04 MED ORDER — ONDANSETRON HCL 4 MG/2ML IJ SOLN
4.0000 mg | Freq: Once | INTRAMUSCULAR | Status: DC
  Filled 2016-10-04: qty 2

## 2016-10-04 MED ORDER — FUROSEMIDE 10 MG/ML IJ SOLN
40.0000 mg | Freq: Once | INTRAMUSCULAR | Status: AC
  Administered 2016-10-04: 40 mg via INTRAMUSCULAR

## 2016-10-04 MED ORDER — FUROSEMIDE 10 MG/ML IJ SOLN
40.0000 mg | Freq: Once | INTRAMUSCULAR | Status: DC
  Filled 2016-10-04: qty 4

## 2016-10-04 MED ORDER — HYDROCODONE-ACETAMINOPHEN 5-325 MG PO TABS: 1.0000 | ORAL_TABLET | ORAL | 0 refills | Status: DC | PRN

## 2016-10-04 MED ORDER — DOCUSATE SODIUM 100 MG PO CAPS: 100.0000 mg | ORAL_CAPSULE | Freq: Two times a day (BID) | ORAL | 0 refills | Status: DC

## 2016-10-04 NOTE — ED Notes (Signed)
Pt is difficult stick. In to attempt iv

## 2016-10-04 NOTE — Discharge Instructions (Signed)
Increase your lasix to 40 mg on Tuesday the 29th and Wednesday the 30th.  Then go back to your regular dose.

## 2016-10-04 NOTE — ED Provider Notes (Signed)
[facility] DEPT Provider Note   CSN: [id number] Arrival date & time: 10/04/16  1342     History   Chief Complaint Chief Complaint  Patient presents with  . Leg Swelling    HPI Megan Walsh is a 83 y.o. female.  Pt presents to the ED today with swelling to both legs as well as right knee pain.  The pt does have a hx of gout and feels that is what she has in her right knee.  The pt denies cp or sob.  Pt's sister said pt has been unsteady on her feet since yesterday.      Past Medical History:  Diagnosis Date  . Anxiety   . CHF (congestive heart failure) ([place])   . Coronary artery disease   . Diabetes mellitus without complication ([place])   . Dysrhythmia    a-fib  . Fatty tumor   . Hypertension   . Myocardial infarction ([place])   . Permanent atrial fibrillation ([place])   . Presence of permanent cardiac pacemaker    MDT ADDR01/Implanted: 05/31/08  . Sleep apnea   . Symptomatic bradycardia     Patient Active Problem List   Diagnosis Date Noted  . Tachycardia-bradycardia ([place]) 10/23/2014  . Midsternal chest pain 09/24/2014  . Chest pain 09/22/2014  . CAD (coronary artery disease) 09/22/2014  . Atrial fibrillation with controlled ventricular response ([place]) 09/22/2014  . HTN (hypertension) 09/22/2014  . Diabetes mellitus type 2, insulin dependent ([place]) 09/22/2014  . Pacemaker - MDT ADDR01 Implanted: 05/31/08 Serial# [id number] 09/22/2014  . History of CHF (congestive heart failure) 09/22/2014  . Acute renal failure ([place]) 09/22/2014  . Gout 09/22/2014  . Abnormal EKG     Past Surgical History:  Procedure Laterality Date  . ABDOMINAL HYSTERECTOMY    . fatty tumor removal to left arm    . PACEMAKER GENERATOR CHANGE  05/31/08   at [facility]: MDT ADDR01/Implanted: 05/31/08 as gen change by Dr Reinaldo Berber  . PACEMAKER INSERTION  1992    OB History    No data available       Home Medications    Prior to Admission medications   Medication Sig Start Date End Date  Taking? Authorizing Provider  allopurinol (ZYLOPRIM) 300 MG tablet Take 300 mg by mouth daily.   Yes [provider]  ELIQUIS 5 MG TABS tablet TAKE 1 TABLET BY MOUTH TWICE DAILY 02/06/16  Yes Branch, Alphonse Guild, MD  furosemide (LASIX) 20 MG tablet Take 20 mg by mouth daily.    Yes [provider]  hydrochlorothiazide (HYDRODIURIL) 25 MG tablet Take 25 mg by mouth daily.   Yes [provider]  lisinopril (PRINIVIL,ZESTRIL) 20 MG tablet Take 20 mg by mouth daily.   Yes [provider]  simvastatin (ZOCOR) 40 MG tablet Take 40 mg by mouth daily.   Yes [provider]  docusate sodium (COLACE) 100 MG capsule Take 1 capsule (100 mg total) by mouth every 12 (twelve) hours. 10/04/16   Isla Pence, MD  HYDROcodone-acetaminophen (NORCO/VICODIN) 5-325 MG tablet Take 1 tablet by mouth every 4 (four) hours as needed. 10/04/16   Isla Pence, MD    Family History Family History  Problem Relation Age of Onset  . Heart failure Mother     Social History Social History  Substance Use Topics  . Smoking status: Former Smoker    Packs/day: 1.00    Years: 30.00    Types: Cigarettes    Start date: 08/22/1944    Quit date:  05/10/1974  . Smokeless tobacco: Never Used  . Alcohol use No     Allergies   Atorvastatin   Review of Systems Review of Systems  Musculoskeletal:       Right knee pain.  Bilateral leg swelling.  All other systems reviewed and are negative.    Physical Exam Updated Vital Signs BP (!) 101/54   Pulse 62   Temp 98.6 F (37 C) (Oral)   Resp 16   Ht 5\' 5"  (1.651 m)   Wt 81.6 kg (180 lb)   SpO2 100%   BMI 29.95 kg/m   Physical Exam  Constitutional: She is oriented to person, place, and time. She appears well-developed and well-nourished.  HENT:  Head: Normocephalic and atraumatic.  Right Ear: External ear normal.  Left Ear: External ear normal.  Nose: Nose normal.  Mouth/Throat: Oropharynx is clear and moist.  Eyes:  Conjunctivae and EOM are normal. Pupils are equal, round, and reactive to light.  Neck: Normal range of motion. Neck supple.  Cardiovascular: Normal rate, regular rhythm, normal heart sounds and intact distal pulses.   Pulmonary/Chest: Effort normal and breath sounds normal.  Abdominal: Soft. Bowel sounds are normal.  Musculoskeletal: She exhibits edema.       Right knee: She exhibits swelling. Tenderness found.  Neurological: She is alert and oriented to person, place, and time.  Skin: Skin is warm.  Psychiatric: She has a normal mood and affect. Her behavior is normal. Judgment and thought content normal.  Nursing note and vitals reviewed.    ED Treatments / Results  Labs (all labs ordered are listed, but only abnormal results are displayed) Labs Reviewed  COMPREHENSIVE METABOLIC PANEL - Abnormal; Notable for the following:       Result Value   Chloride 99 (*)    Glucose, Bld 100 (*)    BUN 23 (*)    Creatinine, Ser 1.15 (*)    Calcium 10.5 (*)    Total Protein 8.5 (*)    Albumin 5.1 (*)    Total Bilirubin 2.4 (*)    GFR calc non Af Amer 43 (*)    GFR calc Af Amer 50 (*)    All other components within normal limits  CBC WITH DIFFERENTIAL/PLATELET - Abnormal; Notable for the following:    WBC 3.1 (*)    Neutro Abs 1.4 (*)    All other components within normal limits  BRAIN NATRIURETIC PEPTIDE - Abnormal; Notable for the following:    B Natriuretic Peptide 102.0 (*)    All other components within normal limits  URINALYSIS, ROUTINE W REFLEX MICROSCOPIC  TROPONIN I    EKG  EKG Interpretation  Date/Time:  Monday Oct 04 2016 18:41:01 EDT Ventricular Rate:  61 PR Interval:    QRS Duration: 169 QT Interval:  490 QTC Calculation: 494 R Axis:   -69 Text Interpretation:  Ventricular-paced rhythm No further analysis attempted due to paced rhythm No significant change since last tracing Confirmed by Isla Pence [phone]) on 10/04/2016 6:45:31 PM       Radiology No  results found.  Procedures Procedures (including critical care time)  Medications Ordered in ED Medications  furosemide (LASIX) injection 40 mg (40 mg Intramuscular Given 10/04/16 2056)  HYDROcodone-acetaminophen (NORCO/VICODIN) 5-325 MG per tablet 1 tablet (1 tablet Oral Given 10/04/16 2057)     Initial Impression / Assessment and Plan / ED Course  I have reviewed the triage vital signs and the nursing notes.  Pertinent labs & imaging results that  were available during my care of the patient were reviewed by me and considered in my medical decision making (see chart for details).   labs and treatment were delayed b/c the nurses were unable to get an iv.    Pt is feeling much better.  She has had good uop from the lasix.  She is told to increase her lasix for 2 days.  She is also given lortab #10 and told to return if worse.  Final Clinical Impressions(s) / ED Diagnoses   Final diagnoses:  Peripheral edema  Acute gout of right knee, unspecified cause    New Prescriptions New Prescriptions   DOCUSATE SODIUM (COLACE) 100 MG CAPSULE    Take 1 capsule (100 mg total) by mouth every 12 (twelve) hours.   HYDROCODONE-ACETAMINOPHEN (NORCO/VICODIN) 5-325 MG TABLET    Take 1 tablet by mouth every 4 (four) hours as needed.     Isla Pence, MD 10/04/16 2154

## 2016-10-04 NOTE — ED Triage Notes (Addendum)
Reports of bilateral leg swelling over 1 week. States pain is worse in right leg/right knee.  Patient reports of unable to ambulate dur to pain and swelling in legs. Denies shortness of breath or other complaints.Has hx of gout.

## 2016-10-07 DIAGNOSIS — I10 Essential (primary) hypertension: Secondary | ICD-10-CM | POA: Diagnosis not present

## 2016-10-07 DIAGNOSIS — I5189 Other ill-defined heart diseases: Secondary | ICD-10-CM | POA: Diagnosis not present

## 2016-10-07 DIAGNOSIS — E784 Other hyperlipidemia: Secondary | ICD-10-CM | POA: Diagnosis not present

## 2016-10-07 DIAGNOSIS — E119 Type 2 diabetes mellitus without complications: Secondary | ICD-10-CM | POA: Diagnosis not present

## 2016-10-12 ENCOUNTER — Other Ambulatory Visit: Payer: Self-pay | Admitting: Cardiology

## 2016-10-15 DIAGNOSIS — Z299 Encounter for prophylactic measures, unspecified: Secondary | ICD-10-CM | POA: Diagnosis not present

## 2016-10-15 DIAGNOSIS — K5900 Constipation, unspecified: Secondary | ICD-10-CM | POA: Diagnosis not present

## 2016-10-15 DIAGNOSIS — M109 Gout, unspecified: Secondary | ICD-10-CM | POA: Diagnosis not present

## 2016-10-15 DIAGNOSIS — E7800 Pure hypercholesterolemia, unspecified: Secondary | ICD-10-CM | POA: Diagnosis not present

## 2016-10-15 DIAGNOSIS — E1165 Type 2 diabetes mellitus with hyperglycemia: Secondary | ICD-10-CM | POA: Diagnosis not present

## 2016-10-15 DIAGNOSIS — M25569 Pain in unspecified knee: Secondary | ICD-10-CM | POA: Diagnosis not present

## 2016-10-15 DIAGNOSIS — I4891 Unspecified atrial fibrillation: Secondary | ICD-10-CM | POA: Diagnosis not present

## 2016-10-15 DIAGNOSIS — I10 Essential (primary) hypertension: Secondary | ICD-10-CM | POA: Diagnosis not present

## 2016-10-18 DIAGNOSIS — E7800 Pure hypercholesterolemia, unspecified: Secondary | ICD-10-CM | POA: Diagnosis not present

## 2016-10-18 DIAGNOSIS — M109 Gout, unspecified: Secondary | ICD-10-CM | POA: Diagnosis not present

## 2016-10-18 DIAGNOSIS — E1165 Type 2 diabetes mellitus with hyperglycemia: Secondary | ICD-10-CM | POA: Diagnosis not present

## 2016-10-18 DIAGNOSIS — I4891 Unspecified atrial fibrillation: Secondary | ICD-10-CM | POA: Diagnosis not present

## 2016-10-18 DIAGNOSIS — I10 Essential (primary) hypertension: Secondary | ICD-10-CM | POA: Diagnosis not present

## 2016-10-18 DIAGNOSIS — Z299 Encounter for prophylactic measures, unspecified: Secondary | ICD-10-CM | POA: Diagnosis not present

## 2016-10-18 DIAGNOSIS — M1711 Unilateral primary osteoarthritis, right knee: Secondary | ICD-10-CM | POA: Diagnosis not present

## 2016-10-18 DIAGNOSIS — Z950 Presence of cardiac pacemaker: Secondary | ICD-10-CM | POA: Diagnosis not present

## 2016-11-12 ENCOUNTER — Ambulatory Visit (INDEPENDENT_AMBULATORY_CARE_PROVIDER_SITE_OTHER): Payer: Medicare Other | Admitting: Internal Medicine

## 2016-11-12 ENCOUNTER — Encounter: Payer: Self-pay | Admitting: Internal Medicine

## 2016-11-12 VITALS — BP 105/66 | HR 98 | Ht 65.0 in | Wt 170.8 lb

## 2016-11-12 DIAGNOSIS — I10 Essential (primary) hypertension: Secondary | ICD-10-CM | POA: Diagnosis not present

## 2016-11-12 DIAGNOSIS — I4891 Unspecified atrial fibrillation: Secondary | ICD-10-CM | POA: Diagnosis not present

## 2016-11-12 DIAGNOSIS — I495 Sick sinus syndrome: Secondary | ICD-10-CM

## 2016-11-12 MED ORDER — METOPROLOL SUCCINATE ER 25 MG PO TB24: 25.0000 mg | ORAL_TABLET | Freq: Every day | ORAL | 6 refills | Status: DC

## 2016-11-12 MED ORDER — LISINOPRIL 10 MG PO TABS: 10.0000 mg | ORAL_TABLET | Freq: Every day | ORAL | 6 refills | Status: DC

## 2016-11-12 NOTE — Progress Notes (Signed)
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  Since last being seen in our clinic, the patient reports doing reasonably well.  She was seen at [facility] ED about 6 weeks ago with acute on chronic diastolic dysfunction.  She has diuresed with hctz since that time and seems to be doing better.  Her primary concern today is with arthritis.   She has occasionally low BP.  She also has rare tachypalpitations.  Today, she denies symptoms of chest pain, shortness of breath,  dizziness, presyncope, or syncope.  The patient is otherwise without complaint today.   Past Medical History:  Diagnosis Date  . Anxiety   . CHF (congestive heart failure) ([place])   . Coronary artery disease   . Diabetes mellitus without complication ([place])   . Dysrhythmia    a-fib  . Fatty tumor   . Hypertension   . Myocardial infarction ([place])   . Permanent atrial fibrillation ([place])   . Presence of permanent cardiac pacemaker    MDT ADDR01/Implanted: 05/31/08  . Sleep apnea   . Symptomatic bradycardia    Past Surgical History:  Procedure Laterality Date  . ABDOMINAL HYSTERECTOMY    . fatty tumor removal to left arm    . PACEMAKER GENERATOR CHANGE  05/31/08   at [facility]: MDT ADDR01/Implanted: 05/31/08 as gen change by Dr Reinaldo Berber  . PACEMAKER INSERTION  1992    ROS- all systems are reviewed and negative except as per HPI above  Current Outpatient Prescriptions  Medication Sig Dispense Refill  . allopurinol (ZYLOPRIM) 300 MG tablet Take 300 mg by mouth daily.    Marland Kitchen docusate sodium (COLACE) 100 MG capsule Take 1 capsule (100 mg total) by mouth every 12 (twelve) hours. 20 capsule 0  . ELIQUIS 5 MG TABS tablet TAKE 1 TABLET BY MOUTH TWICE DAILY 60 tablet 0  . furosemide (LASIX) 20 MG tablet Take 20 mg by mouth daily.     . hydrochlorothiazide (HYDRODIURIL) 25 MG tablet Take 25 mg by mouth daily.    Marland Kitchen  HYDROcodone-acetaminophen (NORCO/VICODIN) 5-325 MG tablet Take 1 tablet by mouth every 4 (four) hours as needed. 10 tablet 0  . lisinopril (PRINIVIL,ZESTRIL) 20 MG tablet Take 20 mg by mouth daily.    . simvastatin (ZOCOR) 40 MG tablet Take 40 mg by mouth daily.     No current facility-administered medications for this visit.     Physical Exam: Vitals:   11/12/16 1033  BP: 105/66  Pulse: 98  SpO2: 99%  Weight: 170 lb 12.8 oz (77.5 kg)  Height: 5\' 5"  (1.651 m)    GEN- The patient is overweight and elderly appearing, alert and oriented x 3 today.   Head- normocephalic, atraumatic Eyes-  Sclera clear, conjunctiva pink Ears- hearing intact Oropharynx- clear Lungs- Clear to ausculation bilaterally, normal work of breathing Chest- pacemaker pocket is well healed Heart- irregular rate and rhythm  GI- soft, NT, ND, + BS Extremities- no clubbing, cyanosis, +1edema  Pacemaker interrogation- reviewed in detail today,  See PACEART report  Assessment and Plan:  1. Bradycardia/ tachycardia syndrome Normal pacemaker function See Pace Art report No changes today  2. Permanent afib On eliquis PCP to follow bmet,  Labs 10/04/16 are reviewed Frequent RVR is noted Will add metoprolol succinate 25mg  daily  3. HTN Add metoprolol as above Reduce lisinopril to 10mg  daily given low BP readings at home  4. Chronic diastolic  dysfunction euvolemic today 2 gram sodium restriction Overdue to see Dr Harl Bowie  carelink Return to see me in 1 year unless problems arise Follow-up with Dr Harl Bowie in the next few months (routine follow-up)  Thompson Grayer MD, [facility] 11/12/2016 10:43 AM

## 2016-11-12 NOTE — Patient Instructions (Addendum)
Medication Instructions:   Begin Toprol XL 25mg  daily.  Decrease Lisinopril to 10mg  daily.  Continue all other medications.    Labwork: none  Testing/Procedures: none  Follow-Up:  Your physician wants you to follow up in:  1 year.  You will receive a reminder letter in the mail one-two months in advance.  If you don't receive a letter, please call our office to schedule the follow up appointment.  (DR. ALLRED)   3 months (DR. Harl Bowie)  Any Other Special Instructions Will Be Listed Below (If Applicable).  Remote monitoring is used to monitor your Pacemaker of ICD from home. This monitoring reduces the number of office visits required to check your device to one time per year. It allows [place] to keep an eye on the functioning of your device to ensure it is working properly. You are scheduled for a device check from home on 12-28-2016. You may send your transmission at any time that day. If you have a wireless device, the transmission will be sent automatically. After your physician reviews your transmission, you will receive a postcard with your next transmission date.  2 gram sodium diet   If you need a refill on your cardiac medications before your next appointment, please call your pharmacy.

## 2016-11-16 LAB — CUP PACEART INCLINIC DEVICE CHECK
Battery Voltage: 2.78 V
Brady Statistic RV Percent Paced: 47 %
Date Time Interrogation Session: 20180706150156
Implantable Lead Implant Date: 19980116
Implantable Lead Location: 753859
Implantable Lead Location: 753860
Implantable Lead Model: 5034
Implantable Lead Model: 5534
Implantable Pulse Generator Implant Date: 20100122
Lead Channel Impedance Value: 1460 Ohm
Lead Channel Impedance Value: 1460 Ohm
Lead Channel Impedance Value: 67 Ohm
Lead Channel Pacing Threshold Amplitude: 0.375 V
Lead Channel Pacing Threshold Amplitude: 0.375 V
Lead Channel Pacing Threshold Pulse Width: 0.4 ms
Lead Channel Pacing Threshold Pulse Width: 0.4 ms
Lead Channel Sensing Intrinsic Amplitude: 15.67 mV
Lead Channel Setting Pacing Amplitude: 2.5 V
Lead Channel Setting Sensing Sensitivity: 5.6 mV
MDC IDC LEAD IMPLANT DT: 19980116
MDC IDC MSMT BATTERY IMPEDANCE: 1486 Ohm
MDC IDC MSMT BATTERY REMAINING LONGEVITY: 47 mo
MDC IDC MSMT LEADCHNL RA IMPEDANCE VALUE: 67 Ohm
MDC IDC MSMT LEADCHNL RV PACING THRESHOLD AMPLITUDE: 0.25 V
MDC IDC MSMT LEADCHNL RV PACING THRESHOLD AMPLITUDE: 0.25 V
MDC IDC MSMT LEADCHNL RV PACING THRESHOLD PULSEWIDTH: 0.4 ms
MDC IDC MSMT LEADCHNL RV PACING THRESHOLD PULSEWIDTH: 0.4 ms
MDC IDC MSMT LEADCHNL RV SENSING INTR AMPL: 15.67 mV
MDC IDC SET LEADCHNL RV PACING PULSEWIDTH: 0.4 ms

## 2016-11-17 DIAGNOSIS — M25569 Pain in unspecified knee: Secondary | ICD-10-CM | POA: Diagnosis not present

## 2016-11-17 DIAGNOSIS — E1165 Type 2 diabetes mellitus with hyperglycemia: Secondary | ICD-10-CM | POA: Diagnosis not present

## 2016-11-17 DIAGNOSIS — I10 Essential (primary) hypertension: Secondary | ICD-10-CM | POA: Diagnosis not present

## 2016-11-17 DIAGNOSIS — Z299 Encounter for prophylactic measures, unspecified: Secondary | ICD-10-CM | POA: Diagnosis not present

## 2016-11-17 DIAGNOSIS — R55 Syncope and collapse: Secondary | ICD-10-CM | POA: Diagnosis not present

## 2016-11-17 DIAGNOSIS — E7800 Pure hypercholesterolemia, unspecified: Secondary | ICD-10-CM | POA: Diagnosis not present

## 2016-11-17 DIAGNOSIS — R51 Headache: Secondary | ICD-10-CM | POA: Diagnosis not present

## 2016-11-17 DIAGNOSIS — M1711 Unilateral primary osteoarthritis, right knee: Secondary | ICD-10-CM | POA: Diagnosis not present

## 2016-11-17 DIAGNOSIS — I4891 Unspecified atrial fibrillation: Secondary | ICD-10-CM | POA: Diagnosis not present

## 2016-11-17 DIAGNOSIS — Z6828 Body mass index (BMI) 28.0-28.9, adult: Secondary | ICD-10-CM | POA: Diagnosis not present

## 2016-11-19 ENCOUNTER — Other Ambulatory Visit: Payer: Self-pay | Admitting: Cardiology

## 2016-12-22 DIAGNOSIS — Z299 Encounter for prophylactic measures, unspecified: Secondary | ICD-10-CM | POA: Diagnosis not present

## 2016-12-22 DIAGNOSIS — Z90710 Acquired absence of both cervix and uterus: Secondary | ICD-10-CM | POA: Diagnosis not present

## 2016-12-22 DIAGNOSIS — E1165 Type 2 diabetes mellitus with hyperglycemia: Secondary | ICD-10-CM | POA: Diagnosis not present

## 2016-12-22 DIAGNOSIS — Z6828 Body mass index (BMI) 28.0-28.9, adult: Secondary | ICD-10-CM | POA: Diagnosis not present

## 2016-12-22 DIAGNOSIS — M1711 Unilateral primary osteoarthritis, right knee: Secondary | ICD-10-CM | POA: Diagnosis not present

## 2016-12-22 DIAGNOSIS — R0602 Shortness of breath: Secondary | ICD-10-CM | POA: Diagnosis not present

## 2016-12-22 DIAGNOSIS — E7800 Pure hypercholesterolemia, unspecified: Secondary | ICD-10-CM | POA: Diagnosis not present

## 2016-12-22 DIAGNOSIS — I4891 Unspecified atrial fibrillation: Secondary | ICD-10-CM | POA: Diagnosis not present

## 2016-12-27 DIAGNOSIS — H35033 Hypertensive retinopathy, bilateral: Secondary | ICD-10-CM | POA: Diagnosis not present

## 2016-12-28 ENCOUNTER — Telehealth: Payer: Self-pay | Admitting: Internal Medicine

## 2016-12-28 ENCOUNTER — Encounter: Payer: Medicare Other | Admitting: *Deleted

## 2016-12-28 ENCOUNTER — Telehealth: Payer: Self-pay | Admitting: Cardiology

## 2016-12-28 NOTE — Telephone Encounter (Signed)
Spoke with pt and reminded pt of remote transmission that is due today. Pt verbalized understanding.   

## 2016-12-28 NOTE — Telephone Encounter (Signed)
New message    Pt needs help with transmission, she said she's never done it with the landline.

## 2016-12-29 NOTE — Telephone Encounter (Signed)
Attempted to help pt send transmission, during call phone line got disconnected and was unable to reach pt

## 2016-12-30 ENCOUNTER — Encounter: Payer: Self-pay | Admitting: Cardiology

## 2016-12-31 ENCOUNTER — Telehealth: Payer: Self-pay | Admitting: Cardiology

## 2016-12-31 NOTE — Telephone Encounter (Signed)
New Message:   Please call,she says she is having problem working her device.

## 2016-12-31 NOTE — Telephone Encounter (Signed)
Called 3x. No answer, no VM.

## 2016-12-31 NOTE — Telephone Encounter (Signed)
No answer, no VM

## 2017-01-12 DIAGNOSIS — I10 Essential (primary) hypertension: Secondary | ICD-10-CM | POA: Diagnosis not present

## 2017-01-12 DIAGNOSIS — R55 Syncope and collapse: Secondary | ICD-10-CM | POA: Diagnosis not present

## 2017-01-12 DIAGNOSIS — I4891 Unspecified atrial fibrillation: Secondary | ICD-10-CM | POA: Diagnosis not present

## 2017-01-12 DIAGNOSIS — Z299 Encounter for prophylactic measures, unspecified: Secondary | ICD-10-CM | POA: Diagnosis not present

## 2017-01-12 DIAGNOSIS — Z6828 Body mass index (BMI) 28.0-28.9, adult: Secondary | ICD-10-CM | POA: Diagnosis not present

## 2017-01-12 DIAGNOSIS — E1165 Type 2 diabetes mellitus with hyperglycemia: Secondary | ICD-10-CM | POA: Diagnosis not present

## 2017-01-12 DIAGNOSIS — E7800 Pure hypercholesterolemia, unspecified: Secondary | ICD-10-CM | POA: Diagnosis not present

## 2017-02-11 DIAGNOSIS — Z23 Encounter for immunization: Secondary | ICD-10-CM | POA: Diagnosis not present

## 2017-02-25 ENCOUNTER — Encounter: Payer: Self-pay | Admitting: *Deleted

## 2017-02-25 ENCOUNTER — Ambulatory Visit (INDEPENDENT_AMBULATORY_CARE_PROVIDER_SITE_OTHER): Payer: Medicare Other | Admitting: Cardiology

## 2017-02-25 ENCOUNTER — Encounter: Payer: Self-pay | Admitting: Cardiology

## 2017-02-25 ENCOUNTER — Encounter (INDEPENDENT_AMBULATORY_CARE_PROVIDER_SITE_OTHER): Payer: Self-pay

## 2017-02-25 VITALS — BP 111/67 | HR 71 | Ht 60.0 in | Wt 163.8 lb

## 2017-02-25 DIAGNOSIS — R0789 Other chest pain: Secondary | ICD-10-CM

## 2017-02-25 DIAGNOSIS — I4891 Unspecified atrial fibrillation: Secondary | ICD-10-CM

## 2017-02-25 DIAGNOSIS — I421 Obstructive hypertrophic cardiomyopathy: Secondary | ICD-10-CM | POA: Diagnosis not present

## 2017-02-25 DIAGNOSIS — Z950 Presence of cardiac pacemaker: Secondary | ICD-10-CM

## 2017-02-25 NOTE — Progress Notes (Signed)
Clinical Summary Ms. Macdonell is a 83 y.o.female seen today for follow up of the following medical problems.    1. Chest pain - admitted 09/22/14 with chest pain. Notes describe atypical pain - negative workup for ACS, stress MPI showed small apical infarct, no current ischemia. - 09/3014 echo LVEF 60-65%, no WMAs, mild AI, mild MR, severe asymmetric hypertrophy of the septum reported 2.1 cm, no dynamic gradient.   - no recent symptoms  2. Permanent pacemaker - 11/2016 normal device function - she denies any recent lightheadedness or dizziness  3. Afib - CHAD2Vasc score of 7, high risk. She is on eliquis  - no palpitations. No bleeding on eliquis   4. Hypertrophic CM - no recent SOB/DOE/chest pain/syncope  5. HTN - last visit we decreased lisionpril to 10mg  daily due to low bp's - remains compliant with meds  6. Hyperlipidemia - compliant with statin, reports recent labs with pcp.    7. Lymphedema/LE edema - has compression stockings at home - compliant with diuretics    Past Medical History:  Diagnosis Date  . Anxiety   . CHF (congestive heart failure) ([place])   . Coronary artery disease   . Diabetes mellitus without complication ([place])   . Dysrhythmia    a-fib  . Fatty tumor   . Hypertension   . Myocardial infarction ([place])   . Permanent atrial fibrillation ([place])   . Presence of permanent cardiac pacemaker    MDT ADDR01/Implanted: 05/31/08  . Sleep apnea   . Symptomatic bradycardia      Allergies  Allergen Reactions  . Atorvastatin Swelling and Cough     Current Outpatient Prescriptions  Medication Sig Dispense Refill  . allopurinol (ZYLOPRIM) 300 MG tablet Take 300 mg by mouth daily.    Marland Kitchen docusate sodium (COLACE) 100 MG capsule Take 1 capsule (100 mg total) by mouth every 12 (twelve) hours. 20 capsule 0  . ELIQUIS 5 MG TABS tablet TAKE ONE TABLET BY MOUTH TWICE DAILY 60 tablet 6  . furosemide (LASIX) 20 MG tablet Take 20 mg by mouth  daily.     . hydrochlorothiazide (HYDRODIURIL) 25 MG tablet Take 25 mg by mouth daily.    Marland Kitchen HYDROcodone-acetaminophen (NORCO/VICODIN) 5-325 MG tablet Take 1 tablet by mouth every 4 (four) hours as needed. 10 tablet 0  . lisinopril (PRINIVIL,ZESTRIL) 10 MG tablet Take 1 tablet (10 mg total) by mouth daily. 30 tablet 6  . metoprolol succinate (TOPROL XL) 25 MG 24 hr tablet Take 1 tablet (25 mg total) by mouth daily. 30 tablet 6  . simvastatin (ZOCOR) 40 MG tablet Take 40 mg by mouth daily.     No current facility-administered medications for this visit.      Past Surgical History:  Procedure Laterality Date  . ABDOMINAL HYSTERECTOMY    . fatty tumor removal to left arm    . PACEMAKER GENERATOR CHANGE  05/31/08   at [facility]: MDT ADDR01/Implanted: 05/31/08 as gen change by Dr Reinaldo Berber  . PACEMAKER INSERTION  1992     Allergies  Allergen Reactions  . Atorvastatin Swelling and Cough      Family History  Problem Relation Age of Onset  . Heart failure Mother      Social History Ms. Swindell reports that she quit smoking about 42 years ago. Her smoking use included Cigarettes. She started smoking about 72 years ago. She has a 30.00 pack-year smoking history. She has never used smokeless tobacco. Ms. Breitenstein reports that she does  not drink alcohol.   Review of Systems CONSTITUTIONAL: No weight loss, fever, chills, weakness or fatigue.  HEENT: Eyes: No visual loss, blurred vision, double vision or yellow sclerae.No hearing loss, sneezing, congestion, runny nose or sore throat.  SKIN: No rash or itching.  CARDIOVASCULAR: per hpi RESPIRATORY: No shortness of breath, cough or sputum.  GASTROINTESTINAL: No anorexia, nausea, vomiting or diarrhea. No abdominal pain or blood.  GENITOURINARY: No burning on urination, no polyuria NEUROLOGICAL: No headache, dizziness, syncope, paralysis, ataxia, numbness or tingling in the extremities. No change in bowel or bladder control.  MUSCULOSKELETAL:  No muscle, back pain, joint pain or stiffness.  LYMPHATICS: No enlarged nodes. No history of splenectomy.  PSYCHIATRIC: No history of depression or anxiety.  ENDOCRINOLOGIC: No reports of sweating, cold or heat intolerance. No polyuria or polydipsia.  Marland Kitchen   Physical Examination Vitals:   02/25/17 0956  BP: 111/67  Pulse: 71   Vitals:   02/25/17 0956  Weight: 163 lb 12.8 oz (74.3 kg)  Height: 5' (1.524 m)    Gen: resting comfortably, no acute distress HEENT: no scleral icterus, pupils equal round and reactive, no palptable cervical adenopathy,  CV: RRR, no m/r/g, no jvd Resp: Clear to auscultation bilaterally GI: abdomen is soft, non-tender, non-distended, normal bowel sounds, no hepatosplenomegaly MSK: extremities are warm, no edema.  Skin: warm, no rash Neuro:  no focal deficits Psych: appropriate affect   Diagnostic Studies 09/2014 echo Study Conclusions  - Left ventricle: The cavity size was normal. There was severe asymmetric hypertrophy of the septum. Systolic function was normal. The estimated ejection fraction was in the range of 60% to 65%. Wall motion was normal; there were no regional wall motion abnormalities. - Aortic valve: There was mild regurgitation. - Mitral valve: There was mild regurgitation directed centrally. - Left atrium: The atrium was moderately to severely dilated. - Tricuspid valve: There was moderate regurgitation. - Pulmonary arteries: Systolic pressure was mildly increased. PA peak pressure: 41 mm Hg (S).  Impressions:  - Appearance is consistent with hypertrophic cardiomyopathy with no outflow tract obstruction (at rest).    Assessment and Plan  1. Chest pain - negative workup during previous admission, including MPI with no ischemia - no recent symptoms, conitnue to monitor.   2. Afib - no symptoms, continue current meds including anticoag  3. Permanent pacemaker - no symptoms, recent device check with normal  function - continue to monitor.   4. Hypertrophic CM - no current symptoms, no outflow gradient on echo - no SCD risk factors - we will continue to monitor  5. HTN - bp is at goal, continue current meds     [name], M.D.

## 2017-02-25 NOTE — Patient Instructions (Signed)

## 2017-03-28 DIAGNOSIS — H40053 Ocular hypertension, bilateral: Secondary | ICD-10-CM | POA: Diagnosis not present

## 2017-03-28 DIAGNOSIS — H2513 Age-related nuclear cataract, bilateral: Secondary | ICD-10-CM | POA: Diagnosis not present

## 2017-04-11 NOTE — Patient Instructions (Signed)
Your procedure is scheduled on: 04/18/2017   Report to [facility] at  1100   AM.  Call this number if you have problems the morning of surgery: [phone]   Do not eat food or drink liquids :After Midnight.      Take these medicines the morning of surgery with A SIP OF WATER: alloopurinol, lisinopril, toprol XL.   Do not wear jewelry, make-up or nail polish.  Do not wear lotions, powders, or perfumes. You may wear deodorant.  Do not shave 48 hours prior to surgery.  Do not bring valuables to the hospital.  Contacts, dentures or bridgework may not be worn into surgery.  Leave suitcase in the car. After surgery it may be brought to your room.  For patients admitted to the hospital, checkout time is 11:00 AM the day of discharge.   Patients discharged the day of surgery will not be allowed to drive home.  :     Please read over the following fact sheets that you were given: Coughing and Deep Breathing, Surgical Site Infection Prevention, Anesthesia Post-op Instructions and Care and Recovery After Surgery    Cataract A cataract is a clouding of the lens of the eye. When a lens becomes cloudy, vision is reduced based on the degree and nature of the clouding. Many cataracts reduce vision to some degree. Some cataracts make people more near-sighted as they develop. Other cataracts increase glare. Cataracts that are ignored and become worse can sometimes look white. The white color can be seen through the pupil. CAUSES   Aging. However, cataracts may occur at any age, even in newborns.   Certain drugs.   Trauma to the eye.   Certain diseases such as diabetes.   Specific eye diseases such as chronic inflammation inside the eye or a sudden attack of a rare form of glaucoma.   Inherited or acquired medical problems.  SYMPTOMS   Gradual, progressive drop in vision in the affected eye.   Severe, rapid visual loss. This most often happens when trauma is the cause.  DIAGNOSIS  To detect a  cataract, an eye doctor examines the lens. Cataracts are best diagnosed with an exam of the eyes with the pupils enlarged (dilated) by drops.  TREATMENT  For an early cataract, vision may improve by using different eyeglasses or stronger lighting. If that does not help your vision, surgery is the only effective treatment. A cataract needs to be surgically removed when vision loss interferes with your everyday activities, such as driving, reading, or watching TV. A cataract may also have to be removed if it prevents examination or treatment of another eye problem. Surgery removes the cloudy lens and usually replaces it with a substitute lens (intraocular lens, IOL).  At a time when both you and your doctor agree, the cataract will be surgically removed. If you have cataracts in both eyes, only one is usually removed at a time. This allows the operated eye to heal and be out of danger from any possible problems after surgery (such as infection or poor wound healing). In rare cases, a cataract may be doing damage to your eye. In these cases, your caregiver may advise surgical removal right away. The vast majority of people who have cataract surgery have better vision afterward. HOME CARE INSTRUCTIONS  If you are not planning surgery, you may be asked to do the following:  Use different eyeglasses.   Use stronger or brighter lighting.   Ask your eye doctor about  reducing your medicine dose or changing medicines if it is thought that a medicine caused your cataract. Changing medicines does not make the cataract go away on its own.   Become familiar with your surroundings. Poor vision can lead to injury. Avoid bumping into things on the affected side. You are at a higher risk for tripping or falling.   Exercise extreme care when driving or operating machinery.   Wear sunglasses if you are sensitive to bright light or experiencing problems with glare.  SEEK IMMEDIATE MEDICAL CARE IF:   You have a  worsening or sudden vision loss.   You notice redness, swelling, or increasing pain in the eye.   You have a fever.  Document Released: 04/26/2005 Document Revised: 04/15/2011 Document Reviewed: 12/18/2010 [facility] Patient Information 2012 [place].PATIENT INSTRUCTIONS POST-ANESTHESIA  IMMEDIATELY FOLLOWING SURGERY:  Do not drive or operate machinery for the first twenty four hours after surgery.  Do not make any important decisions for twenty four hours after surgery or while taking narcotic pain medications or sedatives.  If you develop intractable nausea and vomiting or a severe headache please notify your doctor immediately.  FOLLOW-UP:  Please make an appointment with your surgeon as instructed. You do not need to follow up with anesthesia unless specifically instructed to do so.  WOUND CARE INSTRUCTIONS (if applicable):  Keep a dry clean dressing on the anesthesia/puncture wound site if there is drainage.  Once the wound has quit draining you may leave it open to air.  Generally you should leave the bandage intact for twenty four hours unless there is drainage.  If the epidural site drains for more than 36-48 hours please call the anesthesia department.  QUESTIONS?:  Please feel free to call your physician or the hospital operator if you have any questions, and they will be happy to assist you.

## 2017-04-12 DIAGNOSIS — I4891 Unspecified atrial fibrillation: Secondary | ICD-10-CM | POA: Diagnosis not present

## 2017-04-12 DIAGNOSIS — Z6828 Body mass index (BMI) 28.0-28.9, adult: Secondary | ICD-10-CM | POA: Diagnosis not present

## 2017-04-12 DIAGNOSIS — Z87891 Personal history of nicotine dependence: Secondary | ICD-10-CM | POA: Diagnosis not present

## 2017-04-12 DIAGNOSIS — H269 Unspecified cataract: Secondary | ICD-10-CM | POA: Diagnosis not present

## 2017-04-12 DIAGNOSIS — E1165 Type 2 diabetes mellitus with hyperglycemia: Secondary | ICD-10-CM | POA: Diagnosis not present

## 2017-04-12 DIAGNOSIS — Z299 Encounter for prophylactic measures, unspecified: Secondary | ICD-10-CM | POA: Diagnosis not present

## 2017-04-12 DIAGNOSIS — R55 Syncope and collapse: Secondary | ICD-10-CM | POA: Diagnosis not present

## 2017-04-13 ENCOUNTER — Encounter (HOSPITAL_COMMUNITY)
Admission: RE | Admit: 2017-04-13 | Discharge: 2017-04-13 | Disposition: A | Discharge status: Home / Self Care | Payer: Medicare Other | Source: Ambulatory Visit | Attending: Ophthalmology | Admitting: Ophthalmology

## 2017-04-13 ENCOUNTER — Other Ambulatory Visit: Payer: Self-pay

## 2017-04-13 ENCOUNTER — Encounter (HOSPITAL_COMMUNITY): Payer: Self-pay

## 2017-04-13 DIAGNOSIS — Z01812 Encounter for preprocedural laboratory examination: Secondary | ICD-10-CM | POA: Insufficient documentation

## 2017-04-13 DIAGNOSIS — H2513 Age-related nuclear cataract, bilateral: Secondary | ICD-10-CM | POA: Insufficient documentation

## 2017-04-13 HISTORY — DX: Personal history of other diseases of the musculoskeletal system and connective tissue: Z87.39

## 2017-04-13 HISTORY — DX: Unspecified osteoarthritis, unspecified site: M19.90

## 2017-04-13 LAB — HEMOGLOBIN A1C
HEMOGLOBIN A1C: 6 % — AB (ref 4.8–5.6)
MEAN PLASMA GLUCOSE: 125.5 mg/dL

## 2017-04-13 LAB — CBC WITH DIFFERENTIAL/PLATELET
BASOS ABS: 0 10*3/uL (ref 0.0–0.1)
Basophils Relative: 1 %
EOS ABS: 0.1 10*3/uL (ref 0.0–0.7)
EOS PCT: 4 %
HCT: 35.7 % — ABNORMAL LOW (ref 36.0–46.0)
HEMOGLOBIN: 11 g/dL — AB (ref 12.0–15.0)
LYMPHS PCT: 45 %
Lymphs Abs: 1.4 10*3/uL (ref 0.7–4.0)
MCH: 28.4 pg (ref 26.0–34.0)
MCHC: 30.8 g/dL (ref 30.0–36.0)
MCV: 92.2 fL (ref 78.0–100.0)
Monocytes Absolute: 0.2 10*3/uL (ref 0.1–1.0)
Monocytes Relative: 6 %
NEUTROS PCT: 44 %
Neutro Abs: 1.4 10*3/uL — ABNORMAL LOW (ref 1.7–7.7)
PLATELETS: 184 10*3/uL (ref 150–400)
RBC: 3.87 MIL/uL (ref 3.87–5.11)
RDW: 14 % (ref 11.5–15.5)
WBC: 3.1 10*3/uL — AB (ref 4.0–10.5)

## 2017-04-13 LAB — BASIC METABOLIC PANEL
ANION GAP: 8 (ref 5–15)
BUN: 45 mg/dL — ABNORMAL HIGH (ref 6–20)
CHLORIDE: 103 mmol/L (ref 101–111)
CO2: 28 mmol/L (ref 22–32)
Calcium: 9.9 mg/dL (ref 8.9–10.3)
Creatinine, Ser: 1.28 mg/dL — ABNORMAL HIGH (ref 0.44–1.00)
GFR calc Af Amer: 44 mL/min — ABNORMAL LOW (ref >60)
GFR, EST NON AFRICAN AMERICAN: 38 mL/min — AB (ref >60)
Glucose, Bld: 118 mg/dL — ABNORMAL HIGH (ref 65–99)
POTASSIUM: 3.5 mmol/L (ref 3.5–5.1)
SODIUM: 139 mmol/L (ref 135–145)

## 2017-04-13 LAB — GLUCOSE, CAPILLARY: GLUCOSE-CAPILLARY: 113 mg/dL — AB (ref 65–99)

## 2017-04-13 NOTE — Pre-Procedure Instructions (Signed)
hgbA1C routed to PCP.

## 2017-04-27 ENCOUNTER — Other Ambulatory Visit: Payer: Self-pay | Admitting: Internal Medicine

## 2017-05-16 ENCOUNTER — Encounter (HOSPITAL_COMMUNITY)
Admission: RE | Admit: 2017-05-16 | Discharge: 2017-05-16 | Disposition: A | Discharge status: Home / Self Care | Payer: Medicare Other | Source: Ambulatory Visit | Attending: Ophthalmology | Admitting: Ophthalmology

## 2017-05-16 ENCOUNTER — Encounter (HOSPITAL_COMMUNITY): Payer: Self-pay

## 2017-05-23 ENCOUNTER — Encounter (HOSPITAL_COMMUNITY): Admission: RE | Payer: Self-pay | Source: Ambulatory Visit

## 2017-05-23 ENCOUNTER — Ambulatory Visit (HOSPITAL_COMMUNITY): Admission: RE | Admit: 2017-05-23 | Payer: Medicare Other | Source: Ambulatory Visit | Admitting: Ophthalmology

## 2017-05-23 SURGERY — PHACOEMULSIFICATION, CATARACT, WITH IOL INSERTION
Anesthesia: Monitor Anesthesia Care | Site: Eye | Laterality: Left

## 2017-05-30 DIAGNOSIS — J069 Acute upper respiratory infection, unspecified: Secondary | ICD-10-CM | POA: Diagnosis not present

## 2017-05-30 DIAGNOSIS — I4891 Unspecified atrial fibrillation: Secondary | ICD-10-CM | POA: Diagnosis not present

## 2017-05-30 DIAGNOSIS — Z87891 Personal history of nicotine dependence: Secondary | ICD-10-CM | POA: Diagnosis not present

## 2017-05-30 DIAGNOSIS — I10 Essential (primary) hypertension: Secondary | ICD-10-CM | POA: Diagnosis not present

## 2017-05-30 DIAGNOSIS — Z299 Encounter for prophylactic measures, unspecified: Secondary | ICD-10-CM | POA: Diagnosis not present

## 2017-05-30 DIAGNOSIS — E7800 Pure hypercholesterolemia, unspecified: Secondary | ICD-10-CM | POA: Diagnosis not present

## 2017-05-30 DIAGNOSIS — E1165 Type 2 diabetes mellitus with hyperglycemia: Secondary | ICD-10-CM | POA: Diagnosis not present

## 2017-05-30 DIAGNOSIS — Z6824 Body mass index (BMI) 24.0-24.9, adult: Secondary | ICD-10-CM | POA: Diagnosis not present

## 2017-05-31 ENCOUNTER — Other Ambulatory Visit: Payer: Self-pay | Admitting: Internal Medicine

## 2017-06-14 DIAGNOSIS — Z299 Encounter for prophylactic measures, unspecified: Secondary | ICD-10-CM | POA: Diagnosis not present

## 2017-06-14 DIAGNOSIS — I10 Essential (primary) hypertension: Secondary | ICD-10-CM | POA: Diagnosis present

## 2017-06-14 DIAGNOSIS — D649 Anemia, unspecified: Secondary | ICD-10-CM | POA: Diagnosis present

## 2017-06-14 DIAGNOSIS — Z79899 Other long term (current) drug therapy: Secondary | ICD-10-CM | POA: Diagnosis not present

## 2017-06-14 DIAGNOSIS — E860 Dehydration: Secondary | ICD-10-CM | POA: Diagnosis present

## 2017-06-14 DIAGNOSIS — I4891 Unspecified atrial fibrillation: Secondary | ICD-10-CM | POA: Diagnosis present

## 2017-06-14 DIAGNOSIS — N179 Acute kidney failure, unspecified: Secondary | ICD-10-CM | POA: Diagnosis present

## 2017-06-14 DIAGNOSIS — R0602 Shortness of breath: Secondary | ICD-10-CM | POA: Diagnosis not present

## 2017-06-14 DIAGNOSIS — I959 Hypotension, unspecified: Secondary | ICD-10-CM | POA: Diagnosis present

## 2017-06-14 DIAGNOSIS — B952 Enterococcus as the cause of diseases classified elsewhere: Secondary | ICD-10-CM | POA: Diagnosis present

## 2017-06-14 DIAGNOSIS — K2970 Gastritis, unspecified, without bleeding: Secondary | ICD-10-CM | POA: Diagnosis present

## 2017-06-14 DIAGNOSIS — Z7901 Long term (current) use of anticoagulants: Secondary | ICD-10-CM | POA: Diagnosis not present

## 2017-06-14 DIAGNOSIS — Z950 Presence of cardiac pacemaker: Secondary | ICD-10-CM | POA: Diagnosis not present

## 2017-06-14 DIAGNOSIS — Z6824 Body mass index (BMI) 24.0-24.9, adult: Secondary | ICD-10-CM | POA: Diagnosis not present

## 2017-06-14 DIAGNOSIS — R55 Syncope and collapse: Secondary | ICD-10-CM | POA: Diagnosis present

## 2017-06-17 DIAGNOSIS — M6281 Muscle weakness (generalized): Secondary | ICD-10-CM | POA: Diagnosis not present

## 2017-06-17 DIAGNOSIS — I959 Hypotension, unspecified: Secondary | ICD-10-CM | POA: Diagnosis not present

## 2017-06-17 DIAGNOSIS — R262 Difficulty in walking, not elsewhere classified: Secondary | ICD-10-CM | POA: Diagnosis not present

## 2017-06-17 DIAGNOSIS — Z9181 History of falling: Secondary | ICD-10-CM | POA: Diagnosis not present

## 2017-06-17 DIAGNOSIS — Z7901 Long term (current) use of anticoagulants: Secondary | ICD-10-CM | POA: Diagnosis not present

## 2017-06-17 DIAGNOSIS — R55 Syncope and collapse: Secondary | ICD-10-CM | POA: Diagnosis not present

## 2017-06-17 DIAGNOSIS — M150 Primary generalized (osteo)arthritis: Secondary | ICD-10-CM | POA: Diagnosis not present

## 2017-06-17 DIAGNOSIS — I4891 Unspecified atrial fibrillation: Secondary | ICD-10-CM | POA: Diagnosis not present

## 2017-06-17 DIAGNOSIS — Z950 Presence of cardiac pacemaker: Secondary | ICD-10-CM | POA: Diagnosis not present

## 2017-06-17 DIAGNOSIS — E119 Type 2 diabetes mellitus without complications: Secondary | ICD-10-CM | POA: Diagnosis not present

## 2017-06-20 DIAGNOSIS — I4891 Unspecified atrial fibrillation: Secondary | ICD-10-CM | POA: Diagnosis not present

## 2017-06-20 DIAGNOSIS — E119 Type 2 diabetes mellitus without complications: Secondary | ICD-10-CM | POA: Diagnosis not present

## 2017-06-20 DIAGNOSIS — M150 Primary generalized (osteo)arthritis: Secondary | ICD-10-CM | POA: Diagnosis not present

## 2017-06-20 DIAGNOSIS — I959 Hypotension, unspecified: Secondary | ICD-10-CM | POA: Diagnosis not present

## 2017-06-20 DIAGNOSIS — R55 Syncope and collapse: Secondary | ICD-10-CM | POA: Diagnosis not present

## 2017-06-20 DIAGNOSIS — R262 Difficulty in walking, not elsewhere classified: Secondary | ICD-10-CM | POA: Diagnosis not present

## 2017-06-21 DIAGNOSIS — E119 Type 2 diabetes mellitus without complications: Secondary | ICD-10-CM | POA: Diagnosis not present

## 2017-06-21 DIAGNOSIS — R55 Syncope and collapse: Secondary | ICD-10-CM | POA: Diagnosis not present

## 2017-06-21 DIAGNOSIS — R262 Difficulty in walking, not elsewhere classified: Secondary | ICD-10-CM | POA: Diagnosis not present

## 2017-06-21 DIAGNOSIS — I959 Hypotension, unspecified: Secondary | ICD-10-CM | POA: Diagnosis not present

## 2017-06-21 DIAGNOSIS — M150 Primary generalized (osteo)arthritis: Secondary | ICD-10-CM | POA: Diagnosis not present

## 2017-06-21 DIAGNOSIS — I4891 Unspecified atrial fibrillation: Secondary | ICD-10-CM | POA: Diagnosis not present

## 2017-06-23 DIAGNOSIS — I10 Essential (primary) hypertension: Secondary | ICD-10-CM | POA: Diagnosis not present

## 2017-06-23 DIAGNOSIS — Z6826 Body mass index (BMI) 26.0-26.9, adult: Secondary | ICD-10-CM | POA: Diagnosis not present

## 2017-06-23 DIAGNOSIS — I4891 Unspecified atrial fibrillation: Secondary | ICD-10-CM | POA: Diagnosis not present

## 2017-06-23 DIAGNOSIS — E7800 Pure hypercholesterolemia, unspecified: Secondary | ICD-10-CM | POA: Diagnosis not present

## 2017-06-23 DIAGNOSIS — N179 Acute kidney failure, unspecified: Secondary | ICD-10-CM | POA: Diagnosis not present

## 2017-06-23 DIAGNOSIS — E1165 Type 2 diabetes mellitus with hyperglycemia: Secondary | ICD-10-CM | POA: Diagnosis not present

## 2017-06-23 DIAGNOSIS — Z299 Encounter for prophylactic measures, unspecified: Secondary | ICD-10-CM | POA: Diagnosis not present

## 2017-06-23 DIAGNOSIS — Z09 Encounter for follow-up examination after completed treatment for conditions other than malignant neoplasm: Secondary | ICD-10-CM | POA: Diagnosis not present

## 2017-06-24 DIAGNOSIS — R262 Difficulty in walking, not elsewhere classified: Secondary | ICD-10-CM | POA: Diagnosis not present

## 2017-06-24 DIAGNOSIS — E119 Type 2 diabetes mellitus without complications: Secondary | ICD-10-CM | POA: Diagnosis not present

## 2017-06-24 DIAGNOSIS — I959 Hypotension, unspecified: Secondary | ICD-10-CM | POA: Diagnosis not present

## 2017-06-24 DIAGNOSIS — R55 Syncope and collapse: Secondary | ICD-10-CM | POA: Diagnosis not present

## 2017-06-24 DIAGNOSIS — M150 Primary generalized (osteo)arthritis: Secondary | ICD-10-CM | POA: Diagnosis not present

## 2017-06-24 DIAGNOSIS — I4891 Unspecified atrial fibrillation: Secondary | ICD-10-CM | POA: Diagnosis not present

## 2017-06-27 DIAGNOSIS — I959 Hypotension, unspecified: Secondary | ICD-10-CM | POA: Diagnosis not present

## 2017-06-27 DIAGNOSIS — R262 Difficulty in walking, not elsewhere classified: Secondary | ICD-10-CM | POA: Diagnosis not present

## 2017-06-27 DIAGNOSIS — M150 Primary generalized (osteo)arthritis: Secondary | ICD-10-CM | POA: Diagnosis not present

## 2017-06-27 DIAGNOSIS — R55 Syncope and collapse: Secondary | ICD-10-CM | POA: Diagnosis not present

## 2017-06-27 DIAGNOSIS — E119 Type 2 diabetes mellitus without complications: Secondary | ICD-10-CM | POA: Diagnosis not present

## 2017-06-27 DIAGNOSIS — I4891 Unspecified atrial fibrillation: Secondary | ICD-10-CM | POA: Diagnosis not present

## 2017-06-29 ENCOUNTER — Other Ambulatory Visit: Payer: Self-pay | Admitting: Cardiovascular Disease

## 2017-06-29 DIAGNOSIS — R262 Difficulty in walking, not elsewhere classified: Secondary | ICD-10-CM | POA: Diagnosis not present

## 2017-06-29 DIAGNOSIS — M150 Primary generalized (osteo)arthritis: Secondary | ICD-10-CM | POA: Diagnosis not present

## 2017-06-29 DIAGNOSIS — E119 Type 2 diabetes mellitus without complications: Secondary | ICD-10-CM | POA: Diagnosis not present

## 2017-06-29 DIAGNOSIS — I959 Hypotension, unspecified: Secondary | ICD-10-CM | POA: Diagnosis not present

## 2017-06-29 DIAGNOSIS — R55 Syncope and collapse: Secondary | ICD-10-CM | POA: Diagnosis not present

## 2017-06-29 DIAGNOSIS — I4891 Unspecified atrial fibrillation: Secondary | ICD-10-CM | POA: Diagnosis not present

## 2017-06-30 DIAGNOSIS — I4891 Unspecified atrial fibrillation: Secondary | ICD-10-CM | POA: Diagnosis not present

## 2017-06-30 DIAGNOSIS — R262 Difficulty in walking, not elsewhere classified: Secondary | ICD-10-CM | POA: Diagnosis not present

## 2017-06-30 DIAGNOSIS — I959 Hypotension, unspecified: Secondary | ICD-10-CM | POA: Diagnosis not present

## 2017-06-30 DIAGNOSIS — M150 Primary generalized (osteo)arthritis: Secondary | ICD-10-CM | POA: Diagnosis not present

## 2017-06-30 DIAGNOSIS — E119 Type 2 diabetes mellitus without complications: Secondary | ICD-10-CM | POA: Diagnosis not present

## 2017-06-30 DIAGNOSIS — R55 Syncope and collapse: Secondary | ICD-10-CM | POA: Diagnosis not present

## 2017-07-01 DIAGNOSIS — M150 Primary generalized (osteo)arthritis: Secondary | ICD-10-CM | POA: Diagnosis not present

## 2017-07-01 DIAGNOSIS — R55 Syncope and collapse: Secondary | ICD-10-CM | POA: Diagnosis not present

## 2017-07-01 DIAGNOSIS — R262 Difficulty in walking, not elsewhere classified: Secondary | ICD-10-CM | POA: Diagnosis not present

## 2017-07-01 DIAGNOSIS — E119 Type 2 diabetes mellitus without complications: Secondary | ICD-10-CM | POA: Diagnosis not present

## 2017-07-01 DIAGNOSIS — I4891 Unspecified atrial fibrillation: Secondary | ICD-10-CM | POA: Diagnosis not present

## 2017-07-01 DIAGNOSIS — I959 Hypotension, unspecified: Secondary | ICD-10-CM | POA: Diagnosis not present

## 2017-07-04 DIAGNOSIS — E119 Type 2 diabetes mellitus without complications: Secondary | ICD-10-CM | POA: Diagnosis not present

## 2017-07-04 DIAGNOSIS — R55 Syncope and collapse: Secondary | ICD-10-CM | POA: Diagnosis not present

## 2017-07-04 DIAGNOSIS — I4891 Unspecified atrial fibrillation: Secondary | ICD-10-CM | POA: Diagnosis not present

## 2017-07-04 DIAGNOSIS — I959 Hypotension, unspecified: Secondary | ICD-10-CM | POA: Diagnosis not present

## 2017-07-04 DIAGNOSIS — R262 Difficulty in walking, not elsewhere classified: Secondary | ICD-10-CM | POA: Diagnosis not present

## 2017-07-04 DIAGNOSIS — M150 Primary generalized (osteo)arthritis: Secondary | ICD-10-CM | POA: Diagnosis not present

## 2017-07-06 DIAGNOSIS — I959 Hypotension, unspecified: Secondary | ICD-10-CM | POA: Diagnosis not present

## 2017-07-06 DIAGNOSIS — E119 Type 2 diabetes mellitus without complications: Secondary | ICD-10-CM | POA: Diagnosis not present

## 2017-07-06 DIAGNOSIS — R55 Syncope and collapse: Secondary | ICD-10-CM | POA: Diagnosis not present

## 2017-07-06 DIAGNOSIS — M150 Primary generalized (osteo)arthritis: Secondary | ICD-10-CM | POA: Diagnosis not present

## 2017-07-06 DIAGNOSIS — I4891 Unspecified atrial fibrillation: Secondary | ICD-10-CM | POA: Diagnosis not present

## 2017-07-06 DIAGNOSIS — R262 Difficulty in walking, not elsewhere classified: Secondary | ICD-10-CM | POA: Diagnosis not present

## 2017-07-07 DIAGNOSIS — R262 Difficulty in walking, not elsewhere classified: Secondary | ICD-10-CM | POA: Diagnosis not present

## 2017-07-07 DIAGNOSIS — Z87891 Personal history of nicotine dependence: Secondary | ICD-10-CM | POA: Diagnosis not present

## 2017-07-07 DIAGNOSIS — M150 Primary generalized (osteo)arthritis: Secondary | ICD-10-CM | POA: Diagnosis not present

## 2017-07-07 DIAGNOSIS — E7800 Pure hypercholesterolemia, unspecified: Secondary | ICD-10-CM | POA: Diagnosis not present

## 2017-07-07 DIAGNOSIS — Z299 Encounter for prophylactic measures, unspecified: Secondary | ICD-10-CM | POA: Diagnosis not present

## 2017-07-07 DIAGNOSIS — R600 Localized edema: Secondary | ICD-10-CM | POA: Diagnosis not present

## 2017-07-07 DIAGNOSIS — Z6825 Body mass index (BMI) 25.0-25.9, adult: Secondary | ICD-10-CM | POA: Diagnosis not present

## 2017-07-07 DIAGNOSIS — R55 Syncope and collapse: Secondary | ICD-10-CM | POA: Diagnosis not present

## 2017-07-07 DIAGNOSIS — I10 Essential (primary) hypertension: Secondary | ICD-10-CM | POA: Diagnosis not present

## 2017-07-07 DIAGNOSIS — R05 Cough: Secondary | ICD-10-CM | POA: Diagnosis not present

## 2017-07-07 DIAGNOSIS — E1165 Type 2 diabetes mellitus with hyperglycemia: Secondary | ICD-10-CM | POA: Diagnosis not present

## 2017-07-07 DIAGNOSIS — J069 Acute upper respiratory infection, unspecified: Secondary | ICD-10-CM | POA: Diagnosis not present

## 2017-07-07 DIAGNOSIS — I4891 Unspecified atrial fibrillation: Secondary | ICD-10-CM | POA: Diagnosis not present

## 2017-07-07 DIAGNOSIS — E119 Type 2 diabetes mellitus without complications: Secondary | ICD-10-CM | POA: Diagnosis not present

## 2017-07-07 DIAGNOSIS — M1990 Unspecified osteoarthritis, unspecified site: Secondary | ICD-10-CM | POA: Diagnosis not present

## 2017-07-07 DIAGNOSIS — I959 Hypotension, unspecified: Secondary | ICD-10-CM | POA: Diagnosis not present

## 2017-07-08 DIAGNOSIS — I110 Hypertensive heart disease with heart failure: Secondary | ICD-10-CM | POA: Diagnosis not present

## 2017-07-08 DIAGNOSIS — I4891 Unspecified atrial fibrillation: Secondary | ICD-10-CM | POA: Diagnosis not present

## 2017-07-08 DIAGNOSIS — E119 Type 2 diabetes mellitus without complications: Secondary | ICD-10-CM | POA: Diagnosis not present

## 2017-07-08 DIAGNOSIS — I509 Heart failure, unspecified: Secondary | ICD-10-CM | POA: Diagnosis not present

## 2017-07-08 DIAGNOSIS — M109 Gout, unspecified: Secondary | ICD-10-CM | POA: Diagnosis not present

## 2017-07-08 DIAGNOSIS — Z950 Presence of cardiac pacemaker: Secondary | ICD-10-CM | POA: Diagnosis not present

## 2017-07-08 DIAGNOSIS — R531 Weakness: Secondary | ICD-10-CM | POA: Diagnosis not present

## 2017-07-08 DIAGNOSIS — I5033 Acute on chronic diastolic (congestive) heart failure: Secondary | ICD-10-CM | POA: Diagnosis not present

## 2017-07-08 DIAGNOSIS — R0602 Shortness of breath: Secondary | ICD-10-CM | POA: Diagnosis not present

## 2017-07-08 DIAGNOSIS — R404 Transient alteration of awareness: Secondary | ICD-10-CM | POA: Diagnosis not present

## 2017-07-09 DIAGNOSIS — Z950 Presence of cardiac pacemaker: Secondary | ICD-10-CM | POA: Diagnosis not present

## 2017-07-09 DIAGNOSIS — E119 Type 2 diabetes mellitus without complications: Secondary | ICD-10-CM | POA: Diagnosis present

## 2017-07-09 DIAGNOSIS — I4891 Unspecified atrial fibrillation: Secondary | ICD-10-CM | POA: Diagnosis present

## 2017-07-09 DIAGNOSIS — K219 Gastro-esophageal reflux disease without esophagitis: Secondary | ICD-10-CM | POA: Diagnosis present

## 2017-07-09 DIAGNOSIS — M109 Gout, unspecified: Secondary | ICD-10-CM | POA: Diagnosis present

## 2017-07-09 DIAGNOSIS — Z87891 Personal history of nicotine dependence: Secondary | ICD-10-CM | POA: Diagnosis not present

## 2017-07-09 DIAGNOSIS — I110 Hypertensive heart disease with heart failure: Secondary | ICD-10-CM | POA: Diagnosis present

## 2017-07-09 DIAGNOSIS — E7800 Pure hypercholesterolemia, unspecified: Secondary | ICD-10-CM | POA: Diagnosis present

## 2017-07-09 DIAGNOSIS — Z79899 Other long term (current) drug therapy: Secondary | ICD-10-CM | POA: Diagnosis not present

## 2017-07-09 DIAGNOSIS — Z7901 Long term (current) use of anticoagulants: Secondary | ICD-10-CM | POA: Diagnosis not present

## 2017-07-09 DIAGNOSIS — I5033 Acute on chronic diastolic (congestive) heart failure: Secondary | ICD-10-CM | POA: Diagnosis present

## 2017-07-13 DIAGNOSIS — E119 Type 2 diabetes mellitus without complications: Secondary | ICD-10-CM | POA: Diagnosis not present

## 2017-07-13 DIAGNOSIS — M150 Primary generalized (osteo)arthritis: Secondary | ICD-10-CM | POA: Diagnosis not present

## 2017-07-13 DIAGNOSIS — R55 Syncope and collapse: Secondary | ICD-10-CM | POA: Diagnosis not present

## 2017-07-13 DIAGNOSIS — I959 Hypotension, unspecified: Secondary | ICD-10-CM | POA: Diagnosis not present

## 2017-07-13 DIAGNOSIS — R262 Difficulty in walking, not elsewhere classified: Secondary | ICD-10-CM | POA: Diagnosis not present

## 2017-07-13 DIAGNOSIS — I4891 Unspecified atrial fibrillation: Secondary | ICD-10-CM | POA: Diagnosis not present

## 2017-07-14 DIAGNOSIS — M150 Primary generalized (osteo)arthritis: Secondary | ICD-10-CM | POA: Diagnosis not present

## 2017-07-14 DIAGNOSIS — I4891 Unspecified atrial fibrillation: Secondary | ICD-10-CM | POA: Diagnosis not present

## 2017-07-14 DIAGNOSIS — I959 Hypotension, unspecified: Secondary | ICD-10-CM | POA: Diagnosis not present

## 2017-07-14 DIAGNOSIS — E119 Type 2 diabetes mellitus without complications: Secondary | ICD-10-CM | POA: Diagnosis not present

## 2017-07-14 DIAGNOSIS — R55 Syncope and collapse: Secondary | ICD-10-CM | POA: Diagnosis not present

## 2017-07-14 DIAGNOSIS — R262 Difficulty in walking, not elsewhere classified: Secondary | ICD-10-CM | POA: Diagnosis not present

## 2017-07-15 DIAGNOSIS — I4891 Unspecified atrial fibrillation: Secondary | ICD-10-CM | POA: Diagnosis not present

## 2017-07-15 DIAGNOSIS — I959 Hypotension, unspecified: Secondary | ICD-10-CM | POA: Diagnosis not present

## 2017-07-15 DIAGNOSIS — M150 Primary generalized (osteo)arthritis: Secondary | ICD-10-CM | POA: Diagnosis not present

## 2017-07-15 DIAGNOSIS — E119 Type 2 diabetes mellitus without complications: Secondary | ICD-10-CM | POA: Diagnosis not present

## 2017-07-15 DIAGNOSIS — R262 Difficulty in walking, not elsewhere classified: Secondary | ICD-10-CM | POA: Diagnosis not present

## 2017-07-15 DIAGNOSIS — R55 Syncope and collapse: Secondary | ICD-10-CM | POA: Diagnosis not present

## 2017-07-19 DIAGNOSIS — R55 Syncope and collapse: Secondary | ICD-10-CM | POA: Diagnosis not present

## 2017-07-19 DIAGNOSIS — I959 Hypotension, unspecified: Secondary | ICD-10-CM | POA: Diagnosis not present

## 2017-07-19 DIAGNOSIS — M150 Primary generalized (osteo)arthritis: Secondary | ICD-10-CM | POA: Diagnosis not present

## 2017-07-19 DIAGNOSIS — I4891 Unspecified atrial fibrillation: Secondary | ICD-10-CM | POA: Diagnosis not present

## 2017-07-19 DIAGNOSIS — R262 Difficulty in walking, not elsewhere classified: Secondary | ICD-10-CM | POA: Diagnosis not present

## 2017-07-19 DIAGNOSIS — E119 Type 2 diabetes mellitus without complications: Secondary | ICD-10-CM | POA: Diagnosis not present

## 2017-07-20 ENCOUNTER — Encounter: Payer: Self-pay | Admitting: *Deleted

## 2017-07-20 ENCOUNTER — Telehealth: Payer: Self-pay | Admitting: Cardiology

## 2017-07-20 DIAGNOSIS — R262 Difficulty in walking, not elsewhere classified: Secondary | ICD-10-CM | POA: Diagnosis not present

## 2017-07-20 DIAGNOSIS — Z0000 Encounter for general adult medical examination without abnormal findings: Secondary | ICD-10-CM | POA: Diagnosis not present

## 2017-07-20 DIAGNOSIS — E1165 Type 2 diabetes mellitus with hyperglycemia: Secondary | ICD-10-CM | POA: Diagnosis not present

## 2017-07-20 DIAGNOSIS — I959 Hypotension, unspecified: Secondary | ICD-10-CM | POA: Diagnosis not present

## 2017-07-20 DIAGNOSIS — Z6825 Body mass index (BMI) 25.0-25.9, adult: Secondary | ICD-10-CM | POA: Diagnosis not present

## 2017-07-20 DIAGNOSIS — I4891 Unspecified atrial fibrillation: Secondary | ICD-10-CM | POA: Diagnosis not present

## 2017-07-20 DIAGNOSIS — Z1211 Encounter for screening for malignant neoplasm of colon: Secondary | ICD-10-CM | POA: Diagnosis not present

## 2017-07-20 DIAGNOSIS — R5383 Other fatigue: Secondary | ICD-10-CM | POA: Diagnosis not present

## 2017-07-20 DIAGNOSIS — Z1339 Encounter for screening examination for other mental health and behavioral disorders: Secondary | ICD-10-CM | POA: Diagnosis not present

## 2017-07-20 DIAGNOSIS — M150 Primary generalized (osteo)arthritis: Secondary | ICD-10-CM | POA: Diagnosis not present

## 2017-07-20 DIAGNOSIS — E559 Vitamin D deficiency, unspecified: Secondary | ICD-10-CM | POA: Diagnosis not present

## 2017-07-20 DIAGNOSIS — Z79899 Other long term (current) drug therapy: Secondary | ICD-10-CM | POA: Diagnosis not present

## 2017-07-20 DIAGNOSIS — I10 Essential (primary) hypertension: Secondary | ICD-10-CM | POA: Diagnosis not present

## 2017-07-20 DIAGNOSIS — Z7189 Other specified counseling: Secondary | ICD-10-CM | POA: Diagnosis not present

## 2017-07-20 DIAGNOSIS — Z299 Encounter for prophylactic measures, unspecified: Secondary | ICD-10-CM | POA: Diagnosis not present

## 2017-07-20 DIAGNOSIS — R55 Syncope and collapse: Secondary | ICD-10-CM | POA: Diagnosis not present

## 2017-07-20 DIAGNOSIS — E119 Type 2 diabetes mellitus without complications: Secondary | ICD-10-CM | POA: Diagnosis not present

## 2017-07-20 DIAGNOSIS — Z1331 Encounter for screening for depression: Secondary | ICD-10-CM | POA: Diagnosis not present

## 2017-07-20 NOTE — Telephone Encounter (Signed)
Appointment scheduled for patient to come to the office with all medications for medications management of heart pills.

## 2017-07-20 NOTE — Telephone Encounter (Signed)
Patient was recently at patient at [facility].  States that the doctors at [facility] took her off of heart medications .  Please advise.

## 2017-07-20 NOTE — Telephone Encounter (Signed)
Records requested from [facility]

## 2017-07-21 DIAGNOSIS — E860 Dehydration: Secondary | ICD-10-CM | POA: Diagnosis present

## 2017-07-21 DIAGNOSIS — K2970 Gastritis, unspecified, without bleeding: Secondary | ICD-10-CM | POA: Diagnosis not present

## 2017-07-21 DIAGNOSIS — E119 Type 2 diabetes mellitus without complications: Secondary | ICD-10-CM | POA: Diagnosis present

## 2017-07-21 DIAGNOSIS — K219 Gastro-esophageal reflux disease without esophagitis: Secondary | ICD-10-CM | POA: Diagnosis present

## 2017-07-21 DIAGNOSIS — E7800 Pure hypercholesterolemia, unspecified: Secondary | ICD-10-CM | POA: Diagnosis present

## 2017-07-21 DIAGNOSIS — R110 Nausea: Secondary | ICD-10-CM | POA: Diagnosis not present

## 2017-07-21 DIAGNOSIS — Z950 Presence of cardiac pacemaker: Secondary | ICD-10-CM | POA: Diagnosis not present

## 2017-07-21 DIAGNOSIS — K529 Noninfective gastroenteritis and colitis, unspecified: Secondary | ICD-10-CM | POA: Diagnosis not present

## 2017-07-21 DIAGNOSIS — Z7901 Long term (current) use of anticoagulants: Secondary | ICD-10-CM | POA: Diagnosis not present

## 2017-07-21 DIAGNOSIS — R1084 Generalized abdominal pain: Secondary | ICD-10-CM | POA: Diagnosis not present

## 2017-07-21 DIAGNOSIS — I5032 Chronic diastolic (congestive) heart failure: Secondary | ICD-10-CM | POA: Diagnosis present

## 2017-07-21 DIAGNOSIS — I959 Hypotension, unspecified: Secondary | ICD-10-CM | POA: Diagnosis not present

## 2017-07-21 DIAGNOSIS — I2510 Atherosclerotic heart disease of native coronary artery without angina pectoris: Secondary | ICD-10-CM | POA: Diagnosis present

## 2017-07-21 DIAGNOSIS — K55031 Focal (segmental) acute (reversible) ischemia of large intestine: Secondary | ICD-10-CM | POA: Diagnosis present

## 2017-07-21 DIAGNOSIS — R531 Weakness: Secondary | ICD-10-CM | POA: Diagnosis not present

## 2017-07-21 DIAGNOSIS — R109 Unspecified abdominal pain: Secondary | ICD-10-CM | POA: Diagnosis not present

## 2017-07-21 DIAGNOSIS — Z79899 Other long term (current) drug therapy: Secondary | ICD-10-CM | POA: Diagnosis not present

## 2017-07-21 DIAGNOSIS — R55 Syncope and collapse: Secondary | ICD-10-CM | POA: Diagnosis not present

## 2017-07-21 DIAGNOSIS — I110 Hypertensive heart disease with heart failure: Secondary | ICD-10-CM | POA: Diagnosis not present

## 2017-07-21 DIAGNOSIS — I4891 Unspecified atrial fibrillation: Secondary | ICD-10-CM | POA: Diagnosis present

## 2017-07-21 DIAGNOSIS — R262 Difficulty in walking, not elsewhere classified: Secondary | ICD-10-CM | POA: Diagnosis not present

## 2017-07-21 DIAGNOSIS — M150 Primary generalized (osteo)arthritis: Secondary | ICD-10-CM | POA: Diagnosis not present

## 2017-07-21 DIAGNOSIS — K661 Hemoperitoneum: Secondary | ICD-10-CM | POA: Diagnosis not present

## 2017-07-21 DIAGNOSIS — Z87891 Personal history of nicotine dependence: Secondary | ICD-10-CM | POA: Diagnosis not present

## 2017-07-27 DIAGNOSIS — I959 Hypotension, unspecified: Secondary | ICD-10-CM | POA: Diagnosis not present

## 2017-07-27 DIAGNOSIS — M150 Primary generalized (osteo)arthritis: Secondary | ICD-10-CM | POA: Diagnosis not present

## 2017-07-27 DIAGNOSIS — R262 Difficulty in walking, not elsewhere classified: Secondary | ICD-10-CM | POA: Diagnosis not present

## 2017-07-27 DIAGNOSIS — R55 Syncope and collapse: Secondary | ICD-10-CM | POA: Diagnosis not present

## 2017-07-27 DIAGNOSIS — I4891 Unspecified atrial fibrillation: Secondary | ICD-10-CM | POA: Diagnosis not present

## 2017-07-27 DIAGNOSIS — E119 Type 2 diabetes mellitus without complications: Secondary | ICD-10-CM | POA: Diagnosis not present

## 2017-07-28 ENCOUNTER — Encounter: Payer: Self-pay | Admitting: Cardiology

## 2017-07-28 ENCOUNTER — Ambulatory Visit (INDEPENDENT_AMBULATORY_CARE_PROVIDER_SITE_OTHER): Payer: Medicare Other | Admitting: Cardiology

## 2017-07-28 ENCOUNTER — Encounter: Payer: Self-pay | Admitting: *Deleted

## 2017-07-28 ENCOUNTER — Other Ambulatory Visit: Payer: Self-pay

## 2017-07-28 VITALS — BP 130/78 | HR 88 | Ht 65.0 in | Wt 158.0 lb

## 2017-07-28 DIAGNOSIS — E2839 Other primary ovarian failure: Secondary | ICD-10-CM | POA: Diagnosis not present

## 2017-07-28 DIAGNOSIS — Z950 Presence of cardiac pacemaker: Secondary | ICD-10-CM | POA: Diagnosis not present

## 2017-07-28 DIAGNOSIS — R0789 Other chest pain: Secondary | ICD-10-CM | POA: Diagnosis not present

## 2017-07-28 DIAGNOSIS — I421 Obstructive hypertrophic cardiomyopathy: Secondary | ICD-10-CM

## 2017-07-28 DIAGNOSIS — E8940 Asymptomatic postprocedural ovarian failure: Secondary | ICD-10-CM | POA: Diagnosis not present

## 2017-07-28 DIAGNOSIS — I4891 Unspecified atrial fibrillation: Secondary | ICD-10-CM | POA: Diagnosis not present

## 2017-07-28 NOTE — Patient Instructions (Signed)
Your physician recommends that you schedule a follow-up appointment in: [place] has recommended you make the following change in your medication:   TAKE LASIX 40 MG IN THE MORNING AND 20 MG IN THE EVENING FOR THE NEXT 3 DAYS   CALL Monday WITH AN UPDATE ON YOUR WEIGHTS  Thank you for choosing [facility]!!

## 2017-07-28 NOTE — Progress Notes (Signed)
Clinical Summary Megan Walsh is a 84 y.o.female seen today for follow up of the following medical problems.    1. Chronic diastolic HF - admit to [facility] 07/2017 with fluid overload -07/2017 echo showed LVEF 55-60%. Diuresed with improved symptoms - readmitted later in 07/2017 with hypotension and colitis.    Discharge weight 147-149 07/11/17. No significant sob/doe   2. Syncope - episode in [place] when she had the flu. - no recurrent episodes.   3. History of chest pain - admitted 09/22/14 with chest pain. Notes describe atypical pain - negative workup for ACS, stress MPI showed small apical infarct, no current ischemia. - 09/3014 echo LVEF 60-65%, no WMAs, mild AI, mild MR, severe asymmetric hypertrophy of the septum reported 2.1 cm, no dynamic gradient.   - she denies any recent symptoms  4. Permanent pacemaker - pacemaker followed in device clinic - no recent symptoms.   4. Afib - CHAD2Vasc score of 7, high risk. She is on eliquis  - no recent symptoms.    5. Hypertrophic CM - echo with severe asymmetric septal hypertrophy in 2016, no gradient.  - no recent symptoms    6. HTN - compliant with meds, bp meds previously lowered due to issues with low bp's  7. Hyperlipidemia - conpliant with meds   8. Lymphedema/LE edema - has compression stockings at home - compliant with diuretics      Past Medical History:  Diagnosis Date  . Anxiety   . Arthritis   . CHF (congestive heart failure) ([place])   . Coronary artery disease   . Diabetes mellitus without complication ([place])   . Dysrhythmia    a-fib  . Fatty tumor   . History of gout   . Hypertension   . Myocardial infarction ([place])   . Permanent atrial fibrillation ([place])   . Presence of permanent cardiac pacemaker    MDT ADDR01/Implanted: 05/31/08  . Sleep apnea    cannot tolerate CPAP  . Symptomatic bradycardia      Allergies  Allergen Reactions  . Atorvastatin Swelling and Cough      Current Outpatient Medications  Medication Sig Dispense Refill  . acetaminophen (TYLENOL) 650 MG CR tablet Take 650 mg by mouth every 8 (eight) hours as needed for pain.    Marland Kitchen allopurinol (ZYLOPRIM) 300 MG tablet Take 300 mg by mouth daily.    Marland Kitchen amoxicillin (AMOXIL) 500 MG capsule Take 2,000 mg by mouth See admin instructions. Take 2000 mg by mouth 1 hour prior to dental appointment    . ascorbic acid (VITAMIN C) 500 MG tablet Take 500 mg by mouth daily.    Marland Kitchen docusate sodium (COLACE) 100 MG capsule Take 1 capsule (100 mg total) by mouth every 12 (twelve) hours. (Patient taking differently: Take 100 mg by mouth daily as needed for moderate constipation. ) 20 capsule 0  . DUREZOL 0.05 % EMUL Place 1 drop into the left eye See admin instructions. Begin after surgery. Place 1 drop in left eye 3 times daily and continue as directed  1  . ELIQUIS 5 MG TABS tablet TAKE ONE TABLET BY MOUTH TWICE DAILY 60 tablet 6  . furosemide (LASIX) 20 MG tablet Take 20 mg by mouth 2 (two) times daily.     . hydrochlorothiazide (HYDRODIURIL) 25 MG tablet Take 25 mg by mouth daily.    Marland Kitchen ketorolac (ACULAR) 0.4 % SOLN Place 1 drop into the left eye See admin instructions. Begin 3 days prior to  surgery. Place 1 drop in left eye 4 times daily, 1 drop morning of surgery and continue as directed  1  . lisinopril (PRINIVIL,ZESTRIL) 10 MG tablet TAKE ONE TABLET BY MOUTH EVERY DAY 90 tablet 1  . metoprolol succinate (TOPROL-XL) 25 MG 24 hr tablet TAKE ONE TABLET BY MOUTH EVERY DAY 90 tablet 1  . moxifloxacin (VIGAMOX) 0.5 % ophthalmic solution Place 1 drop into the left eye See admin instructions. Begin 3 days prior to surgery. Place 1 drop in left eye 3 times daily, the morning of surgery and continue for 1 week  1  . simvastatin (ZOCOR) 40 MG tablet Take 40 mg by mouth daily.     No current facility-administered medications for this visit.      Past Surgical History:  Procedure Laterality Date  . ABDOMINAL  HYSTERECTOMY    . fatty tumor removal to left arm    . PACEMAKER GENERATOR CHANGE  05/31/08   at [facility]: MDT ADDR01/Implanted: 05/31/08 as gen change by Dr Reinaldo Berber  . PACEMAKER INSERTION  1992     Allergies  Allergen Reactions  . Atorvastatin Swelling and Cough      Family History  Problem Relation Age of Onset  . Heart failure Mother      Social History Ms. Megan Walsh reports that she quit smoking about 43 years ago. Her smoking use included cigarettes. She started smoking about 72 years ago. She has a 30.00 pack-year smoking history. She has never used smokeless tobacco. Ms. Megan Walsh reports that she does not drink alcohol.   Review of Systems CONSTITUTIONAL: No weight loss, fever, chills, weakness or fatigue.  HEENT: Eyes: No visual loss, blurred vision, double vision or yellow sclerae.No hearing loss, sneezing, congestion, runny nose or sore throat.  SKIN: No rash or itching.  CARDIOVASCULAR: per hpi RESPIRATORY:per hpi GASTROINTESTINAL: No anorexia, nausea, vomiting or diarrhea. No abdominal pain or blood.  GENITOURINARY: No burning on urination, no polyuria NEUROLOGICAL: No headache, dizziness, syncope, paralysis, ataxia, numbness or tingling in the extremities. No change in bowel or bladder control.  MUSCULOSKELETAL: No muscle, back pain, joint pain or stiffness.  LYMPHATICS: No enlarged nodes. No history of splenectomy.  PSYCHIATRIC: No history of depression or anxiety.  ENDOCRINOLOGIC: No reports of sweating, cold or heat intolerance. No polyuria or polydipsia.  Marland Kitchen   Physical Examination Vitals:   07/28/17 1330  BP: 130/78  Pulse: 88  SpO2: 100%   Vitals:   07/28/17 1330  Weight: 158 lb (71.7 kg)  Height: 5\' 5"  (1.651 m)    Gen: resting comfortably, no acute distress HEENT: no scleral icterus, pupils equal round and reactive, no palptable cervical adenopathy,  CV: RRR, no m/r/g, no jvd Resp: Clear to auscultation bilaterally GI: abdomen is soft,  non-tender, non-distended, normal bowel sounds, no hepatosplenomegaly MSK: extremities are warm, 1-2+ bilateral LE edema Skin: warm, no rash Neuro:  no focal deficits Psych: appropriate affect   Diagnostic Studies 09/2014 echo Study Conclusions  - Left ventricle: The cavity size was normal. There was severe asymmetric hypertrophy of the septum. Systolic function was normal. The estimated ejection fraction was in the range of 60% to 65%. Wall motion was normal; there were no regional wall motion abnormalities. - Aortic valve: There was mild regurgitation. - Mitral valve: There was mild regurgitation directed centrally. - Left atrium: The atrium was moderately to severely dilated. - Tricuspid valve: There was moderate regurgitation. - Pulmonary arteries: Systolic pressure was mildly increased. PA peak pressure: 41 mm Hg (S).  Impressions:  - Appearance is consistent with hypertrophic cardiomyopathy with no outflow tract obstruction (at rest).      Assessment and Plan  1. Chest pain - negative workup during previous admission, including MPI with no ischemia - no current symptoms, conitnue to monitor.   2. Afib -no symptoms, continue current meds  3. Permanent pacemaker - continue to follow in device clinic.   4. Hypertrophic CM - no current symptoms, no outflow gradient on echo - continue to monitor at this time.   5. HTN -at goal, continue current meds        [name], M.D.

## 2017-07-29 DIAGNOSIS — R262 Difficulty in walking, not elsewhere classified: Secondary | ICD-10-CM | POA: Diagnosis not present

## 2017-07-29 DIAGNOSIS — I959 Hypotension, unspecified: Secondary | ICD-10-CM | POA: Diagnosis not present

## 2017-07-29 DIAGNOSIS — E119 Type 2 diabetes mellitus without complications: Secondary | ICD-10-CM | POA: Diagnosis not present

## 2017-07-29 DIAGNOSIS — R55 Syncope and collapse: Secondary | ICD-10-CM | POA: Diagnosis not present

## 2017-07-29 DIAGNOSIS — I4891 Unspecified atrial fibrillation: Secondary | ICD-10-CM | POA: Diagnosis not present

## 2017-07-29 DIAGNOSIS — M150 Primary generalized (osteo)arthritis: Secondary | ICD-10-CM | POA: Diagnosis not present

## 2017-07-31 ENCOUNTER — Encounter: Payer: Self-pay | Admitting: Cardiology

## 2017-08-01 ENCOUNTER — Telehealth: Payer: Self-pay | Admitting: *Deleted

## 2017-08-01 DIAGNOSIS — R55 Syncope and collapse: Secondary | ICD-10-CM | POA: Diagnosis not present

## 2017-08-01 DIAGNOSIS — R262 Difficulty in walking, not elsewhere classified: Secondary | ICD-10-CM | POA: Diagnosis not present

## 2017-08-01 DIAGNOSIS — M150 Primary generalized (osteo)arthritis: Secondary | ICD-10-CM | POA: Diagnosis not present

## 2017-08-01 DIAGNOSIS — I4891 Unspecified atrial fibrillation: Secondary | ICD-10-CM | POA: Diagnosis not present

## 2017-08-01 DIAGNOSIS — E119 Type 2 diabetes mellitus without complications: Secondary | ICD-10-CM | POA: Diagnosis not present

## 2017-08-01 DIAGNOSIS — I959 Hypotension, unspecified: Secondary | ICD-10-CM | POA: Diagnosis not present

## 2017-08-01 NOTE — Telephone Encounter (Signed)
Friday-155.2 lbs Saturday-155.2 lbs Sunday-153.4 lbs Monday-156.4 lbs  Confirmed that patient has been taking furosemide 40 mg in am and 20 mg at night

## 2017-08-02 DIAGNOSIS — I959 Hypotension, unspecified: Secondary | ICD-10-CM | POA: Diagnosis not present

## 2017-08-02 DIAGNOSIS — M150 Primary generalized (osteo)arthritis: Secondary | ICD-10-CM | POA: Diagnosis not present

## 2017-08-02 DIAGNOSIS — R262 Difficulty in walking, not elsewhere classified: Secondary | ICD-10-CM | POA: Diagnosis not present

## 2017-08-02 DIAGNOSIS — I4891 Unspecified atrial fibrillation: Secondary | ICD-10-CM | POA: Diagnosis not present

## 2017-08-02 DIAGNOSIS — R55 Syncope and collapse: Secondary | ICD-10-CM | POA: Diagnosis not present

## 2017-08-02 DIAGNOSIS — E119 Type 2 diabetes mellitus without complications: Secondary | ICD-10-CM | POA: Diagnosis not present

## 2017-08-02 NOTE — Telephone Encounter (Signed)
Not much change in weight, I would increase lasix to 40mg  bid for Tues, Wed, Thurs. Call and update [place] again on Friday   J Tallulah Hosman MD

## 2017-08-02 NOTE — Telephone Encounter (Signed)
Nurse Elmyra Ricks) made aware and voiced understanding - will update [place] again on Friday

## 2017-08-03 DIAGNOSIS — M150 Primary generalized (osteo)arthritis: Secondary | ICD-10-CM | POA: Diagnosis not present

## 2017-08-03 DIAGNOSIS — I959 Hypotension, unspecified: Secondary | ICD-10-CM | POA: Diagnosis not present

## 2017-08-03 DIAGNOSIS — R55 Syncope and collapse: Secondary | ICD-10-CM | POA: Diagnosis not present

## 2017-08-03 DIAGNOSIS — I4891 Unspecified atrial fibrillation: Secondary | ICD-10-CM | POA: Diagnosis not present

## 2017-08-03 DIAGNOSIS — E119 Type 2 diabetes mellitus without complications: Secondary | ICD-10-CM | POA: Diagnosis not present

## 2017-08-03 DIAGNOSIS — R262 Difficulty in walking, not elsewhere classified: Secondary | ICD-10-CM | POA: Diagnosis not present

## 2017-08-04 DIAGNOSIS — I959 Hypotension, unspecified: Secondary | ICD-10-CM | POA: Diagnosis not present

## 2017-08-04 DIAGNOSIS — E119 Type 2 diabetes mellitus without complications: Secondary | ICD-10-CM | POA: Diagnosis not present

## 2017-08-04 DIAGNOSIS — R55 Syncope and collapse: Secondary | ICD-10-CM | POA: Diagnosis not present

## 2017-08-04 DIAGNOSIS — M150 Primary generalized (osteo)arthritis: Secondary | ICD-10-CM | POA: Diagnosis not present

## 2017-08-04 DIAGNOSIS — I4891 Unspecified atrial fibrillation: Secondary | ICD-10-CM | POA: Diagnosis not present

## 2017-08-04 DIAGNOSIS — R262 Difficulty in walking, not elsewhere classified: Secondary | ICD-10-CM | POA: Diagnosis not present

## 2017-08-05 DIAGNOSIS — E119 Type 2 diabetes mellitus without complications: Secondary | ICD-10-CM | POA: Diagnosis not present

## 2017-08-05 DIAGNOSIS — R55 Syncope and collapse: Secondary | ICD-10-CM | POA: Diagnosis not present

## 2017-08-05 DIAGNOSIS — M150 Primary generalized (osteo)arthritis: Secondary | ICD-10-CM | POA: Diagnosis not present

## 2017-08-05 DIAGNOSIS — R262 Difficulty in walking, not elsewhere classified: Secondary | ICD-10-CM | POA: Diagnosis not present

## 2017-08-05 DIAGNOSIS — I4891 Unspecified atrial fibrillation: Secondary | ICD-10-CM | POA: Diagnosis not present

## 2017-08-05 DIAGNOSIS — I959 Hypotension, unspecified: Secondary | ICD-10-CM | POA: Diagnosis not present

## 2017-08-08 DIAGNOSIS — E119 Type 2 diabetes mellitus without complications: Secondary | ICD-10-CM | POA: Diagnosis not present

## 2017-08-08 DIAGNOSIS — R262 Difficulty in walking, not elsewhere classified: Secondary | ICD-10-CM | POA: Diagnosis not present

## 2017-08-08 DIAGNOSIS — M150 Primary generalized (osteo)arthritis: Secondary | ICD-10-CM | POA: Diagnosis not present

## 2017-08-08 DIAGNOSIS — I959 Hypotension, unspecified: Secondary | ICD-10-CM | POA: Diagnosis not present

## 2017-08-08 DIAGNOSIS — I4891 Unspecified atrial fibrillation: Secondary | ICD-10-CM | POA: Diagnosis not present

## 2017-08-08 DIAGNOSIS — R55 Syncope and collapse: Secondary | ICD-10-CM | POA: Diagnosis not present

## 2017-08-09 DIAGNOSIS — M150 Primary generalized (osteo)arthritis: Secondary | ICD-10-CM | POA: Diagnosis not present

## 2017-08-09 DIAGNOSIS — E119 Type 2 diabetes mellitus without complications: Secondary | ICD-10-CM | POA: Diagnosis not present

## 2017-08-09 DIAGNOSIS — I4891 Unspecified atrial fibrillation: Secondary | ICD-10-CM | POA: Diagnosis not present

## 2017-08-09 DIAGNOSIS — R55 Syncope and collapse: Secondary | ICD-10-CM | POA: Diagnosis not present

## 2017-08-09 DIAGNOSIS — R262 Difficulty in walking, not elsewhere classified: Secondary | ICD-10-CM | POA: Diagnosis not present

## 2017-08-09 DIAGNOSIS — I959 Hypotension, unspecified: Secondary | ICD-10-CM | POA: Diagnosis not present

## 2017-08-10 DIAGNOSIS — E119 Type 2 diabetes mellitus without complications: Secondary | ICD-10-CM | POA: Diagnosis not present

## 2017-08-10 DIAGNOSIS — I959 Hypotension, unspecified: Secondary | ICD-10-CM | POA: Diagnosis not present

## 2017-08-10 DIAGNOSIS — M150 Primary generalized (osteo)arthritis: Secondary | ICD-10-CM | POA: Diagnosis not present

## 2017-08-10 DIAGNOSIS — R55 Syncope and collapse: Secondary | ICD-10-CM | POA: Diagnosis not present

## 2017-08-10 DIAGNOSIS — R262 Difficulty in walking, not elsewhere classified: Secondary | ICD-10-CM | POA: Diagnosis not present

## 2017-08-10 DIAGNOSIS — I4891 Unspecified atrial fibrillation: Secondary | ICD-10-CM | POA: Diagnosis not present

## 2017-08-11 DIAGNOSIS — M150 Primary generalized (osteo)arthritis: Secondary | ICD-10-CM | POA: Diagnosis not present

## 2017-08-11 DIAGNOSIS — R55 Syncope and collapse: Secondary | ICD-10-CM | POA: Diagnosis not present

## 2017-08-11 DIAGNOSIS — E119 Type 2 diabetes mellitus without complications: Secondary | ICD-10-CM | POA: Diagnosis not present

## 2017-08-11 DIAGNOSIS — I959 Hypotension, unspecified: Secondary | ICD-10-CM | POA: Diagnosis not present

## 2017-08-11 DIAGNOSIS — R262 Difficulty in walking, not elsewhere classified: Secondary | ICD-10-CM | POA: Diagnosis not present

## 2017-08-11 DIAGNOSIS — I4891 Unspecified atrial fibrillation: Secondary | ICD-10-CM | POA: Diagnosis not present

## 2017-08-12 DIAGNOSIS — I4891 Unspecified atrial fibrillation: Secondary | ICD-10-CM | POA: Diagnosis not present

## 2017-08-12 DIAGNOSIS — R262 Difficulty in walking, not elsewhere classified: Secondary | ICD-10-CM | POA: Diagnosis not present

## 2017-08-12 DIAGNOSIS — E119 Type 2 diabetes mellitus without complications: Secondary | ICD-10-CM | POA: Diagnosis not present

## 2017-08-12 DIAGNOSIS — M150 Primary generalized (osteo)arthritis: Secondary | ICD-10-CM | POA: Diagnosis not present

## 2017-08-12 DIAGNOSIS — R55 Syncope and collapse: Secondary | ICD-10-CM | POA: Diagnosis not present

## 2017-08-12 DIAGNOSIS — I959 Hypotension, unspecified: Secondary | ICD-10-CM | POA: Diagnosis not present

## 2017-08-15 DIAGNOSIS — I959 Hypotension, unspecified: Secondary | ICD-10-CM | POA: Diagnosis not present

## 2017-08-15 DIAGNOSIS — M150 Primary generalized (osteo)arthritis: Secondary | ICD-10-CM | POA: Diagnosis not present

## 2017-08-15 DIAGNOSIS — R262 Difficulty in walking, not elsewhere classified: Secondary | ICD-10-CM | POA: Diagnosis not present

## 2017-08-15 DIAGNOSIS — I4891 Unspecified atrial fibrillation: Secondary | ICD-10-CM | POA: Diagnosis not present

## 2017-08-15 DIAGNOSIS — E119 Type 2 diabetes mellitus without complications: Secondary | ICD-10-CM | POA: Diagnosis not present

## 2017-08-15 DIAGNOSIS — R55 Syncope and collapse: Secondary | ICD-10-CM | POA: Diagnosis not present

## 2017-08-16 DIAGNOSIS — M6281 Muscle weakness (generalized): Secondary | ICD-10-CM | POA: Diagnosis not present

## 2017-08-16 DIAGNOSIS — Z7901 Long term (current) use of anticoagulants: Secondary | ICD-10-CM | POA: Diagnosis not present

## 2017-08-16 DIAGNOSIS — I959 Hypotension, unspecified: Secondary | ICD-10-CM | POA: Diagnosis not present

## 2017-08-16 DIAGNOSIS — I4891 Unspecified atrial fibrillation: Secondary | ICD-10-CM | POA: Diagnosis not present

## 2017-08-16 DIAGNOSIS — I509 Heart failure, unspecified: Secondary | ICD-10-CM | POA: Diagnosis not present

## 2017-08-16 DIAGNOSIS — M150 Primary generalized (osteo)arthritis: Secondary | ICD-10-CM | POA: Diagnosis not present

## 2017-08-16 DIAGNOSIS — R55 Syncope and collapse: Secondary | ICD-10-CM | POA: Diagnosis not present

## 2017-08-16 DIAGNOSIS — Z950 Presence of cardiac pacemaker: Secondary | ICD-10-CM | POA: Diagnosis not present

## 2017-08-16 DIAGNOSIS — H8149 Vertigo of central origin, unspecified ear: Secondary | ICD-10-CM | POA: Diagnosis not present

## 2017-08-16 DIAGNOSIS — Z9181 History of falling: Secondary | ICD-10-CM | POA: Diagnosis not present

## 2017-08-16 DIAGNOSIS — R262 Difficulty in walking, not elsewhere classified: Secondary | ICD-10-CM | POA: Diagnosis not present

## 2017-08-16 DIAGNOSIS — E119 Type 2 diabetes mellitus without complications: Secondary | ICD-10-CM | POA: Diagnosis not present

## 2017-08-17 DIAGNOSIS — I4891 Unspecified atrial fibrillation: Secondary | ICD-10-CM | POA: Diagnosis not present

## 2017-08-17 DIAGNOSIS — E119 Type 2 diabetes mellitus without complications: Secondary | ICD-10-CM | POA: Diagnosis not present

## 2017-08-17 DIAGNOSIS — R262 Difficulty in walking, not elsewhere classified: Secondary | ICD-10-CM | POA: Diagnosis not present

## 2017-08-17 DIAGNOSIS — I509 Heart failure, unspecified: Secondary | ICD-10-CM | POA: Diagnosis not present

## 2017-08-17 DIAGNOSIS — I959 Hypotension, unspecified: Secondary | ICD-10-CM | POA: Diagnosis not present

## 2017-08-17 DIAGNOSIS — M6281 Muscle weakness (generalized): Secondary | ICD-10-CM | POA: Diagnosis not present

## 2017-08-18 DIAGNOSIS — E119 Type 2 diabetes mellitus without complications: Secondary | ICD-10-CM | POA: Diagnosis not present

## 2017-08-18 DIAGNOSIS — M6281 Muscle weakness (generalized): Secondary | ICD-10-CM | POA: Diagnosis not present

## 2017-08-18 DIAGNOSIS — R262 Difficulty in walking, not elsewhere classified: Secondary | ICD-10-CM | POA: Diagnosis not present

## 2017-08-18 DIAGNOSIS — I4891 Unspecified atrial fibrillation: Secondary | ICD-10-CM | POA: Diagnosis not present

## 2017-08-18 DIAGNOSIS — I509 Heart failure, unspecified: Secondary | ICD-10-CM | POA: Diagnosis not present

## 2017-08-18 DIAGNOSIS — I959 Hypotension, unspecified: Secondary | ICD-10-CM | POA: Diagnosis not present

## 2017-08-19 DIAGNOSIS — R262 Difficulty in walking, not elsewhere classified: Secondary | ICD-10-CM | POA: Diagnosis not present

## 2017-08-19 DIAGNOSIS — I509 Heart failure, unspecified: Secondary | ICD-10-CM | POA: Diagnosis not present

## 2017-08-19 DIAGNOSIS — E119 Type 2 diabetes mellitus without complications: Secondary | ICD-10-CM | POA: Diagnosis not present

## 2017-08-19 DIAGNOSIS — I4891 Unspecified atrial fibrillation: Secondary | ICD-10-CM | POA: Diagnosis not present

## 2017-08-19 DIAGNOSIS — M6281 Muscle weakness (generalized): Secondary | ICD-10-CM | POA: Diagnosis not present

## 2017-08-19 DIAGNOSIS — I959 Hypotension, unspecified: Secondary | ICD-10-CM | POA: Diagnosis not present

## 2017-08-23 DIAGNOSIS — I959 Hypotension, unspecified: Secondary | ICD-10-CM | POA: Diagnosis not present

## 2017-08-23 DIAGNOSIS — I509 Heart failure, unspecified: Secondary | ICD-10-CM | POA: Diagnosis not present

## 2017-08-23 DIAGNOSIS — I4891 Unspecified atrial fibrillation: Secondary | ICD-10-CM | POA: Diagnosis not present

## 2017-08-23 DIAGNOSIS — M6281 Muscle weakness (generalized): Secondary | ICD-10-CM | POA: Diagnosis not present

## 2017-08-23 DIAGNOSIS — E119 Type 2 diabetes mellitus without complications: Secondary | ICD-10-CM | POA: Diagnosis not present

## 2017-08-23 DIAGNOSIS — R262 Difficulty in walking, not elsewhere classified: Secondary | ICD-10-CM | POA: Diagnosis not present

## 2017-08-25 DIAGNOSIS — E119 Type 2 diabetes mellitus without complications: Secondary | ICD-10-CM | POA: Diagnosis not present

## 2017-08-25 DIAGNOSIS — I509 Heart failure, unspecified: Secondary | ICD-10-CM | POA: Diagnosis not present

## 2017-08-25 DIAGNOSIS — I959 Hypotension, unspecified: Secondary | ICD-10-CM | POA: Diagnosis not present

## 2017-08-25 DIAGNOSIS — I4891 Unspecified atrial fibrillation: Secondary | ICD-10-CM | POA: Diagnosis not present

## 2017-08-25 DIAGNOSIS — R262 Difficulty in walking, not elsewhere classified: Secondary | ICD-10-CM | POA: Diagnosis not present

## 2017-08-25 DIAGNOSIS — M6281 Muscle weakness (generalized): Secondary | ICD-10-CM | POA: Diagnosis not present

## 2017-08-29 DIAGNOSIS — I509 Heart failure, unspecified: Secondary | ICD-10-CM | POA: Diagnosis not present

## 2017-08-29 DIAGNOSIS — M6281 Muscle weakness (generalized): Secondary | ICD-10-CM | POA: Diagnosis not present

## 2017-08-29 DIAGNOSIS — R262 Difficulty in walking, not elsewhere classified: Secondary | ICD-10-CM | POA: Diagnosis not present

## 2017-08-29 DIAGNOSIS — I959 Hypotension, unspecified: Secondary | ICD-10-CM | POA: Diagnosis not present

## 2017-08-29 DIAGNOSIS — I4891 Unspecified atrial fibrillation: Secondary | ICD-10-CM | POA: Diagnosis not present

## 2017-08-29 DIAGNOSIS — E119 Type 2 diabetes mellitus without complications: Secondary | ICD-10-CM | POA: Diagnosis not present

## 2017-08-31 DIAGNOSIS — E876 Hypokalemia: Secondary | ICD-10-CM | POA: Diagnosis not present

## 2017-08-31 DIAGNOSIS — I4891 Unspecified atrial fibrillation: Secondary | ICD-10-CM | POA: Diagnosis not present

## 2017-08-31 DIAGNOSIS — Z6825 Body mass index (BMI) 25.0-25.9, adult: Secondary | ICD-10-CM | POA: Diagnosis not present

## 2017-08-31 DIAGNOSIS — I10 Essential (primary) hypertension: Secondary | ICD-10-CM | POA: Diagnosis not present

## 2017-08-31 DIAGNOSIS — Z299 Encounter for prophylactic measures, unspecified: Secondary | ICD-10-CM | POA: Diagnosis not present

## 2017-09-01 DIAGNOSIS — I959 Hypotension, unspecified: Secondary | ICD-10-CM | POA: Diagnosis not present

## 2017-09-01 DIAGNOSIS — E119 Type 2 diabetes mellitus without complications: Secondary | ICD-10-CM | POA: Diagnosis not present

## 2017-09-01 DIAGNOSIS — R262 Difficulty in walking, not elsewhere classified: Secondary | ICD-10-CM | POA: Diagnosis not present

## 2017-09-01 DIAGNOSIS — I509 Heart failure, unspecified: Secondary | ICD-10-CM | POA: Diagnosis not present

## 2017-09-01 DIAGNOSIS — M6281 Muscle weakness (generalized): Secondary | ICD-10-CM | POA: Diagnosis not present

## 2017-09-01 DIAGNOSIS — I4891 Unspecified atrial fibrillation: Secondary | ICD-10-CM | POA: Diagnosis not present

## 2017-09-05 ENCOUNTER — Telehealth: Payer: Self-pay | Admitting: *Deleted

## 2017-09-05 ENCOUNTER — Encounter: Payer: Self-pay | Admitting: *Deleted

## 2017-09-05 DIAGNOSIS — I509 Heart failure, unspecified: Secondary | ICD-10-CM | POA: Diagnosis not present

## 2017-09-05 DIAGNOSIS — R262 Difficulty in walking, not elsewhere classified: Secondary | ICD-10-CM | POA: Diagnosis not present

## 2017-09-05 DIAGNOSIS — M6281 Muscle weakness (generalized): Secondary | ICD-10-CM | POA: Diagnosis not present

## 2017-09-05 DIAGNOSIS — I4891 Unspecified atrial fibrillation: Secondary | ICD-10-CM | POA: Diagnosis not present

## 2017-09-05 DIAGNOSIS — E119 Type 2 diabetes mellitus without complications: Secondary | ICD-10-CM | POA: Diagnosis not present

## 2017-09-05 DIAGNOSIS — I959 Hypotension, unspecified: Secondary | ICD-10-CM | POA: Diagnosis not present

## 2017-09-05 NOTE — Telephone Encounter (Signed)
Confirmed furosemide 20 mg BID with home health nurse Elmyra Ricks. Per Elmyra Ricks, patient went to [facility] after last phone note and the dose of furosemide was decreased upon discharge. Records being requested.

## 2017-09-05 NOTE — Telephone Encounter (Signed)
150.8 lbs 08/30/17 & 08/31/17 151.8 lbs 09/01/17 153.6 lbs 09/02/17 152.2 lbs 09/03/17   151.2 lbs 09/04/17 155.8 lbs 09/05/17  Currently taking furosemide 20 mg BID HCTZ was discontinued by PCP Dr. Manuella Ghazi on 08/31/17 d/t low BP  1+ BLE which is unchanged and baseline Sleeping in bed okay No sob, dizziness or chest pain

## 2017-09-05 NOTE — Telephone Encounter (Signed)
DId he adjust her lasix as well? Our last phone note was 40mg  bid   Zandra Abts MD

## 2017-09-08 DIAGNOSIS — I509 Heart failure, unspecified: Secondary | ICD-10-CM | POA: Diagnosis not present

## 2017-09-08 DIAGNOSIS — E119 Type 2 diabetes mellitus without complications: Secondary | ICD-10-CM | POA: Diagnosis not present

## 2017-09-08 DIAGNOSIS — M6281 Muscle weakness (generalized): Secondary | ICD-10-CM | POA: Diagnosis not present

## 2017-09-08 DIAGNOSIS — I959 Hypotension, unspecified: Secondary | ICD-10-CM | POA: Diagnosis not present

## 2017-09-08 DIAGNOSIS — I4891 Unspecified atrial fibrillation: Secondary | ICD-10-CM | POA: Diagnosis not present

## 2017-09-08 DIAGNOSIS — R262 Difficulty in walking, not elsewhere classified: Secondary | ICD-10-CM | POA: Diagnosis not present

## 2017-09-13 DIAGNOSIS — R262 Difficulty in walking, not elsewhere classified: Secondary | ICD-10-CM | POA: Diagnosis not present

## 2017-09-13 DIAGNOSIS — I4891 Unspecified atrial fibrillation: Secondary | ICD-10-CM | POA: Diagnosis not present

## 2017-09-13 DIAGNOSIS — M6281 Muscle weakness (generalized): Secondary | ICD-10-CM | POA: Diagnosis not present

## 2017-09-13 DIAGNOSIS — I959 Hypotension, unspecified: Secondary | ICD-10-CM | POA: Diagnosis not present

## 2017-09-13 DIAGNOSIS — E119 Type 2 diabetes mellitus without complications: Secondary | ICD-10-CM | POA: Diagnosis not present

## 2017-09-13 DIAGNOSIS — I509 Heart failure, unspecified: Secondary | ICD-10-CM | POA: Diagnosis not present

## 2017-09-20 DIAGNOSIS — I509 Heart failure, unspecified: Secondary | ICD-10-CM | POA: Diagnosis not present

## 2017-09-20 DIAGNOSIS — M6281 Muscle weakness (generalized): Secondary | ICD-10-CM | POA: Diagnosis not present

## 2017-09-20 DIAGNOSIS — I959 Hypotension, unspecified: Secondary | ICD-10-CM | POA: Diagnosis not present

## 2017-09-20 DIAGNOSIS — R262 Difficulty in walking, not elsewhere classified: Secondary | ICD-10-CM | POA: Diagnosis not present

## 2017-09-20 DIAGNOSIS — I4891 Unspecified atrial fibrillation: Secondary | ICD-10-CM | POA: Diagnosis not present

## 2017-09-20 DIAGNOSIS — E119 Type 2 diabetes mellitus without complications: Secondary | ICD-10-CM | POA: Diagnosis not present

## 2017-10-10 DIAGNOSIS — H2513 Age-related nuclear cataract, bilateral: Secondary | ICD-10-CM | POA: Diagnosis not present

## 2017-10-10 DIAGNOSIS — H2512 Age-related nuclear cataract, left eye: Secondary | ICD-10-CM | POA: Diagnosis not present

## 2017-10-10 DIAGNOSIS — H40053 Ocular hypertension, bilateral: Secondary | ICD-10-CM | POA: Diagnosis not present

## 2017-10-11 NOTE — Patient Instructions (Signed)
Your procedure is scheduled on: 10/17/2017   Report to [facility] at  52   AM.  Call this number if you have problems the morning of surgery: [phone]   Do not eat food or drink liquids :After Midnight.      Take these medicines the morning of surgery with A SIP OF WATER: allopurinol, gabapentin, lisinopril, metoprolol.   Do not wear jewelry, make-up or nail polish.  Do not wear lotions, powders, or perfumes. You may wear deodorant.  Do not shave 48 hours prior to surgery.  Do not bring valuables to the hospital.  Contacts, dentures or bridgework may not be worn into surgery.  Leave suitcase in the car. After surgery it may be brought to your room.  For patients admitted to the hospital, checkout time is 11:00 AM the day of discharge.   Patients discharged the day of surgery will not be allowed to drive home.  :     Please read over the following fact sheets that you were given: Coughing and Deep Breathing, Surgical Site Infection Prevention, Anesthesia Post-op Instructions and Care and Recovery After Surgery    Cataract A cataract is a clouding of the lens of the eye. When a lens becomes cloudy, vision is reduced based on the degree and nature of the clouding. Many cataracts reduce vision to some degree. Some cataracts make people more near-sighted as they develop. Other cataracts increase glare. Cataracts that are ignored and become worse can sometimes look white. The white color can be seen through the pupil. CAUSES   Aging. However, cataracts may occur at any age, even in newborns.   Certain drugs.   Trauma to the eye.   Certain diseases such as diabetes.   Specific eye diseases such as chronic inflammation inside the eye or a sudden attack of a rare form of glaucoma.   Inherited or acquired medical problems.  SYMPTOMS   Gradual, progressive drop in vision in the affected eye.   Severe, rapid visual loss. This most often happens when trauma is the cause.  DIAGNOSIS    To detect a cataract, an eye doctor examines the lens. Cataracts are best diagnosed with an exam of the eyes with the pupils enlarged (dilated) by drops.  TREATMENT  For an early cataract, vision may improve by using different eyeglasses or stronger lighting. If that does not help your vision, surgery is the only effective treatment. A cataract needs to be surgically removed when vision loss interferes with your everyday activities, such as driving, reading, or watching TV. A cataract may also have to be removed if it prevents examination or treatment of another eye problem. Surgery removes the cloudy lens and usually replaces it with a substitute lens (intraocular lens, IOL).  At a time when both you and your doctor agree, the cataract will be surgically removed. If you have cataracts in both eyes, only one is usually removed at a time. This allows the operated eye to heal and be out of danger from any possible problems after surgery (such as infection or poor wound healing). In rare cases, a cataract may be doing damage to your eye. In these cases, your caregiver may advise surgical removal right away. The vast majority of people who have cataract surgery have better vision afterward. HOME CARE INSTRUCTIONS  If you are not planning surgery, you may be asked to do the following:  Use different eyeglasses.   Use stronger or brighter lighting.   Ask your eye doctor  about reducing your medicine dose or changing medicines if it is thought that a medicine caused your cataract. Changing medicines does not make the cataract go away on its own.   Become familiar with your surroundings. Poor vision can lead to injury. Avoid bumping into things on the affected side. You are at a higher risk for tripping or falling.   Exercise extreme care when driving or operating machinery.   Wear sunglasses if you are sensitive to bright light or experiencing problems with glare.  SEEK IMMEDIATE MEDICAL CARE IF:   You  have a worsening or sudden vision loss.   You notice redness, swelling, or increasing pain in the eye.   You have a fever.  Document Released: 04/26/2005 Document Revised: 04/15/2011 Document Reviewed: 12/18/2010 [facility] Patient Information 2012 [place].PATIENT INSTRUCTIONS POST-ANESTHESIA  IMMEDIATELY FOLLOWING SURGERY:  Do not drive or operate machinery for the first twenty four hours after surgery.  Do not make any important decisions for twenty four hours after surgery or while taking narcotic pain medications or sedatives.  If you develop intractable nausea and vomiting or a severe headache please notify your doctor immediately.  FOLLOW-UP:  Please make an appointment with your surgeon as instructed. You do not need to follow up with anesthesia unless specifically instructed to do so.  WOUND CARE INSTRUCTIONS (if applicable):  Keep a dry clean dressing on the anesthesia/puncture wound site if there is drainage.  Once the wound has quit draining you may leave it open to air.  Generally you should leave the bandage intact for twenty four hours unless there is drainage.  If the epidural site drains for more than 36-48 hours please call the anesthesia department.  QUESTIONS?:  Please feel free to call your physician or the hospital operator if you have any questions, and they will be happy to assist you.

## 2017-10-12 DIAGNOSIS — Z299 Encounter for prophylactic measures, unspecified: Secondary | ICD-10-CM | POA: Diagnosis not present

## 2017-10-12 DIAGNOSIS — Z6825 Body mass index (BMI) 25.0-25.9, adult: Secondary | ICD-10-CM | POA: Diagnosis not present

## 2017-10-12 DIAGNOSIS — I10 Essential (primary) hypertension: Secondary | ICD-10-CM | POA: Diagnosis not present

## 2017-10-12 DIAGNOSIS — E1165 Type 2 diabetes mellitus with hyperglycemia: Secondary | ICD-10-CM | POA: Diagnosis not present

## 2017-10-12 DIAGNOSIS — I4891 Unspecified atrial fibrillation: Secondary | ICD-10-CM | POA: Diagnosis not present

## 2017-10-13 ENCOUNTER — Encounter (HOSPITAL_COMMUNITY)
Admission: RE | Admit: 2017-10-13 | Discharge: 2017-10-13 | Disposition: A | Discharge status: Home / Self Care | Payer: Medicare Other | Source: Ambulatory Visit | Attending: Ophthalmology | Admitting: Ophthalmology

## 2017-10-14 ENCOUNTER — Other Ambulatory Visit: Payer: Self-pay

## 2017-10-14 ENCOUNTER — Encounter (HOSPITAL_COMMUNITY)
Admission: RE | Admit: 2017-10-14 | Discharge: 2017-10-14 | Disposition: A | Discharge status: Home / Self Care | Payer: Medicare Other | Source: Ambulatory Visit | Attending: Ophthalmology | Admitting: Ophthalmology

## 2017-10-14 ENCOUNTER — Encounter (HOSPITAL_COMMUNITY): Payer: Self-pay

## 2017-10-14 DIAGNOSIS — E1136 Type 2 diabetes mellitus with diabetic cataract: Secondary | ICD-10-CM | POA: Diagnosis not present

## 2017-10-14 DIAGNOSIS — I10 Essential (primary) hypertension: Secondary | ICD-10-CM | POA: Diagnosis not present

## 2017-10-14 DIAGNOSIS — Z7901 Long term (current) use of anticoagulants: Secondary | ICD-10-CM | POA: Diagnosis not present

## 2017-10-14 DIAGNOSIS — Z79899 Other long term (current) drug therapy: Secondary | ICD-10-CM | POA: Diagnosis not present

## 2017-10-14 DIAGNOSIS — H2512 Age-related nuclear cataract, left eye: Secondary | ICD-10-CM | POA: Diagnosis not present

## 2017-10-14 LAB — CBC WITH DIFFERENTIAL/PLATELET
BASOS ABS: 0 10*3/uL (ref 0.0–0.1)
BASOS PCT: 1 %
Eosinophils Absolute: 0.1 10*3/uL (ref 0.0–0.7)
Eosinophils Relative: 4 %
HCT: 34.8 % — ABNORMAL LOW (ref 36.0–46.0)
Hemoglobin: 10.8 g/dL — ABNORMAL LOW (ref 12.0–15.0)
LYMPHS PCT: 39 %
Lymphs Abs: 1.2 10*3/uL (ref 0.7–4.0)
MCH: 28.3 pg (ref 26.0–34.0)
MCHC: 31 g/dL (ref 30.0–36.0)
MCV: 91.1 fL (ref 78.0–100.0)
Monocytes Absolute: 0.2 10*3/uL (ref 0.1–1.0)
Monocytes Relative: 7 %
NEUTROS ABS: 1.6 10*3/uL — AB (ref 1.7–7.7)
Neutrophils Relative %: 49 %
PLATELETS: 158 10*3/uL (ref 150–400)
RBC: 3.82 MIL/uL — AB (ref 3.87–5.11)
RDW: 13.8 % (ref 11.5–15.5)
WBC: 3.2 10*3/uL — AB (ref 4.0–10.5)

## 2017-10-14 LAB — HEMOGLOBIN A1C
Hgb A1c MFr Bld: 5.9 % — ABNORMAL HIGH (ref 4.8–5.6)
MEAN PLASMA GLUCOSE: 122.63 mg/dL

## 2017-10-14 LAB — BASIC METABOLIC PANEL
ANION GAP: 8 (ref 5–15)
BUN: 46 mg/dL — ABNORMAL HIGH (ref 6–20)
CALCIUM: 9.6 mg/dL (ref 8.9–10.3)
CO2: 29 mmol/L (ref 22–32)
Chloride: 105 mmol/L (ref 101–111)
Creatinine, Ser: 1.62 mg/dL — ABNORMAL HIGH (ref 0.44–1.00)
GFR, EST AFRICAN AMERICAN: 33 mL/min — AB (ref >60)
GFR, EST NON AFRICAN AMERICAN: 28 mL/min — AB (ref >60)
Glucose, Bld: 95 mg/dL (ref 65–99)
POTASSIUM: 4.3 mmol/L (ref 3.5–5.1)
SODIUM: 142 mmol/L (ref 135–145)

## 2017-10-14 LAB — GLUCOSE, CAPILLARY: Glucose-Capillary: 145 mg/dL — ABNORMAL HIGH (ref 65–99)

## 2017-10-17 ENCOUNTER — Ambulatory Visit (HOSPITAL_COMMUNITY): Payer: Medicare Other | Admitting: Anesthesiology

## 2017-10-17 ENCOUNTER — Ambulatory Visit (HOSPITAL_COMMUNITY)
Admission: RE | Admit: 2017-10-17 | Discharge: 2017-10-17 | Disposition: A | Discharge status: Home / Self Care | Payer: Medicare Other | Source: Ambulatory Visit | Attending: Ophthalmology | Admitting: Ophthalmology

## 2017-10-17 ENCOUNTER — Encounter (HOSPITAL_COMMUNITY)
Admission: RE | Disposition: A | Discharge status: Home / Self Care | Payer: Self-pay | Source: Ambulatory Visit | Attending: Ophthalmology

## 2017-10-17 ENCOUNTER — Other Ambulatory Visit: Payer: Self-pay

## 2017-10-17 ENCOUNTER — Encounter (HOSPITAL_COMMUNITY): Payer: Self-pay | Admitting: Emergency Medicine

## 2017-10-17 DIAGNOSIS — E1136 Type 2 diabetes mellitus with diabetic cataract: Secondary | ICD-10-CM | POA: Insufficient documentation

## 2017-10-17 DIAGNOSIS — H2512 Age-related nuclear cataract, left eye: Secondary | ICD-10-CM | POA: Insufficient documentation

## 2017-10-17 DIAGNOSIS — F17200 Nicotine dependence, unspecified, uncomplicated: Secondary | ICD-10-CM | POA: Diagnosis not present

## 2017-10-17 DIAGNOSIS — Z79899 Other long term (current) drug therapy: Secondary | ICD-10-CM | POA: Insufficient documentation

## 2017-10-17 DIAGNOSIS — Z7901 Long term (current) use of anticoagulants: Secondary | ICD-10-CM | POA: Diagnosis not present

## 2017-10-17 DIAGNOSIS — I252 Old myocardial infarction: Secondary | ICD-10-CM | POA: Diagnosis not present

## 2017-10-17 DIAGNOSIS — I2510 Atherosclerotic heart disease of native coronary artery without angina pectoris: Secondary | ICD-10-CM | POA: Diagnosis not present

## 2017-10-17 DIAGNOSIS — I10 Essential (primary) hypertension: Secondary | ICD-10-CM | POA: Diagnosis not present

## 2017-10-17 HISTORY — PX: CATARACT EXTRACTION W/PHACO: SHX586

## 2017-10-17 LAB — GLUCOSE, CAPILLARY: Glucose-Capillary: 116 mg/dL — ABNORMAL HIGH (ref 65–99)

## 2017-10-17 SURGERY — PHACOEMULSIFICATION, CATARACT, WITH IOL INSERTION
Anesthesia: Monitor Anesthesia Care | Site: Eye | Laterality: Left

## 2017-10-17 MED ORDER — PHENYLEPHRINE HCL 2.5 % OP SOLN
1.0000 [drp] | OPHTHALMIC | Status: AC
  Administered 2017-10-17 (×3): 1 [drp] via OPHTHALMIC

## 2017-10-17 MED ORDER — CYCLOPENTOLATE-PHENYLEPHRINE 0.2-1 % OP SOLN
1.0000 [drp] | OPHTHALMIC | Status: AC
  Administered 2017-10-17 (×3): 1 [drp] via OPHTHALMIC

## 2017-10-17 MED ORDER — POVIDONE-IODINE 5 % OP SOLN
OPHTHALMIC | Status: DC | PRN
  Administered 2017-10-17: 1 via OPHTHALMIC

## 2017-10-17 MED ORDER — BSS IO SOLN
INTRAOCULAR | Status: DC | PRN
  Administered 2017-10-17: 15 mL

## 2017-10-17 MED ORDER — LIDOCAINE HCL 3.5 % OP GEL
1.0000 "application " | Freq: Once | OPHTHALMIC | Status: AC
  Administered 2017-10-17: 1 via OPHTHALMIC

## 2017-10-17 MED ORDER — EPINEPHRINE PF 1 MG/ML IJ SOLN
INTRAOCULAR | Status: DC | PRN
  Administered 2017-10-17: 500 mL

## 2017-10-17 MED ORDER — LIDOCAINE 3.5 % OP GEL OPTIME - NO CHARGE
OPHTHALMIC | Status: DC | PRN
  Administered 2017-10-17: 1 [drp] via OPHTHALMIC

## 2017-10-17 MED ORDER — LACTATED RINGERS IV SOLN
INTRAVENOUS | Status: DC
  Administered 2017-10-17: 11:00:00 via INTRAVENOUS

## 2017-10-17 MED ORDER — NEOMYCIN-POLYMYXIN-DEXAMETH 3.5-10000-0.1 OP SUSP
OPHTHALMIC | Status: DC | PRN
  Administered 2017-10-17: 2 [drp] via OPHTHALMIC

## 2017-10-17 MED ORDER — PROVISC 10 MG/ML IO SOLN
INTRAOCULAR | Status: DC | PRN
  Administered 2017-10-17: 0.85 mL via INTRAOCULAR

## 2017-10-17 MED ORDER — LIDOCAINE HCL (PF) 1 % IJ SOLN
INTRAMUSCULAR | Status: DC | PRN
  Administered 2017-10-17: .5 mL

## 2017-10-17 MED ORDER — TETRACAINE HCL 0.5 % OP SOLN
1.0000 [drp] | OPHTHALMIC | Status: AC
  Administered 2017-10-17 (×3): 1 [drp] via OPHTHALMIC

## 2017-10-17 SURGICAL SUPPLY — 12 items
CLOTH BEACON ORANGE TIMEOUT ST (SAFETY) ×2 IMPLANT
EYE SHIELD UNIVERSAL CLEAR (GAUZE/BANDAGES/DRESSINGS) ×2 IMPLANT
GLOVE BIOGEL PI IND STRL 6.5 (GLOVE) IMPLANT
GLOVE BIOGEL PI IND STRL 7.0 (GLOVE) IMPLANT
GLOVE BIOGEL PI INDICATOR 6.5 (GLOVE) ×4
GLOVE BIOGEL PI INDICATOR 7.0 (GLOVE) ×2
LENS ALC ACRYL/TECN (Ophthalmic Related) ×2 IMPLANT
PAD ARMBOARD 7.5X6 YLW CONV (MISCELLANEOUS) ×2 IMPLANT
SYRINGE LUER LOK 1CC (MISCELLANEOUS) ×2 IMPLANT
TAPE SURG TRANSPORE 1 IN (GAUZE/BANDAGES/DRESSINGS) IMPLANT
TAPE SURGICAL TRANSPORE 1 IN (GAUZE/BANDAGES/DRESSINGS) ×2
WATER STERILE IRR 250ML POUR (IV SOLUTION) ×2 IMPLANT

## 2017-10-17 NOTE — Op Note (Signed)
Date of Admission: 10/17/2017  Date of Surgery: 10/17/2017  Pre-Op Dx: Cataract Left  Eye  Post-Op Dx: Senile Nuclear Cataract  Left  Eye,  Dx Code H25.12  Surgeon: Tonny Branch, M.D.  Assistants: None  Anesthesia: Topical with MAC  Indications: Painless, progressive loss of vision with compromise of daily activities.  Surgery: Cataract Extraction with Intraocular lens Implant Left Eye  Discription: The patient had dilating drops and viscous lidocaine placed into the Left eye in the pre-op holding area. After transfer to the operating room, a time out was performed. The patient was then prepped and draped. Beginning with a 77m blade a paracentesis port was made at the surgeon's 2 o'clock position. The anterior chamber was then filled with 1% non-preserved lidocaine. This was followed by filling the anterior chamber with Provisc.  A 2.462mkeratome blade was used to make a clear corneal incision at the temporal limbus.  A bent cystatome needle was used to create a continuous tear capsulotomy. Hydrodissection was performed with balanced salt solution on a Fine canula. The lens nucleus was then removed using the phacoemulsification handpiece. Residual cortex was removed with the I&A handpiece. The anterior chamber and capsular bag were refilled with Provisc. A posterior chamber intraocular lens was placed into the capsular bag with it's injector. The implant was positioned with the Kuglan hook. The Provisc was then removed from the anterior chamber and capsular bag with the I&A handpiece. Stromal hydration of the main incision and paracentesis port was performed with BSS on a Fine canula. The wounds were tested for leak which was negative. The patient tolerated the procedure well. There were no operative complications. The patient was then transferred to the recovery room in stable condition.  Complications: None  Specimen: None  EBL: None  Prosthetic device: J&J Technis, PCB00, power 19.5, SN  [id number]

## 2017-10-17 NOTE — Addendum Note (Signed)
Addendum  created 10/17/17 1311 by Vista Deck, CRNA   Intraprocedure Staff edited

## 2017-10-17 NOTE — H&P (Signed)
I have reviewed the H&P, the patient was re-examined, and I have identified no interval changes in medical condition and plan of care since the history and physical of record  

## 2017-10-17 NOTE — Anesthesia Preprocedure Evaluation (Signed)
Anesthesia Evaluation  Patient identified by MRN, date of birth, ID band Patient awake    Reviewed: Allergy & Precautions, H&P , NPO status , Patient's Chart, lab work & pertinent test results, reviewed documented beta blocker date and time   Airway Mallampati: II  TM Distance: >3 FB Neck ROM: limited    Dental no notable dental hx. (+) Missing, Teeth Intact, Dental Advidsory Given   Pulmonary neg pulmonary ROS, former smoker,    Pulmonary exam normal breath sounds clear to auscultation       Cardiovascular Exercise Tolerance: Good hypertension, On Medications + CAD, + Past MI and +CHF  negative cardio ROS  + dysrhythmias + pacemaker  Rhythm:regular Rate:Normal     Neuro/Psych negative neurological ROS  negative psych ROS   GI/Hepatic negative GI ROS, Neg liver ROS,   Endo/Other  negative endocrine ROSdiabetes, Well Controlled  Renal/GU CRFnegative Renal ROS  negative genitourinary   Musculoskeletal   Abdominal   Peds  Hematology negative hematology ROS (+)   Anesthesia Other Findings Remote h/o MI, with pacer placed (early 80's?) Admitted 1/19 for CHF, resolved without further sequellae  Reproductive/Obstetrics negative OB ROS                             Anesthesia Physical Anesthesia Plan  ASA: IV  Anesthesia Plan: MAC   Post-op Pain Management:    Induction:   PONV Risk Score and Plan:   Airway Management Planned:   Additional Equipment:   Intra-op Plan:   Post-operative Plan:   Informed Consent: I have reviewed the patients History and Physical, chart, labs and discussed the procedure including the risks, benefits and alternatives for the proposed anesthesia with the patient or authorized representative who has indicated his/her understanding and acceptance.   Dental Advisory Given  Plan Discussed with: CRNA and Anesthesiologist  Anesthesia Plan Comments:          Anesthesia Quick Evaluation

## 2017-10-17 NOTE — Anesthesia Postprocedure Evaluation (Signed)
Anesthesia Post Note  Patient: Megan Walsh  Procedure(s) Performed: CATARACT EXTRACTION PHACO AND INTRAOCULAR LENS PLACEMENT LEFT EYE (Left Eye)  Patient location during evaluation: Short Stay Anesthesia Type: MAC Level of consciousness: awake and patient cooperative Pain management: pain level controlled Vital Signs Assessment: post-procedure vital signs reviewed and stable Respiratory status: spontaneous breathing, nonlabored ventilation and respiratory function stable Cardiovascular status: blood pressure returned to baseline Postop Assessment: no apparent nausea or vomiting Anesthetic complications: no     Last Vitals:  Vitals:   10/17/17 1006 10/17/17 1047  BP: (!) 142/64 139/63  Pulse: 66 60  Resp: 17 16  Temp: (!) 36.4 C   SpO2: 97% 97%    Last Pain:  Vitals:   10/17/17 1006  TempSrc: Oral  PainSc: 0-No pain                 [name]

## 2017-10-17 NOTE — Transfer of Care (Signed)
Immediate Anesthesia Transfer of Care Note  Patient: Megan Walsh  Procedure(s) Performed: CATARACT EXTRACTION PHACO AND INTRAOCULAR LENS PLACEMENT LEFT EYE (Left Eye)  Patient Location: Short Stay  Anesthesia Type:MAC  Level of Consciousness: awake and alert   Airway & Oxygen Therapy: Patient Spontanous Breathing  Post-op Assessment: Report given to RN, Post -op Vital signs reviewed and stable and Patient moving all extremities  Post vital signs: Reviewed and stable  Last Vitals:  Vitals Value Taken Time  BP    Temp    Pulse    Resp    SpO2      Last Pain:  Vitals:   10/17/17 1006  TempSrc: Oral  PainSc: 0-No pain         Complications: No apparent anesthesia complications

## 2017-10-18 ENCOUNTER — Encounter (HOSPITAL_COMMUNITY): Payer: Self-pay | Admitting: Ophthalmology

## 2017-10-24 DIAGNOSIS — H2511 Age-related nuclear cataract, right eye: Secondary | ICD-10-CM | POA: Diagnosis not present

## 2017-10-27 ENCOUNTER — Encounter (HOSPITAL_COMMUNITY): Payer: Self-pay

## 2017-10-27 ENCOUNTER — Encounter (HOSPITAL_COMMUNITY)
Admission: RE | Admit: 2017-10-27 | Discharge: 2017-10-27 | Disposition: A | Discharge status: Home / Self Care | Payer: Medicare Other | Source: Ambulatory Visit | Attending: Ophthalmology | Admitting: Ophthalmology

## 2017-10-28 MED ORDER — HYDROMORPHONE HCL 1 MG/ML IJ SOLN: 0.2500 mg | INTRAMUSCULAR | Status: DC | PRN

## 2017-10-28 MED ORDER — PROMETHAZINE HCL 25 MG/ML IJ SOLN: 6.2500 mg | INTRAMUSCULAR | Status: DC | PRN

## 2017-10-28 MED ORDER — MEPERIDINE HCL 50 MG/ML IJ SOLN: 6.2500 mg | INTRAMUSCULAR | Status: DC | PRN

## 2017-10-28 MED ORDER — LACTATED RINGERS IV SOLN
INTRAVENOUS | Status: DC
  Administered 2017-10-31: 11:00:00 via INTRAVENOUS

## 2017-10-28 MED ORDER — HYDROCODONE-ACETAMINOPHEN 7.5-325 MG PO TABS: 1.0000 | ORAL_TABLET | Freq: Once | ORAL | Status: DC | PRN

## 2017-10-30 ENCOUNTER — Other Ambulatory Visit: Payer: Self-pay | Admitting: Cardiovascular Disease

## 2017-10-31 ENCOUNTER — Ambulatory Visit (HOSPITAL_COMMUNITY): Payer: Medicare Other | Admitting: Anesthesiology

## 2017-10-31 ENCOUNTER — Encounter (HOSPITAL_COMMUNITY)
Admission: RE | Disposition: A | Discharge status: Home / Self Care | Payer: Self-pay | Source: Ambulatory Visit | Attending: Ophthalmology

## 2017-10-31 ENCOUNTER — Encounter (HOSPITAL_COMMUNITY): Payer: Self-pay | Admitting: Anesthesiology

## 2017-10-31 ENCOUNTER — Other Ambulatory Visit: Payer: Self-pay

## 2017-10-31 ENCOUNTER — Ambulatory Visit (HOSPITAL_COMMUNITY)
Admission: RE | Admit: 2017-10-31 | Discharge: 2017-10-31 | Disposition: A | Discharge status: Home / Self Care | Payer: Medicare Other | Source: Ambulatory Visit | Attending: Ophthalmology | Admitting: Ophthalmology

## 2017-10-31 DIAGNOSIS — M1990 Unspecified osteoarthritis, unspecified site: Secondary | ICD-10-CM | POA: Diagnosis not present

## 2017-10-31 DIAGNOSIS — G4730 Sleep apnea, unspecified: Secondary | ICD-10-CM | POA: Insufficient documentation

## 2017-10-31 DIAGNOSIS — Z87891 Personal history of nicotine dependence: Secondary | ICD-10-CM | POA: Insufficient documentation

## 2017-10-31 DIAGNOSIS — H2511 Age-related nuclear cataract, right eye: Secondary | ICD-10-CM | POA: Diagnosis not present

## 2017-10-31 DIAGNOSIS — Z950 Presence of cardiac pacemaker: Secondary | ICD-10-CM | POA: Diagnosis not present

## 2017-10-31 DIAGNOSIS — Z7901 Long term (current) use of anticoagulants: Secondary | ICD-10-CM | POA: Diagnosis not present

## 2017-10-31 DIAGNOSIS — I2510 Atherosclerotic heart disease of native coronary artery without angina pectoris: Secondary | ICD-10-CM | POA: Diagnosis not present

## 2017-10-31 DIAGNOSIS — I110 Hypertensive heart disease with heart failure: Secondary | ICD-10-CM | POA: Diagnosis not present

## 2017-10-31 DIAGNOSIS — I509 Heart failure, unspecified: Secondary | ICD-10-CM | POA: Insufficient documentation

## 2017-10-31 DIAGNOSIS — F419 Anxiety disorder, unspecified: Secondary | ICD-10-CM | POA: Diagnosis not present

## 2017-10-31 DIAGNOSIS — Z79899 Other long term (current) drug therapy: Secondary | ICD-10-CM | POA: Insufficient documentation

## 2017-10-31 DIAGNOSIS — I10 Essential (primary) hypertension: Secondary | ICD-10-CM | POA: Diagnosis not present

## 2017-10-31 DIAGNOSIS — I252 Old myocardial infarction: Secondary | ICD-10-CM | POA: Diagnosis not present

## 2017-10-31 HISTORY — PX: CATARACT EXTRACTION W/PHACO: SHX586

## 2017-10-31 LAB — GLUCOSE, CAPILLARY: GLUCOSE-CAPILLARY: 82 mg/dL (ref 65–99)

## 2017-10-31 SURGERY — PHACOEMULSIFICATION, CATARACT, WITH IOL INSERTION
Anesthesia: Monitor Anesthesia Care | Site: Eye | Laterality: Right

## 2017-10-31 MED ORDER — LACTATED RINGERS IV SOLN
INTRAVENOUS | Status: DC
  Administered 2017-10-31: 12:00:00 via INTRAVENOUS

## 2017-10-31 MED ORDER — CYCLOPENTOLATE-PHENYLEPHRINE 0.2-1 % OP SOLN
1.0000 [drp] | OPHTHALMIC | Status: AC
  Administered 2017-10-31 (×3): 1 [drp] via OPHTHALMIC

## 2017-10-31 MED ORDER — BSS IO SOLN
INTRAOCULAR | Status: DC | PRN
  Administered 2017-10-31: 15 mL

## 2017-10-31 MED ORDER — BSS IO SOLN
INTRAOCULAR | Status: DC | PRN
  Administered 2017-10-31: 500 mL

## 2017-10-31 MED ORDER — NEOMYCIN-POLYMYXIN-DEXAMETH 3.5-10000-0.1 OP SUSP
OPHTHALMIC | Status: DC | PRN
  Administered 2017-10-31: 2 [drp] via OPHTHALMIC

## 2017-10-31 MED ORDER — LIDOCAINE HCL (PF) 1 % IJ SOLN
INTRAMUSCULAR | Status: DC | PRN
  Administered 2017-10-31: .6 mL

## 2017-10-31 MED ORDER — POVIDONE-IODINE 5 % OP SOLN
OPHTHALMIC | Status: DC | PRN
  Administered 2017-10-31: 1 via OPHTHALMIC

## 2017-10-31 MED ORDER — LIDOCAINE HCL 3.5 % OP GEL
1.0000 "application " | Freq: Once | OPHTHALMIC | Status: AC
  Administered 2017-10-31: 1 via OPHTHALMIC

## 2017-10-31 MED ORDER — PROVISC 10 MG/ML IO SOLN
INTRAOCULAR | Status: DC | PRN
  Administered 2017-10-31: 0.85 mL via INTRAOCULAR

## 2017-10-31 MED ORDER — TETRACAINE HCL 0.5 % OP SOLN
1.0000 [drp] | OPHTHALMIC | Status: AC
  Administered 2017-10-31 (×3): 1 [drp] via OPHTHALMIC

## 2017-10-31 MED ORDER — PHENYLEPHRINE HCL 2.5 % OP SOLN
1.0000 [drp] | OPHTHALMIC | Status: AC
  Administered 2017-10-31 (×3): 1 [drp] via OPHTHALMIC

## 2017-10-31 SURGICAL SUPPLY — 10 items

## 2017-10-31 NOTE — Anesthesia Preprocedure Evaluation (Signed)
Anesthesia Evaluation  Patient identified by MRN, date of birth, ID band Patient awake    Reviewed: Allergy & Precautions, H&P , NPO status , Patient's Chart, lab work & pertinent test results, reviewed documented beta blocker date and time   Airway Mallampati: II  TM Distance: >3 FB Neck ROM: limited    Dental no notable dental hx. (+) Missing, Teeth Intact, Dental Advidsory Given   Pulmonary neg pulmonary ROS, sleep apnea , former smoker,    Pulmonary exam normal breath sounds clear to auscultation       Cardiovascular Exercise Tolerance: Good hypertension, On Medications + CAD, + Past MI and +CHF  negative cardio ROS  + dysrhythmias + pacemaker  Rhythm:regular Rate:Normal     Neuro/Psych Anxiety negative neurological ROS  negative psych ROS   GI/Hepatic negative GI ROS, Neg liver ROS,   Endo/Other  negative endocrine ROSdiabetes, Well Controlled  Renal/GU Renal diseasenegative Renal ROS  negative genitourinary   Musculoskeletal  (+) Arthritis ,   Abdominal   Peds  Hematology negative hematology ROS (+)   Anesthesia Other Findings Remote h/o MI, with pacer placed (early 80's?) Admitted 1/19 for CHF, resolved without further sequellae  Reproductive/Obstetrics negative OB ROS                             Anesthesia Physical Anesthesia Plan  ASA: III  Anesthesia Plan: MAC   Post-op Pain Management:    Induction:   PONV Risk Score and Plan:   Airway Management Planned:   Additional Equipment:   Intra-op Plan:   Post-operative Plan:   Informed Consent:   Plan Discussed with: CRNA  Anesthesia Plan Comments:         Anesthesia Quick Evaluation

## 2017-10-31 NOTE — Anesthesia Postprocedure Evaluation (Signed)
Anesthesia Post Note  Patient: Megan Walsh  Procedure(s) Performed: CATARACT EXTRACTION PHACO AND INTRAOCULAR LENS PLACEMENT RIGHT EYE (Right Eye)  Patient location during evaluation: Short Stay Anesthesia Type: MAC Level of consciousness: awake and alert and oriented Pain management: pain level controlled Vital Signs Assessment: post-procedure vital signs reviewed and stable Respiratory status: spontaneous breathing Cardiovascular status: stable Postop Assessment: no apparent nausea or vomiting Anesthetic complications: no     Last Vitals:  Vitals:   10/31/17 1102  BP: 136/69  Pulse: 66  Temp: 36.5 C  SpO2: 97%    Last Pain:  Vitals:   10/31/17 1102  PainSc: 0-No pain                 Jenicka Coxe A

## 2017-10-31 NOTE — Transfer of Care (Signed)
Immediate Anesthesia Transfer of Care Note  Patient: Megan Walsh  Procedure(s) Performed: CATARACT EXTRACTION PHACO AND INTRAOCULAR LENS PLACEMENT RIGHT EYE (Right Eye)  Patient Location: Short Stay  Anesthesia Type:MAC  Level of Consciousness: awake, alert , oriented and patient cooperative  Airway & Oxygen Therapy: Patient Spontanous Breathing  Post-op Assessment: Report given to RN and Post -op Vital signs reviewed and stable  Post vital signs: Reviewed and stable  Last Vitals:  Vitals Value Taken Time  BP    Temp    Pulse    Resp    SpO2      Last Pain:  Vitals:   10/31/17 1102  PainSc: 0-No pain         Complications: No apparent anesthesia complications

## 2017-10-31 NOTE — H&P (Signed)
I have reviewed the H&P, the patient was re-examined, and I have identified no interval changes in medical condition and plan of care since the history and physical of record  

## 2017-10-31 NOTE — Discharge Instructions (Signed)
Monitored Anesthesia Care, Care After  These instructions provide you with information about caring for yourself after your procedure. Your health care provider may also give you more specific instructions. Your treatment has been planned according to current medical practices, but problems sometimes occur. Call your health care provider if you have any problems or questions after your procedure.  What can I expect after the procedure?  After your procedure, it is common to:   Feel sleepy for several hours.   Feel clumsy and have poor balance for several hours.   Feel forgetful about what happened after the procedure.   Have poor judgment for several hours.   Feel nauseous or vomit.   Have a sore throat if you had a breathing tube during the procedure.    Follow these instructions at home:  For at least 24 hours after the procedure:     Do not:  ? Participate in activities in which you could fall or become injured.  ? Drive.  ? Use heavy machinery.  ? Drink alcohol.  ? Take sleeping pills or medicines that cause drowsiness.  ? Make important decisions or sign legal documents.  ? Take care of children on your own.   Rest.  Eating and drinking   Follow the diet that is recommended by your health care provider.   If you vomit, drink water, juice, or soup when you can drink without vomiting.   Make sure you have little or no nausea before eating solid foods.  General instructions   Have a responsible adult stay with you until you are awake and alert.   Take over-the-counter and prescription medicines only as told by your health care provider.   If you smoke, do not smoke without supervision.   Keep all follow-up visits as told by your health care provider. This is important.  Contact a health care provider if:   You keep feeling nauseous or you keep vomiting.   You feel light-headed.   You develop a rash.   You have a fever.  Get help right away if:   You have trouble breathing.  This information is  not intended to replace advice given to you by your health care provider. Make sure you discuss any questions you have with your health care provider.  Document Released: 08/17/2015 Document Revised: 12/17/2015 Document Reviewed: 08/17/2015  Elsevier Interactive Patient Education  2018 [employer]

## 2017-10-31 NOTE — Op Note (Signed)
Date of Admission: 10/31/2017  Date of Surgery: 10/31/2017  Pre-Op Dx: Cataract Right  Eye  Post-Op Dx: Senile Nuclear Cataract  Right  Eye,  Dx Code H25.11  Surgeon: Tonny Branch, M.D.  Assistants: None  Anesthesia: Topical with MAC  Indications: Painless, progressive loss of vision with compromise of daily activities.  Surgery: Cataract Extraction with Intraocular lens Implant Right Eye  Discription: The patient had dilating drops and viscous lidocaine placed into the Right eye in the pre-op holding area. After transfer to the operating room, a time out was performed. The patient was then prepped and draped. Beginning with a 9m blade a paracentesis port was made at the surgeon's 2 o'clock position. The anterior chamber was then filled with 1% non-preserved lidocaine. This was followed by filling the anterior chamber with Provisc.  A 2.455mkeratome blade was used to make a clear corneal incision at the temporal limbus.  A bent cystatome needle was used to create a continuous tear capsulotomy. Hydrodissection was performed with balanced salt solution on a Fine canula. The lens nucleus was then removed using the phacoemulsification handpiece. Residual cortex was removed with the I&A handpiece. The anterior chamber and capsular bag were refilled with Provisc. A posterior chamber intraocular lens was placed into the capsular bag with it's injector. The implant was positioned with the Kuglan hook. The Provisc was then removed from the anterior chamber and capsular bag with the I&A handpiece. Stromal hydration of the main incision and paracentesis port was performed with BSS on a Fine canula. The wounds were tested for leak which was negative. The patient tolerated the procedure well. There were no operative complications. The patient was then transferred to the recovery room in stable condition.  Complications: None  Specimen: None  EBL: None  Prosthetic device: J&J Technis, PCB00, power 18.5, SN  [id number]

## 2017-10-31 NOTE — Anesthesia Procedure Notes (Signed)
Procedure Name: MAC Date/Time: 10/31/2017 11:37 AM Performed by: Andree Elk Amy A, CRNA Pre-anesthesia Checklist: Patient identified, Timeout performed, Emergency Drugs available, Suction available and Patient being monitored Oxygen Delivery Method: Nasal cannula

## 2017-11-01 ENCOUNTER — Encounter (HOSPITAL_COMMUNITY): Payer: Self-pay | Admitting: Ophthalmology

## 2017-11-07 ENCOUNTER — Ambulatory Visit (INDEPENDENT_AMBULATORY_CARE_PROVIDER_SITE_OTHER): Payer: Medicare Other | Admitting: Cardiology

## 2017-11-07 ENCOUNTER — Other Ambulatory Visit: Payer: Self-pay | Admitting: *Deleted

## 2017-11-07 ENCOUNTER — Encounter: Payer: Self-pay | Admitting: Cardiology

## 2017-11-07 ENCOUNTER — Other Ambulatory Visit: Payer: Self-pay

## 2017-11-07 VITALS — BP 168/78 | HR 81 | Ht 65.0 in | Wt 168.0 lb

## 2017-11-07 DIAGNOSIS — I421 Obstructive hypertrophic cardiomyopathy: Secondary | ICD-10-CM | POA: Diagnosis not present

## 2017-11-07 DIAGNOSIS — R0789 Other chest pain: Secondary | ICD-10-CM | POA: Diagnosis not present

## 2017-11-07 DIAGNOSIS — I890 Lymphedema, not elsewhere classified: Secondary | ICD-10-CM

## 2017-11-07 DIAGNOSIS — I4891 Unspecified atrial fibrillation: Secondary | ICD-10-CM | POA: Diagnosis not present

## 2017-11-07 DIAGNOSIS — Z950 Presence of cardiac pacemaker: Secondary | ICD-10-CM

## 2017-11-07 NOTE — Progress Notes (Signed)
Clinical Summary Megan Walsh is a 84 y.o.female seen today for follow up of the following medical problems.    1. Chronic diastolic HF - admit to [facility] 07/2017 with fluid overload -07/2017 echo showed LVEF 55-60%. Diuresed with improved symptoms - readmitted later in 07/2017 with hypotension and colitis.    Discharge weight 147-149 07/11/17. No significant sob/doe  - mild SOB at times. Ongoing LE edema. Taking asix 20mg  bid.    2. Syncope - episode in [place] when she had the flu.  - no recent episodes  3. History of chest pain - admitted 09/22/14 with chest pain. Notes describe atypical pain - negative workup for ACS, stress MPI showed small apical infarct, no current ischemia. - 09/3014 echo LVEF 60-65%, no WMAs, mild AI, mild MR, severe asymmetric hypertrophy of the septum reported 2.1 cm, no dynamic gradient.   - she denies any recent symptoms  4. Permanent pacemaker - pacemaker followed in device clinic  - no recent symptoms.   4. Afib - CHAD2Vasc score of 7, high risk. She is on eliquis   - denies any palpitations. No bleeding on anticoagulation   5. Hypertrophic CM - echo with severe asymmetric septal hypertrophy in 2016, no gradient.  -denies any recent symptoms.     6. HTN - bp meds previously lowered due to issues with low bp's - compliant with meds  7. Hyperlipidemia -compliant with statin   8. Lymphedema/LE edema - has compressionstockings at home - compliant with diuretics     Past Medical History:  Diagnosis Date  . Anxiety   . Arthritis   . CHF (congestive heart failure) ([place])   . Coronary artery disease   . Diabetes mellitus without complication ([place])   . Dysrhythmia    a-fib  . Fatty tumor   . History of gout   . Hypertension   . Myocardial infarction ([place])   . Permanent atrial fibrillation ([place])   . Presence of permanent cardiac pacemaker    MDT ADDR01/Implanted: 05/31/08  . Sleep apnea    cannot tolerate CPAP  . Symptomatic bradycardia      Allergies  Allergen Reactions  . Atorvastatin Swelling and Cough     Current Outpatient Medications  Medication Sig Dispense Refill  . acetaminophen (TYLENOL) 650 MG CR tablet Take 650 mg by mouth 2 (two) times daily as needed for pain.     Marland Kitchen allopurinol (ZYLOPRIM) 300 MG tablet Take 300 mg by mouth daily.    Marland Kitchen amoxicillin (AMOXIL) 500 MG capsule Take 2,000 mg by mouth See admin instructions. Take 2000 mg by mouth 1 hour prior to dental appointment    . ascorbic acid (VITAMIN C) 500 MG tablet Take 500 mg by mouth daily.    Marland Kitchen docusate sodium (COLACE) 100 MG capsule Take 1 capsule (100 mg total) by mouth every 12 (twelve) hours. (Patient taking differently: Take 100 mg by mouth daily as needed for moderate constipation. ) 20 capsule 0  . DUREZOL 0.05 % EMUL Place 1 drop into the left eye See admin instructions. Begin after surgery. Place 1 drop in left eye 3 times daily and continue as directed  1  . ELIQUIS 5 MG TABS tablet TAKE ONE TABLET BY MOUTH TWICE DAILY 60 tablet 6  . furosemide (LASIX) 20 MG tablet Take 20 mg by mouth 2 (two) times daily.     Marland Kitchen gabapentin (NEURONTIN) 100 MG capsule Take 100 mg by mouth 2 (two) times daily.    Marland Kitchen  ketorolac (ACULAR) 0.4 % SOLN Place 1 drop into the left eye See admin instructions. Begin 3 days prior to surgery. Place 1 drop in left eye 4 times daily, 1 drop morning of surgery and continue as directed  1  . lisinopril (PRINIVIL,ZESTRIL) 10 MG tablet TAKE ONE TABLET BY MOUTH EVERY DAY 90 tablet 3  . metoprolol succinate (TOPROL-XL) 25 MG 24 hr tablet TAKE ONE TABLET BY MOUTH EVERY DAY 90 tablet 1  . moxifloxacin (VIGAMOX) 0.5 % ophthalmic solution Place 1 drop into the left eye See admin instructions. Begin 3 days prior to surgery. Place 1 drop in left eye 3 times daily, the morning of surgery and continue for 1 week  1  . potassium chloride SA (K-DUR,KLOR-CON) 20 MEQ tablet Take 20 mEq by mouth daily.     . simvastatin (ZOCOR) 40 MG tablet Take 40 mg by mouth daily.     No current facility-administered medications for this visit.      Past Surgical History:  Procedure Laterality Date  . ABDOMINAL HYSTERECTOMY    . CATARACT EXTRACTION W/PHACO Left 10/17/2017   Procedure: CATARACT EXTRACTION PHACO AND INTRAOCULAR LENS PLACEMENT LEFT EYE;  Surgeon: Tonny , MD;  Location: [facility];  Service: Ophthalmology;  Laterality: Left;  CDE: 11.64  . CATARACT EXTRACTION W/PHACO Right 10/31/2017   Procedure: CATARACT EXTRACTION PHACO AND INTRAOCULAR LENS PLACEMENT RIGHT EYE;  Surgeon: Tonny , MD;  Location: [facility];  Service: Ophthalmology;  Laterality: Right;  CDE: 11.19  . fatty tumor removal to left arm Right   . PACEMAKER GENERATOR CHANGE  05/31/08   at [facility] MDT ADDR01/Implanted: 05/31/08 as gen change by Dr Reinaldo Berber  . PACEMAKER INSERTION  1992     Allergies  Allergen Reactions  . Atorvastatin Swelling and Cough      Family History  Problem Relation Age of Onset  . Heart failure Mother      Social History Megan Walsh reports that she quit smoking about 43 years ago. Her smoking use included cigarettes. She started smoking about 73 years ago. She has a 30.00 pack-year smoking history. She has never used smokeless tobacco. Megan Walsh reports that she does not drink alcohol.   Review of Systems CONSTITUTIONAL: No weight loss, fever, chills, weakness or fatigue.  HEENT: Eyes: No visual loss, blurred vision, double vision or yellow sclerae.No hearing loss, sneezing, congestion, runny nose or sore throat.  SKIN: No rash or itching.  CARDIOVASCULAR: per hpi RESPIRATORY: per hpi GASTROINTESTINAL: No anorexia, nausea, vomiting or diarrhea. No abdominal pain or blood.  GENITOURINARY: No burning on urination, no polyuria NEUROLOGICAL: No headache, dizziness, syncope, paralysis, ataxia, numbness or tingling in the extremities. No change in bowel or bladder control.    MUSCULOSKELETAL: No muscle, back pain, joint pain or stiffness.  LYMPHATICS: No enlarged nodes. No history of splenectomy.  PSYCHIATRIC: No history of depression or anxiety.  ENDOCRINOLOGIC: No reports of sweating, cold or heat intolerance. No polyuria or polydipsia.  Marland Kitchen   Physical Examination Vitals:   11/07/17 1048  BP: (!) 168/78  Pulse: 81  SpO2: 98%   Vitals:   11/07/17 1048  Weight: 168 lb (76.2 kg)  Height: 5\' 5"  (1.651 m)    Gen: resting comfortably, no acute distress HEENT: no scleral icterus, pupils equal round and reactive, no palptable cervical adenopathy,  GH:WEXHB, no m/r/g, no jvd Resp: Clear to auscultation bilaterally GI: abdomen is soft, non-tender, non-distended, normal bowel sounds, no hepatosplenomegaly MSK: extremities are warm, 2+ bilateral nonpitting edema  Skin: warm, no rash Neuro:  no focal deficits Psych: appropriate affect   Diagnostic Studies 09/2014 echo Study Conclusions  - Left ventricle: The cavity size was normal. There was severe asymmetric hypertrophy of the septum. Systolic function was normal. The estimated ejection fraction was in the range of 60% to 65%. Wall motion was normal; there were no regional wall motion abnormalities. - Aortic valve: There was mild regurgitation. - Mitral valve: There was mild regurgitation directed centrally. - Left atrium: The atrium was moderately to severely dilated. - Tricuspid valve: There was moderate regurgitation. - Pulmonary arteries: Systolic pressure was mildly increased. PA peak pressure: 41 mm Hg (S).  Impressions:  - Appearance is consistent with hypertrophic cardiomyopathy with no outflow tract obstruction (at rest).        Assessment and Plan   1. Chest pain - negative workup duringpreviousadmission, including MPI with no ischemia - no recent symptoms, continue to monitor.   2. Afib -doing well, continue current meds including anticoagulation  3.  Permanent pacemaker -continue to follow in device clinic.  - no recent symptoms, continue to monitor.   4. Hypertrophic CM - no current symptoms, no outflow gradient on echo -continue to monitor at this time.   5. HTN - elevated in clinic, prior issues with hypotension - no med changes today  6. LE edema/Lymphedema - ongoing swelling, appears more consistent with lymphedema. Worsening renal function on diuretics based on last months labs.   - we will refer to lymphedema clinic.       [name], MD

## 2017-11-07 NOTE — Patient Instructions (Addendum)
Your physician wants you to follow-up in: [place] will receive a reminder letter in the mail two months in advance. If you don't receive a letter, please call our office to schedule the follow-up appointment.  Your physician recommends that you continue on your current medications as directed. Please refer to the Current Medication list given to you today.  Your physician recommends that you return for lab work CMP/MG  You have been referred to [facility]     Thank you for choosing [facility]!!

## 2017-11-08 DIAGNOSIS — I890 Lymphedema, not elsewhere classified: Secondary | ICD-10-CM | POA: Diagnosis not present

## 2017-11-09 ENCOUNTER — Telehealth: Payer: Self-pay | Admitting: Physician Assistant

## 2017-11-09 DIAGNOSIS — M1990 Unspecified osteoarthritis, unspecified site: Secondary | ICD-10-CM | POA: Diagnosis not present

## 2017-11-09 DIAGNOSIS — Z87891 Personal history of nicotine dependence: Secondary | ICD-10-CM | POA: Diagnosis not present

## 2017-11-09 DIAGNOSIS — Z8639 Personal history of other endocrine, nutritional and metabolic disease: Secondary | ICD-10-CM | POA: Diagnosis not present

## 2017-11-09 DIAGNOSIS — I4891 Unspecified atrial fibrillation: Secondary | ICD-10-CM | POA: Diagnosis not present

## 2017-11-09 DIAGNOSIS — I110 Hypertensive heart disease with heart failure: Secondary | ICD-10-CM | POA: Diagnosis not present

## 2017-11-09 DIAGNOSIS — Z7901 Long term (current) use of anticoagulants: Secondary | ICD-10-CM | POA: Diagnosis not present

## 2017-11-09 DIAGNOSIS — I509 Heart failure, unspecified: Secondary | ICD-10-CM | POA: Diagnosis not present

## 2017-11-09 DIAGNOSIS — E119 Type 2 diabetes mellitus without complications: Secondary | ICD-10-CM | POA: Diagnosis not present

## 2017-11-09 DIAGNOSIS — R0602 Shortness of breath: Secondary | ICD-10-CM | POA: Diagnosis not present

## 2017-11-09 DIAGNOSIS — Z79899 Other long term (current) drug therapy: Secondary | ICD-10-CM | POA: Diagnosis not present

## 2017-11-09 NOTE — Telephone Encounter (Signed)
The patient's called the answering service after-hours today. H/o reviewed, cDHF, syncope, CP, CKD, afib, PPM, hypertrophic CM, lymphedema. Last Cr uptrending in 10/2017. Spoke with pt on phone - having chest pain, difficulty breathing, increased swelling. They are on the way to ER which I agree with. The patient verbalized understanding and gratitude for callback.  Larene Ascencio PA-C

## 2017-11-11 ENCOUNTER — Other Ambulatory Visit: Payer: Self-pay

## 2017-11-11 ENCOUNTER — Encounter: Payer: Self-pay | Admitting: *Deleted

## 2017-11-11 ENCOUNTER — Encounter: Payer: Self-pay | Admitting: Internal Medicine

## 2017-11-11 ENCOUNTER — Ambulatory Visit (INDEPENDENT_AMBULATORY_CARE_PROVIDER_SITE_OTHER): Payer: Medicare Other | Admitting: Internal Medicine

## 2017-11-11 VITALS — BP 162/68 | HR 84 | Ht 65.0 in | Wt 158.0 lb

## 2017-11-11 DIAGNOSIS — I5033 Acute on chronic diastolic (congestive) heart failure: Secondary | ICD-10-CM | POA: Diagnosis not present

## 2017-11-11 DIAGNOSIS — I4891 Unspecified atrial fibrillation: Secondary | ICD-10-CM | POA: Diagnosis not present

## 2017-11-11 DIAGNOSIS — Z950 Presence of cardiac pacemaker: Secondary | ICD-10-CM

## 2017-11-11 DIAGNOSIS — I495 Sick sinus syndrome: Secondary | ICD-10-CM

## 2017-11-11 DIAGNOSIS — I10 Essential (primary) hypertension: Secondary | ICD-10-CM

## 2017-11-11 LAB — CUP PACEART INCLINIC DEVICE CHECK
Battery Impedance: 2058 Ohm
Battery Remaining Longevity: 33 mo
Battery Voltage: 2.78 V
Brady Statistic RV Percent Paced: 72 %
Implantable Lead Implant Date: 19980116
Implantable Lead Location: 753860
Implantable Lead Model: 5034
Implantable Lead Model: 5534
Lead Channel Impedance Value: 1300 Ohm
Lead Channel Impedance Value: 67 Ohm
Lead Channel Pacing Threshold Pulse Width: 0.4 ms
Lead Channel Setting Pacing Pulse Width: 0.4 ms
Lead Channel Setting Sensing Sensitivity: 5.6 mV
MDC IDC LEAD IMPLANT DT: 19980116
MDC IDC LEAD LOCATION: 753859
MDC IDC MSMT LEADCHNL RV PACING THRESHOLD AMPLITUDE: 0.375 V
MDC IDC MSMT LEADCHNL RV PACING THRESHOLD AMPLITUDE: 0.5 V
MDC IDC MSMT LEADCHNL RV PACING THRESHOLD PULSEWIDTH: 0.4 ms
MDC IDC MSMT LEADCHNL RV SENSING INTR AMPL: 11.2 mV
MDC IDC PG IMPLANT DT: 20100122
MDC IDC SESS DTM: 20190705132449
MDC IDC SET LEADCHNL RV PACING AMPLITUDE: 2.5 V

## 2017-11-11 MED ORDER — LISINOPRIL 20 MG PO TABS: 20.0000 mg | ORAL_TABLET | Freq: Every day | ORAL | 11 refills | Status: DC

## 2017-11-11 NOTE — Progress Notes (Signed)
PCP: Monico Blitz, MD Primary Cardiologist: Dr Harl Bowie Primary EP:  Dr [name] is a 84 y.o. female who presents today for routine electrophysiology followup.  Since last being seen in our clinic, the patient reports doing reasonably well.  She did go to [facility] 11/09/17 (records currently not available) for SOB and chest discomfort.  She reports that she received IV lasix and had resolution of symptoms.  She now feels that she is "filling back up with fluid".  + SOB (mild),  No chest pain.  Edema appears at baseline.   Today, she denies symptoms of palpitations,  dizziness, presyncope, or syncope.  The patient is otherwise without complaint today.   Past Medical History:  Diagnosis Date  . Anxiety   . Arthritis   . CHF (congestive heart failure) ([place])   . Coronary artery disease   . Diabetes mellitus without complication ([place])   . Dysrhythmia    a-fib  . Fatty tumor   . History of gout   . Hypertension   . Myocardial infarction ([place])   . Permanent atrial fibrillation ([place])   . Presence of permanent cardiac pacemaker    MDT ADDR01/Implanted: 05/31/08  . Sleep apnea    cannot tolerate CPAP  . Symptomatic bradycardia    Past Surgical History:  Procedure Laterality Date  . ABDOMINAL HYSTERECTOMY    . CATARACT EXTRACTION W/PHACO Left 10/17/2017   Procedure: CATARACT EXTRACTION PHACO AND INTRAOCULAR LENS PLACEMENT LEFT EYE;  Surgeon: Tonny Branch, MD;  Location: [facility];  Service: Ophthalmology;  Laterality: Left;  CDE: 11.64  . CATARACT EXTRACTION W/PHACO Right 10/31/2017   Procedure: CATARACT EXTRACTION PHACO AND INTRAOCULAR LENS PLACEMENT RIGHT EYE;  Surgeon: Tonny Branch, MD;  Location: [facility];  Service: Ophthalmology;  Laterality: Right;  CDE: 11.19  . fatty tumor removal to left arm Right   . PACEMAKER GENERATOR CHANGE  05/31/08   at [facility]: MDT ADDR01/Implanted: 05/31/08 as gen change by Dr Reinaldo Berber  . PACEMAKER INSERTION  1992    ROS- all systems are  reviewed and negative except as per HPI above  Current Outpatient Medications  Medication Sig Dispense Refill  . acetaminophen (TYLENOL) 650 MG CR tablet Take 650 mg by mouth 2 (two) times daily as needed for pain.     Marland Kitchen allopurinol (ZYLOPRIM) 300 MG tablet Take 300 mg by mouth daily.    Marland Kitchen amoxicillin (AMOXIL) 500 MG capsule Take 2,000 mg by mouth See admin instructions. Take 2000 mg by mouth 1 hour prior to dental appointment    . ascorbic acid (VITAMIN C) 500 MG tablet Take 500 mg by mouth daily.    Marland Kitchen docusate sodium (COLACE) 100 MG capsule Take 1 capsule (100 mg total) by mouth every 12 (twelve) hours. (Patient taking differently: Take 100 mg by mouth daily as needed for moderate constipation. ) 20 capsule 0  . DUREZOL 0.05 % EMUL Place 1 drop into the left eye See admin instructions. Begin after surgery. Place 1 drop in left eye 3 times daily and continue as directed  1  . ELIQUIS 5 MG TABS tablet TAKE ONE TABLET BY MOUTH TWICE DAILY 60 tablet 6  . furosemide (LASIX) 20 MG tablet Take 20 mg by mouth 2 (two) times daily.     Marland Kitchen gabapentin (NEURONTIN) 100 MG capsule Take 100 mg by mouth 2 (two) times daily.    Marland Kitchen ketorolac (ACULAR) 0.4 % SOLN Place 1 drop into the left eye See admin instructions. Begin  3 days prior to surgery. Place 1 drop in left eye 4 times daily, 1 drop morning of surgery and continue as directed  1  . lisinopril (PRINIVIL,ZESTRIL) 10 MG tablet TAKE ONE TABLET BY MOUTH EVERY DAY 90 tablet 3  . metoprolol succinate (TOPROL-XL) 25 MG 24 hr tablet TAKE ONE TABLET BY MOUTH EVERY DAY 90 tablet 1  . moxifloxacin (VIGAMOX) 0.5 % ophthalmic solution Place 1 drop into the left eye See admin instructions. Begin 3 days prior to surgery. Place 1 drop in left eye 3 times daily, the morning of surgery and continue for 1 week  1  . potassium chloride SA (K-DUR,KLOR-CON) 20 MEQ tablet Take 20 mEq by mouth daily.    . simvastatin (ZOCOR) 40 MG tablet Take 40 mg by mouth daily.     No current  facility-administered medications for this visit.     Physical Exam: Vitals:   11/11/17 1058  BP: (!) 162/68  Pulse: 84  SpO2: 95%  Weight: 158 lb (71.7 kg)  Height: 5\' 5"  (1.651 m)    GEN- The patient is elderly appearing, alert and oriented x 3 today.   Head- normocephalic, atraumatic Eyes-  Sclera clear, conjunctiva pink Ears- hearing intact Oropharynx- clear Neck + JVD Lungs- few basilar rales, normal work of breathing Chest- pacemaker pocket is well healed Heart- Regular rate and rhythm, no murmurs, rubs or gallops, PMI not laterally displaced GI- soft, NT, ND, + BS Extremities- no clubbing, cyanosis, + chronic edema  Pacemaker interrogation- reviewed in detail today,  See PACEART report   Assessment and Plan:  1. Symptomatic bradycardia  Normal pacemaker function See Pace Art report No changes today  2. Permanent afib V rates are well controlled On eliquis for chads2vasc score of 7 Labs followed by PCP  3. HTN Elevated today Increase lasix to 40mg  BID x 3 days, 2 gram sodium restriction, increase lisinopril to 20mg  daily Severe asymmetric hypertrophy of the septum noted previously on echo with no gradient  4. Acute on chronic diastolic dysfunction Volume overloaded today (mild) Increase lasix to 40mg  BID x 3 days, 2 gram sodium restriction, increase lisinopril to 20mg  daily  [facility] Return to see me in 1 year  Follow-up with Dr Harl Bowie within the next 2 weeks for further post hospital management I will request records from [place] MD, [facility] 11/11/2017 11:28 AM

## 2017-11-11 NOTE — Patient Instructions (Addendum)
Medication Instructions:   Increase Lasix to 40mg  twice a day X 3 days only, then back to regular dose.  Increase Lisinopril to 20mg  daily.   Continue all other medications.    Labwork: none  Testing/Procedures: none  Follow-Up: Your physician wants you to follow up in:  1 year.  You will receive a reminder letter in the mail one-two months in advance.  If you don't receive a letter, please call our office to schedule the follow up appointment   Any Other Special Instructions Will Be Listed Below (If Applicable).  Remote monitoring is used to monitor your Pacemaker of ICD from home. This monitoring reduces the number of office visits required to check your device to one time per year. It allows [place] to keep an eye on the functioning of your device to ensure it is working properly. You are scheduled for a device check from home on 02/13/2018. You may send your transmission at any time that day. If you have a wireless device, the transmission will be sent automatically. After your physician reviews your transmission, you will receive a postcard with your next transmission date.  2 gram low sodium diet   If you need a refill on your cardiac medications before your next appointment, please call your pharmacy.

## 2017-11-13 ENCOUNTER — Encounter: Payer: Self-pay | Admitting: Cardiology

## 2017-11-22 ENCOUNTER — Other Ambulatory Visit: Payer: Self-pay

## 2017-11-22 ENCOUNTER — Telehealth (HOSPITAL_COMMUNITY): Payer: Self-pay | Admitting: Physical Therapy

## 2017-11-22 ENCOUNTER — Ambulatory Visit (HOSPITAL_COMMUNITY): Payer: Medicare Other | Attending: Cardiology | Admitting: Physical Therapy

## 2017-11-22 ENCOUNTER — Encounter (HOSPITAL_COMMUNITY): Payer: Self-pay | Admitting: Physical Therapy

## 2017-11-22 DIAGNOSIS — I890 Lymphedema, not elsewhere classified: Secondary | ICD-10-CM | POA: Diagnosis not present

## 2017-11-22 NOTE — Telephone Encounter (Signed)
L/M with patient's son that we needed to schedule some more apptments with his mom. NF Holding Thurs (90Mins) with Amy. NF7/16/19

## 2017-11-22 NOTE — Therapy (Signed)
[place], [place], [address] Phone: [phone]   Fax:  [phone]  Physical Therapy Evaluation  Patient Details  Name: Megan Walsh MRN: [id number] Date of Birth: [birth year] Referring Provider: Carlyle Dolly    Encounter Date: 11/22/2017  PT End of Session - 11/22/17 1147    Visit Number  1    Number of Visits  12    Date for PT Re-Evaluation  12/22/17    Authorization - Visit Number  1    Authorization - Number of Visits  12    PT Start Time  1000 Pt late for appointment     PT Stop Time  1110    PT Time Calculation (min)  70 min    Activity Tolerance  Patient tolerated treatment well    Behavior During Therapy  [facility] for tasks assessed/performed       Past Medical History:  Diagnosis Date  . Anxiety   . Arthritis   . CHF (congestive heart failure) ([place])   . Coronary artery disease   . Diabetes mellitus without complication ([place])   . Dysrhythmia    a-fib  . Fatty tumor   . History of gout   . Hypertension   . Myocardial infarction ([place])   . Permanent atrial fibrillation ([place])   . Presence of permanent cardiac pacemaker    MDT ADDR01/Implanted: 05/31/08  . Sleep apnea    cannot tolerate CPAP  . Symptomatic bradycardia     Past Surgical History:  Procedure Laterality Date  . ABDOMINAL HYSTERECTOMY    . CATARACT EXTRACTION W/PHACO Left 10/17/2017   Procedure: CATARACT EXTRACTION PHACO AND INTRAOCULAR LENS PLACEMENT LEFT EYE;  Surgeon: Tonny Branch, MD;  Location: [facility];  Service: Ophthalmology;  Laterality: Left;  CDE: 11.64  . CATARACT EXTRACTION W/PHACO Right 10/31/2017   Procedure: CATARACT EXTRACTION PHACO AND INTRAOCULAR LENS PLACEMENT RIGHT EYE;  Surgeon: Tonny Branch, MD;  Location: [facility];  Service: Ophthalmology;  Laterality: Right;  CDE: 11.19  . fatty tumor removal to left arm Right   . PACEMAKER GENERATOR CHANGE  05/31/08   at [facility]: MDT ADDR01/Implanted: 05/31/08 as gen change by Dr  Reinaldo Berber  . PACEMAKER INSERTION  1992    There were no vitals filed for this visit.   Subjective Assessment - 11/22/17 1006    Subjective  Megan Walsh states that her legs have been swelling since she was 21.  She has been given diuretics but they do not seem to help.  She has recently been told that she has lymphedema and has been sent to therapy for treatment.   The patient states that her sister has recently been diagnosed as well      Pertinent History  CHF     How long can you sit comfortably?  no problem     How long can you stand comfortably?  able to stand for five to 10 minutes     How long can you walk comfortably?  Pt states that lately she has been dragging her right leg.   Able to walk 10-15 mintues     Patient Stated Goals  legs to be smaller , fit in her shoes     Currently in Pain?  Yes    Pain Score  7     Pain Location  Knee    Pain Orientation  Right;Left    Pain Descriptors / Indicators  Aching;Tightness;Throbbing    Pain Type  Chronic pain  Pain Onset  More than a month ago    Pain Frequency  Constant    Aggravating Factors   having legs down     Pain Relieving Factors  nothing     Effect of Pain on Daily Activities  limits          [facility] PT Assessment - 11/22/17 0001      Assessment   Medical Diagnosis  B Lymphedma    Referring Provider  Carlyle Dolly     Onset Date/Surgical Date  -- chronic    Next MD Visit  11/27/2017    Prior Therapy  none      Precautions   Precautions  Other (comment)    Precaution Comments  cellulitis      Restrictions   Weight Bearing Restrictions  No      Balance Screen   Has the patient fallen in the past 6 months  Yes    How many times?  3    Has the patient had a decrease in activity level because of a fear of falling?   Yes    Is the patient reluctant to leave their home because of a fear of falling?   No      Prior Function   Level of Independence  Independent      Cognition   Overall Cognitive Status   Within Functional Limits for tasks assessed        LYMPHEDEMA/ONCOLOGY QUESTIONNAIRE - 11/22/17 1013      What other symptoms do you have   Are you Having Heaviness or Tightness  Yes    Are you having Pain  Yes    Are you having pitting edema  Yes    Is it Hard or Difficult finding clothes that fit  Yes    Stemmer Sign  Yes      Lymphedema Stage   Stage  STAGE 2 SPONTANEOUSLY IRREVERSIBLE      Lymphedema Assessments   Lymphedema Assessments  Lower extremities      Right Lower Extremity Lymphedema   20 cm Proximal to Suprapatella  54.5 cm    10 cm Proximal to Suprapatella  47.5 cm    At Midpatella/Popliteal Crease  46 cm    30 cm Proximal to Floor at Lateral Plantar Foot  42.5 cm    20 cm Proximal to Floor at Lateral Plantar Foot  38.5 1    10  cm Proximal to Floor at Lateral Malleoli  37.5 cm    Circumference of ankle/heel  40.7 cm.    5 cm Proximal to 1st MTP Joint  28.3 cm    Across MTP Joint  27.7 cm    Around Proximal Great Toe  10 cm      Left Lower Extremity Lymphedema   20 cm Proximal to Suprapatella  53.8 cm    10 cm Proximal to Suprapatella  46.7 cm    At Midpatella/Popliteal Crease  43.5 cm    30 cm Proximal to Floor at Lateral Plantar Foot  42.3 cm    20 cm Proximal to Floor at Lateral Plantar Foot  38.5 cm    10 cm Proximal to Floor at Lateral Malleoli  33.8 cm    Circumference of ankle/heel  41 cm.    5 cm Proximal to 1st MTP Joint  29.2 cm    Across MTP Joint  27.6 cm    Around Proximal Great Toe  10.4 cm  Objective measurements completed on examination: See above findings.      [facility] Adult PT Treatment/Exercise - 11/22/17 0001      Exercises   Exercises  -- ankle pump, LAQ, hip AB/AD; ER/IR, marching,abdominal breath      Manual Therapy   Manual Therapy  Compression Bandaging    Manual therapy comments  done seperate from all other aspects of treatment     Compression Bandaging  foam cut for bandaging; in lower cabinet.               PT Education - 11/22/17 1145    Education Details  Lymphedema, what it is and it's chronic nature.  Treatment for and maintenance for lymphedema.  LE exercises to increase lymphatic flow     Person(s) Educated  Patient    Methods  Explanation;Demonstration;Handout;Verbal cues    Comprehension  Verbalized understanding;Returned demonstration       PT Short Term Goals - 11/22/17 1200      PT SHORT TERM GOAL #1   Title  Pt volume to decrease 2 cm to allow pt to walk without feeling like she is having to drag her right leg.     Time  2    Period  Weeks    Status  New    Target Date  12/06/17      PT SHORT TERM GOAL #2   Title  Pt to verbalize that she is completing her exercises at home to increae lymph circulation to decrease volume.     Time  2    Period  Weeks    Status  New        PT Long Term Goals - 11/22/17 1202      PT LONG TERM GOAL #1   Title  Pt volume to have decreased by 4 cm to reduce risk of celllulitis     Time  4    Period  Weeks    Status  New    Target Date  12/20/17      PT LONG TERM GOAL #2   Title  Pt to be able to don and doff her shoes and socks with greater ease (be able to fit into)     Time  4    Period  Weeks    Status  New      PT LONG TERM GOAL #3   Title  PT to have obtained and be using the compression pump for maintenance aspect of treatment.     Time  4    Period  Weeks    Status  New      PT LONG TERM GOAL #4   Title  Pt to have obtained and be able to don and doff compression garment.    Time  4    Period  Weeks    Status  New             Plan - 11/22/17 1153    Clinical Impression Statement  Megan Walsh states that she has had swelling in her legs since she was 84 years old.  She has been place on diuretics but they do not assist in relieving her swelling.  She was seen by her cardiologist who stated that she had lymphedema and referred her to this clinic.  Evaluation demonstrates heavy legs limiting  walking, increased edema increasing the risk of cellulitis and increased pain.  Megan Walsh will benefit from skilled physical therapy for education, exercise and manual techniques to  decrease pt edema to improve her activity tolerance and decrease her risk of cellulitis.     History and Personal Factors relevant to plan of care:  CHF, ( pt has been hospitalized for fluid overload), we will start with foam and three bandages to see how pt tolerates the extra fluid and gradurally increase the bandages).     Clinical Presentation  Evolving    Clinical Decision Making  Moderate    Rehab Potential  Good    PT Frequency  3x / week    PT Duration  4 weeks    PT Treatment/Interventions  Manual lymph drainage;Manual techniques;Compression bandaging;Patient/family education    PT Next Visit Plan  begin manual lymph drainage followed by compression bandaaging using 1/2 " foam (already cut), and 3 short stretch bandages.     PT Home Exercise Plan  LE exerciss as well as diaphragmic breathing.     Consulted and Agree with Plan of Care  Patient;Family member/caregiver       Patient will benefit from skilled therapeutic intervention in order to improve the following deficits and impairments:  Decreased activity tolerance, Difficulty walking, Pain, Increased edema, Decreased range of motion  Visit Diagnosis: Lymphedema, not elsewhere classified     Problem List Patient Active Problem List   Diagnosis Date Noted  . Tachycardia-bradycardia ([place]) 10/23/2014  . Midsternal chest pain 09/24/2014  . Chest pain 09/22/2014  . CAD (coronary artery disease) 09/22/2014  . Atrial fibrillation with controlled ventricular response ([place]) 09/22/2014  . HTN (hypertension) 09/22/2014  . Diabetes mellitus type 2, insulin dependent ([place]) 09/22/2014  . Pacemaker - MDT ADDR01 Implanted: 05/31/08 Serial# [id number] 09/22/2014  . History of CHF (congestive heart failure) 09/22/2014  . Acute renal failure ([place]) 09/22/2014   . Gout 09/22/2014  . Abnormal EKG    Rayetta Humphrey, [place] CLT [phone] 11/22/2017, 12:05 PM  [place] [facility] [address] [place], [place], [address] Phone: [phone]   Fax:  [phone]  Name: Megan Walsh MRN: [id number] Date of Birth: [birth year]

## 2017-11-23 ENCOUNTER — Telehealth (HOSPITAL_COMMUNITY): Payer: Self-pay | Admitting: Physical Therapy

## 2017-11-23 NOTE — Telephone Encounter (Signed)
7/16  I marked the 1:00 for [employer] lunch since this patient is at 11:15 for 90 min. lymph tx.  MM 3xwk/ 6wks Lymph 90 mins.  L/M with patient's son that we needed to schedule some more apptments with his mom. Holding Thurs [phone]) with Amy. NF7/16/19  Called sister she said call pt's son- l/m again. NF 11/24/17

## 2017-11-24 ENCOUNTER — Ambulatory Visit (HOSPITAL_COMMUNITY): Payer: Medicare Other | Admitting: Physical Therapy

## 2017-11-24 ENCOUNTER — Encounter (HOSPITAL_COMMUNITY): Payer: Self-pay | Admitting: Physical Therapy

## 2017-11-24 DIAGNOSIS — I890 Lymphedema, not elsewhere classified: Secondary | ICD-10-CM | POA: Diagnosis not present

## 2017-11-24 NOTE — Therapy (Addendum)
[place], [place], [address] Phone: [phone]   Fax:  [phone]  Physical Therapy Treatment  Patient Details  Name: Megan Walsh MRN: [id number] Date of Birth: Sep 14, 1932 Referring Provider: Carlyle Dolly    Encounter Date: 11/24/2017  PT End of Session - 11/24/17 1322    Visit Number  2    Number of Visits  12    Date for PT Re-Evaluation  12/22/17    Authorization - Visit Number  2    Authorization - Number of Visits  12    PT Start Time  1115    PT Stop Time  1230    PT Time Calculation (min)  75 min    Activity Tolerance  Patient tolerated treatment well    Behavior During Therapy  [facility] for tasks assessed/performed       Past Medical History:  Diagnosis Date  . Anxiety   . Arthritis   . CHF (congestive heart failure) ([place])   . Coronary artery disease   . Diabetes mellitus without complication ([place])   . Dysrhythmia    a-fib  . Fatty tumor   . History of gout   . Hypertension   . Myocardial infarction ([place])   . Permanent atrial fibrillation ([place])   . Presence of permanent cardiac pacemaker    MDT ADDR01/Implanted: 05/31/08  . Sleep apnea    cannot tolerate CPAP  . Symptomatic bradycardia     Past Surgical History:  Procedure Laterality Date  . ABDOMINAL HYSTERECTOMY    . CATARACT EXTRACTION W/PHACO Left 10/17/2017   Procedure: CATARACT EXTRACTION PHACO AND INTRAOCULAR LENS PLACEMENT LEFT EYE;  Surgeon: Tonny Branch, MD;  Location: [facility];  Service: Ophthalmology;  Laterality: Left;  CDE: 11.64  . CATARACT EXTRACTION W/PHACO Right 10/31/2017   Procedure: CATARACT EXTRACTION PHACO AND INTRAOCULAR LENS PLACEMENT RIGHT EYE;  Surgeon: Tonny Branch, MD;  Location: [facility];  Service: Ophthalmology;  Laterality: Right;  CDE: 11.19  . fatty tumor removal to left arm Right   . PACEMAKER GENERATOR CHANGE  05/31/08   at [facility]: MDT ADDR01/Implanted: 05/31/08 as gen change by Dr Reinaldo Berber  . PACEMAKER  INSERTION  1992    There were no vitals filed for this visit.  Subjective Assessment - 11/24/17 1316    Subjective  Pt states that she forgot to do her exercises.  When therapist questions discoloration and raised area on Lt LE pt states that she ran into a drawer several weeks ago and it just is not going away.     Pertinent History  CHF     How long can you sit comfortably?  no problem     How long can you stand comfortably?  able to stand for five to 10 minutes     How long can you walk comfortably?  Pt states that lately she has been dragging her right leg.   Able to walk 10-15 mintues     Patient Stated Goals  legs to be smaller , fit in her shoes     Pain Onset  More than a month ago                       [facility] Adult PT Treatment/Exercise - 11/24/17 0001      Exercises   Exercises  -     Manual Therapy   Manual Therapy  Manual Lymphatic Drainage (MLD);Compression Bandaging    Manual therapy comments  done seperate from all other aspects of treatment     Manual Lymphatic Drainage (MLD)  supraclavicular, deepa and superfical abdominal followed by routing fluid using inguinal/axillary anastomosis B and anterior aspect of LE     Compression Bandaging  using 1/2 " foam and short stretch bandages.  Due to pt's CHF only 3 bandages used per leg this session.              PT Education - 11/24/17 1319    Education Details  remove if bandages become uncomfortable, The importance of doing her exercises     Person(s) Educated  Patient    Methods  Explanation    Comprehension  Verbalized understanding;Returned demonstration       PT Short Term Goals - 11/24/17 1326      PT SHORT TERM GOAL #1   Title  Pt volume to decrease 2 cm to allow pt to walk without feeling like she is having to drag her right leg.     Time  2    Period  Weeks    Status  On-going      PT SHORT TERM GOAL #2   Title  Pt to verbalize that she is completing her exercises at home to increae  lymph circulation to decrease volume.     Time  2    Period  Weeks    Status  On-going        PT Long Term Goals - 11/24/17 1327      PT LONG TERM GOAL #1   Title  Pt volume to have decreased by 4 cm to reduce risk of celllulitis     Time  4    Period  Weeks    Status  On-going      PT LONG TERM GOAL #2   Title  Pt to be able to don and doff her shoes and socks with greater ease (be able to fit into)     Time  4    Period  Weeks    Status  On-going      PT LONG TERM GOAL #3   Title  PT to have obtained and be using the compression pump for maintenance aspect of treatment.     Time  4    Period  Weeks    Status  On-going      PT LONG TERM GOAL #4   Title  Pt to have obtained and be able to don and doff compression garment.    Time  4    Period  Weeks    Status  On-going            Plan - 11/24/17 1322    Clinical Impression Statement  Evaluation and goals reviewed with patient.  [name] has a discolored area on her distal LE measuring 3.8x 7 cm with a raised area in the center measuring 2x2.8 cm which was caused from her running into a drawer several weeks ago.  We will keep measuring this are to ensure that it is getting smaller.  Noted induration at foot, ankle and distal 1/3 of LE with manual     Rehab Potential  Good    PT Frequency  3x / week    PT Duration  4 weeks    PT Treatment/Interventions  Manual lymph drainage;Manual techniques;Compression bandaging;Patient/family education    PT Next Visit Plan  If patient tolerates 3 bandages well may go to 5.  Assess area on lt LE>  PT Home Exercise Plan  LE exerciss as well as diaphragmic breathing.     Consulted and Agree with Plan of Care  Patient;Family member/caregiver       Patient will benefit from skilled therapeutic intervention in order to improve the following deficits and impairments:  Decreased activity tolerance, Difficulty walking, Pain, Increased edema, Decreased range of motion  Visit  Diagnosis: Lymphedema, not elsewhere classified     Problem List Patient Active Problem List   Diagnosis Date Noted  . Tachycardia-bradycardia ([place]) 10/23/2014  . Midsternal chest pain 09/24/2014  . Chest pain 09/22/2014  . CAD (coronary artery disease) 09/22/2014  . Atrial fibrillation with controlled ventricular response ([place]) 09/22/2014  . HTN (hypertension) 09/22/2014  . Diabetes mellitus type 2, insulin dependent ([place]) 09/22/2014  . Pacemaker - MDT ADDR01 Implanted: 05/31/08 Serial# [id number] 09/22/2014  . History of CHF (congestive heart failure) 09/22/2014  . Acute renal failure ([place]) 09/22/2014  . Gout 09/22/2014  . Abnormal EKG    Rayetta Humphrey, PT CLT [phone] 11/24/2017, 1:27 PM  [facility] [address] [place], [place], [address] Phone: [phone]   Fax:  [phone]  Name: Megan Walsh MRN: [id number] Date of Birth: 04-20-1933

## 2017-11-25 ENCOUNTER — Ambulatory Visit (HOSPITAL_COMMUNITY): Payer: Medicare Other | Admitting: Physical Therapy

## 2017-11-25 ENCOUNTER — Telehealth (HOSPITAL_COMMUNITY): Payer: Self-pay | Admitting: Physical Therapy

## 2017-11-25 NOTE — Telephone Encounter (Signed)
Attempted to call pt about no show but the cell phone box was full therefore unable to leave a message.  Rayetta Humphrey, [place] CLT [phone]

## 2017-11-28 ENCOUNTER — Ambulatory Visit (HOSPITAL_COMMUNITY): Payer: Medicare Other | Admitting: Physical Therapy

## 2017-11-28 ENCOUNTER — Encounter (HOSPITAL_COMMUNITY): Payer: Self-pay | Admitting: Physical Therapy

## 2017-11-28 DIAGNOSIS — I890 Lymphedema, not elsewhere classified: Secondary | ICD-10-CM

## 2017-11-28 DIAGNOSIS — Z299 Encounter for prophylactic measures, unspecified: Secondary | ICD-10-CM | POA: Diagnosis not present

## 2017-11-28 DIAGNOSIS — I10 Essential (primary) hypertension: Secondary | ICD-10-CM | POA: Diagnosis not present

## 2017-11-28 DIAGNOSIS — I4891 Unspecified atrial fibrillation: Secondary | ICD-10-CM | POA: Diagnosis not present

## 2017-11-28 DIAGNOSIS — Z6827 Body mass index (BMI) 27.0-27.9, adult: Secondary | ICD-10-CM | POA: Diagnosis not present

## 2017-11-28 DIAGNOSIS — E1165 Type 2 diabetes mellitus with hyperglycemia: Secondary | ICD-10-CM | POA: Diagnosis not present

## 2017-11-28 NOTE — Therapy (Signed)
[place], [place], [address] Phone: [phone]   Fax:  [phone]  Physical Therapy Treatment  Patient Details  Name: Megan Walsh MRN: [id number] Date of Birth: 1932/12/23 Referring Provider: Carlyle Dolly    Encounter Date: 11/28/2017  PT End of Session - 11/28/17 1215    Visit Number  3    Number of Visits  12    Date for PT Re-Evaluation  12/22/17    Authorization - Visit Number  3    Authorization - Number of Visits  12    PT Start Time  0950    PT Stop Time  1100    PT Time Calculation (min)  70 min    Activity Tolerance  Patient tolerated treatment well    Behavior During Therapy  [facility] for tasks assessed/performed       Past Medical History:  Diagnosis Date  . Anxiety   . Arthritis   . CHF (congestive heart failure) ([place])   . Coronary artery disease   . Diabetes mellitus without complication ([place])   . Dysrhythmia    a-fib  . Fatty tumor   . History of gout   . Hypertension   . Myocardial infarction ([place])   . Permanent atrial fibrillation ([place])   . Presence of permanent cardiac pacemaker    MDT ADDR01/Implanted: 05/31/08  . Sleep apnea    cannot tolerate CPAP  . Symptomatic bradycardia     Past Surgical History:  Procedure Laterality Date  . ABDOMINAL HYSTERECTOMY    . CATARACT EXTRACTION W/PHACO Left 10/17/2017   Procedure: CATARACT EXTRACTION PHACO AND INTRAOCULAR LENS PLACEMENT LEFT EYE;  Surgeon: Tonny Branch, MD;  Location: [facility];  Service: Ophthalmology;  Laterality: Left;  CDE: 11.64  . CATARACT EXTRACTION W/PHACO Right 10/31/2017   Procedure: CATARACT EXTRACTION PHACO AND INTRAOCULAR LENS PLACEMENT RIGHT EYE;  Surgeon: Tonny Branch, MD;  Location: [facility];  Service: Ophthalmology;  Laterality: Right;  CDE: 11.19  . fatty tumor removal to left arm Right   . PACEMAKER GENERATOR CHANGE  05/31/08   at [facility]: MDT ADDR01/Implanted: 05/31/08 as gen change by Dr Reinaldo Berber  . PACEMAKER  INSERTION  1992    There were no vitals filed for this visit.  Subjective Assessment - 11/28/17 1212    Subjective  Pt states that  she did not realize that she had an appointment last Friday.  States that her breathing actually got better after her treatment last week.  She went to the MD this morning and the areal on her left anterior leg is indeeed a blood clot but the MD wants [place] to compress as normal and  is going to wait and see how the pt body responds; hopefully it will clear in several weeks.     Pertinent History  CHF     How long can you sit comfortably?  no problem     How long can you stand comfortably?  able to stand for five to 10 minutes     How long can you walk comfortably?  Pt states that lately she has been dragging her right leg.   Able to walk 10-15 mintues     Patient Stated Goals  legs to be smaller , fit in her shoes     Currently in Pain?  No/denies    Pain Onset  More than a month ago  [facility] Adult PT Treatment/Exercise - 11/28/17 0001      Manual Therapy   Manual Therapy  Manual Lymphatic Drainage (MLD);Compression Bandaging    Manual therapy comments  done seperate from all other aspects of treatment     Manual Lymphatic Drainage (MLD)  supraclavicular, deepa and superfical abdominal followed by routing fluid using inguinal/axillary anastomosis B and anterior aspect of LE     Compression Bandaging  using 1/2 " foam and muti layer short stretch bandages.                PT Short Term Goals - 11/24/17 1326      PT SHORT TERM GOAL #1   Title  Pt volume to decrease 2 cm to allow pt to walk without feeling like she is having to drag her right leg.     Time  2    Period  Weeks    Status  On-going      PT SHORT TERM GOAL #2   Title  Pt to verbalize that she is completing her exercises at home to increae lymph circulation to decrease volume.     Time  2    Period  Weeks    Status  On-going        PT Long Term  Goals - 11/24/17 1327      PT LONG TERM GOAL #1   Title  Pt volume to have decreased by 4 cm to reduce risk of celllulitis     Time  4    Period  Weeks    Status  On-going      PT LONG TERM GOAL #2   Title  Pt to be able to don and doff her shoes and socks with greater ease (be able to fit into)     Time  4    Period  Weeks    Status  On-going      PT LONG TERM GOAL #3   Title  PT to have obtained and be using the compression pump for maintenance aspect of treatment.     Time  4    Period  Weeks    Status  On-going      PT LONG TERM GOAL #4   Title  Pt to have obtained and be able to don and doff compression garment.    Time  4    Period  Weeks    Status  On-going            Plan - 11/28/17 1215    Clinical Impression Statement  Pt had no issues with compression bandaging; actually felt better therefore added last 2 layers of short stretch.  Therapist noted decreased induration from distal LE although induration is still present.     Rehab Potential  Good    PT Frequency  3x / week    PT Duration  4 weeks    PT Treatment/Interventions  Manual lymph drainage;Manual techniques;Compression bandaging;Patient/family education    PT Next Visit Plan  Remeasure next visit. ( Wed)    PT Home Exercise Plan  LE exerciss as well as diaphragmic breathing.     Consulted and Agree with Plan of Care  Patient;Family member/caregiver       Patient will benefit from skilled therapeutic intervention in order to improve the following deficits and impairments:  Decreased activity tolerance, Difficulty walking, Pain, Increased edema, Decreased range of motion  Visit Diagnosis: Lymphedema, not elsewhere classified     Problem List Patient Active  Problem List   Diagnosis Date Noted  . Tachycardia-bradycardia ([place]) 10/23/2014  . Midsternal chest pain 09/24/2014  . Chest pain 09/22/2014  . CAD (coronary artery disease) 09/22/2014  . Atrial fibrillation with controlled ventricular  response ([place]) 09/22/2014  . HTN (hypertension) 09/22/2014  . Diabetes mellitus type 2, insulin dependent ([place]) 09/22/2014  . Pacemaker - MDT ADDR01 Implanted: 05/31/08 Serial# [id number] 09/22/2014  . History of CHF (congestive heart failure) 09/22/2014  . Acute renal failure ([place]) 09/22/2014  . Gout 09/22/2014  . Abnormal EKG     Rayetta Humphrey, [place] CLT [phone] 11/28/2017, 12:18 PM  [place] [address] [place], [place], [address] Phone: [phone]   Fax:  [phone]  Name: LENIX BENOIST MRN: [id number] Date of Birth: [birth year]

## 2017-11-29 ENCOUNTER — Encounter: Payer: Self-pay | Admitting: Cardiology

## 2017-11-29 ENCOUNTER — Ambulatory Visit (INDEPENDENT_AMBULATORY_CARE_PROVIDER_SITE_OTHER): Payer: Medicare Other | Admitting: Cardiology

## 2017-11-29 VITALS — BP 106/67 | HR 84 | Ht 65.0 in | Wt 167.0 lb

## 2017-11-29 DIAGNOSIS — I890 Lymphedema, not elsewhere classified: Secondary | ICD-10-CM

## 2017-11-29 DIAGNOSIS — I5032 Chronic diastolic (congestive) heart failure: Secondary | ICD-10-CM

## 2017-11-29 NOTE — Progress Notes (Signed)
Clinical Summary Megan Walsh is a 84 y.o.female seen today for follow up of the following medical problems. This is a focused visit on history of chronic diastolic HF and lymphedema.   1. Chronic diastolic HF - admit to [facility] 07/2017 with fluid overload -07/2017 echo showed LVEF 55-60%. Diuresed with improved symptoms - readmitted later in 07/2017 with hypotension and colitis.   Discharge weight 147-149 07/11/17.No significant sob/doe  - mild SOB at times. Ongoing LE edema. Taking asix 20mg  bid.    - seen by Dr Rayann Heman 11/11/17 with some fluid overload. Lasix increased to 40mg  bid x 3 days.  - seen by Dr Manuella Ghazi recently, lasix changed to 40mg  in AM and 20 mg pm just yesterday - limiting sodium intake. No SOB/DOE - home weights trending up from 156 to 167 today. She does have heavy leg wraps on at this time due to lymphedema.    2. Lymphedema/LE edema - recently referred to lymphedema clinic     Past Medical History:  Diagnosis Date  . Anxiety   . Arthritis   . CHF (congestive heart failure) ([place])   . Coronary artery disease   . Diabetes mellitus without complication ([place])   . Dysrhythmia    a-fib  . Fatty tumor   . History of gout   . Hypertension   . Myocardial infarction ([place])   . Permanent atrial fibrillation ([place])   . Presence of permanent cardiac pacemaker    MDT ADDR01/Implanted: 05/31/08  . Sleep apnea    cannot tolerate CPAP  . Symptomatic bradycardia      Allergies  Allergen Reactions  . Atorvastatin Swelling and Cough     Current Outpatient Medications  Medication Sig Dispense Refill  . acetaminophen (TYLENOL) 650 MG CR tablet Take 650 mg by mouth 2 (two) times daily as needed for pain.     Marland Kitchen allopurinol (ZYLOPRIM) 300 MG tablet Take 300 mg by mouth daily.    Marland Kitchen amoxicillin (AMOXIL) 500 MG capsule Take 2,000 mg by mouth See admin instructions. Take 2000 mg by mouth 1 hour prior to dental appointment    . ascorbic acid (VITAMIN C) 500 MG  tablet Take 500 mg by mouth daily.    Marland Kitchen docusate sodium (COLACE) 100 MG capsule Take 1 capsule (100 mg total) by mouth every 12 (twelve) hours. (Patient taking differently: Take 100 mg by mouth daily as needed for moderate constipation. ) 20 capsule 0  . DUREZOL 0.05 % EMUL Place 1 drop into the left eye See admin instructions. Begin after surgery. Place 1 drop in left eye 3 times daily and continue as directed  1  . ELIQUIS 5 MG TABS tablet TAKE ONE TABLET BY MOUTH TWICE DAILY 60 tablet 6  . furosemide (LASIX) 20 MG tablet Take 20 mg by mouth 2 (two) times daily.     Marland Kitchen gabapentin (NEURONTIN) 100 MG capsule Take 100 mg by mouth 2 (two) times daily.    Marland Kitchen ketorolac (ACULAR) 0.4 % SOLN Place 1 drop into the left eye See admin instructions. Begin 3 days prior to surgery. Place 1 drop in left eye 4 times daily, 1 drop morning of surgery and continue as directed  1  . lisinopril (PRINIVIL,ZESTRIL) 20 MG tablet Take 1 tablet (20 mg total) by mouth daily. 30 tablet 11  . metoprolol succinate (TOPROL-XL) 25 MG 24 hr tablet TAKE ONE TABLET BY MOUTH EVERY DAY 90 tablet 1  . moxifloxacin (VIGAMOX) 0.5 % ophthalmic solution Place  1 drop into the left eye See admin instructions. Begin 3 days prior to surgery. Place 1 drop in left eye 3 times daily, the morning of surgery and continue for 1 week  1  . potassium chloride SA (K-DUR,KLOR-CON) 20 MEQ tablet Take 20 mEq by mouth daily.    . simvastatin (ZOCOR) 40 MG tablet Take 40 mg by mouth daily.     No current facility-administered medications for this visit.      Past Surgical History:  Procedure Laterality Date  . ABDOMINAL HYSTERECTOMY    . CATARACT EXTRACTION W/PHACO Left 10/17/2017   Procedure: CATARACT EXTRACTION PHACO AND INTRAOCULAR LENS PLACEMENT LEFT EYE;  Surgeon: Tonny , MD;  Location: [facility];  Service: Ophthalmology;  Laterality: Left;  CDE: 11.64  . CATARACT EXTRACTION W/PHACO Right 10/31/2017   Procedure: CATARACT EXTRACTION PHACO AND  INTRAOCULAR LENS PLACEMENT RIGHT EYE;  Surgeon: Tonny , MD;  Location: [facility];  Service: Ophthalmology;  Laterality: Right;  CDE: 11.19  . fatty tumor removal to left arm Right   . PACEMAKER GENERATOR CHANGE  05/31/08   at [facility]: MDT ADDR01/Implanted: 05/31/08 as gen change by Dr Reinaldo Berber  . PACEMAKER INSERTION  1992     Allergies  Allergen Reactions  . Atorvastatin Swelling and Cough      Family History  Problem Relation Age of Onset  . Heart failure Mother      Social History Ms. Gary reports that she quit smoking about 43 years ago. Her smoking use included cigarettes. She started smoking about 73 years ago. She has a 30.00 pack-year smoking history. She has never used smokeless tobacco. Ms. Villaflor reports that she does not drink alcohol.   Review of Systems CONSTITUTIONAL: No weight loss, fever, chills, weakness or fatigue.  HEENT: Eyes: No visual loss, blurred vision, double vision or yellow sclerae.No hearing loss, sneezing, congestion, runny nose or sore throat.  SKIN: No rash or itching.  CARDIOVASCULAR: per hpi RESPIRATORY: No shortness of breath, cough or sputum.  GASTROINTESTINAL: No anorexia, nausea, vomiting or diarrhea. No abdominal pain or blood.  GENITOURINARY: No burning on urination, no polyuria NEUROLOGICAL: No headache, dizziness, syncope, paralysis, ataxia, numbness or tingling in the extremities. No change in bowel or bladder control.  MUSCULOSKELETAL: No muscle, back pain, joint pain or stiffness.  LYMPHATICS: No enlarged nodes. No history of splenectomy.  PSYCHIATRIC: No history of depression or anxiety.  ENDOCRINOLOGIC: No reports of sweating, cold or heat intolerance. No polyuria or polydipsia.  Marland Kitchen   Physical Examination Vitals:   11/29/17 0922  BP: 106/67  Pulse: 84  SpO2: 99%   Vitals:   11/29/17 0922  Weight: 167 lb (75.8 kg)  Height: 5\' 5"  (1.651 m)    Gen: resting comfortably, no acute distress HEENT: no scleral icterus,  pupils equal round and reactive, no palptable cervical adenopathy,  CV: RRR, no m/r/g, no jvd Resp: Clear to auscultation bilaterally GI: abdomen is soft, non-tender, non-distended, normal bowel sounds, no hepatosplenomegaly MSK: extremities are warm, bilateral legs wrapped Skin: warm, no rash Neuro:  no focal deficits Psych: appropriate affect   Diagnostic Studies 09/2014 echo Study Conclusions  - Left ventricle: The cavity size was normal. There was severe asymmetric hypertrophy of the septum. Systolic function was normal. The estimated ejection fraction was in the range of 60% to 65%. Wall motion was normal; there were no regional wall motion abnormalities. - Aortic valve: There was mild regurgitation. - Mitral valve: There was mild regurgitation directed centrally. - Left atrium:  The atrium was moderately to severely dilated. - Tricuspid valve: There was moderate regurgitation. - Pulmonary arteries: Systolic pressure was mildly increased. PA peak pressure: 41 mm Hg (S).  Impressions:  - Appearance is consistent with hypertrophic cardiomyopathy with no outflow tract obstruction (at rest).         Assessment and Plan   1.Chronic diastolic HF/ LE edema/Lymphedema - ongoing swelling, appears more consistent with lymphedema - fluid status difficult to assess given her chronic edema. Recent gain partly due to her wearing heavy leg wraps from lymphedema clinic as well - continue lasix 40mg  in AM and 20mg  in PM. They will update [place] on her weights when her legs are unwrapped at home.         [name], M.D.

## 2017-11-29 NOTE — Patient Instructions (Signed)
Your physician recommends that you schedule a follow-up appointment in: [place]  Your physician recommends that you continue on your current medications as directed. Please refer to the Current Medication list given to you today.  PLEASE UPDATE [place] ON YOUR WEIGHTS ON Friday   Thank you for choosing [facility]!!

## 2017-11-30 ENCOUNTER — Ambulatory Visit (HOSPITAL_COMMUNITY): Payer: Medicare Other | Admitting: Physical Therapy

## 2017-11-30 ENCOUNTER — Encounter (HOSPITAL_COMMUNITY): Payer: Self-pay | Admitting: Physical Therapy

## 2017-11-30 DIAGNOSIS — I890 Lymphedema, not elsewhere classified: Secondary | ICD-10-CM | POA: Diagnosis not present

## 2017-11-30 NOTE — Therapy (Signed)
[facility] [address] [place], [place], [address] Phone: [phone]   Fax:  [phone]  Physical Therapy Treatment  Patient Details  Name: Megan Walsh MRN: [id number] Date of Birth: Dec 07, 1932 Referring Provider: Carlyle Dolly    Encounter Date: 11/30/2017  PT End of Session - 11/30/17 1116    Visit Number  4    Number of Visits  12    Date for PT Re-Evaluation  12/22/17    Authorization - Visit Number  4    Authorization - Number of Visits  12    PT Start Time  1005 Pt 15 minutes late     PT Stop Time  1114    PT Time Calculation (min)  69 min    Activity Tolerance  Patient tolerated treatment well    Behavior During Therapy  [facility] for tasks assessed/performed       Past Medical History:  Diagnosis Date  . Anxiety   . Arthritis   . CHF (congestive heart failure) ([place])   . Coronary artery disease   . Diabetes mellitus without complication ([place])   . Dysrhythmia    a-fib  . Fatty tumor   . History of gout   . Hypertension   . Myocardial infarction ([place])   . Permanent atrial fibrillation ([place])   . Presence of permanent cardiac pacemaker    MDT ADDR01/Implanted: 05/31/08  . Sleep apnea    cannot tolerate CPAP  . Symptomatic bradycardia     Past Surgical History:  Procedure Laterality Date  . ABDOMINAL HYSTERECTOMY    . CATARACT EXTRACTION W/PHACO Left 10/17/2017   Procedure: CATARACT EXTRACTION PHACO AND INTRAOCULAR LENS PLACEMENT LEFT EYE;  Surgeon: Tonny Branch, MD;  Location: [facility];  Service: Ophthalmology;  Laterality: Left;  CDE: 11.64  . CATARACT EXTRACTION W/PHACO Right 10/31/2017   Procedure: CATARACT EXTRACTION PHACO AND INTRAOCULAR LENS PLACEMENT RIGHT EYE;  Surgeon: Tonny Branch, MD;  Location: [facility];  Service: Ophthalmology;  Laterality: Right;  CDE: 11.19  . fatty tumor removal to left arm Right   . PACEMAKER GENERATOR CHANGE  05/31/08   at [facility]: MDT ADDR01/Implanted: 05/31/08 as gen change by Dr  Reinaldo Berber  . PACEMAKER INSERTION  1992    There were no vitals filed for this visit.  Subjective Assessment - 11/30/17 1115    Subjective  Pt states that she can tell that she is able to walk better.  Happy with the reduction of fluid     Pertinent History  CHF     How long can you sit comfortably?  no problem     How long can you stand comfortably?  able to stand for five to 10 minutes     How long can you walk comfortably?  Pt states that lately she has been dragging her right leg.   Able to walk 10-15 mintues     Patient Stated Goals  legs to be smaller , fit in her shoes     Currently in Pain?  No/denies    Pain Onset  More than a month ago            LYMPHEDEMA/ONCOLOGY QUESTIONNAIRE - 11/30/17 1009      Lymphedema Assessments   Lymphedema Assessments  Lower extremities  (Pended)       Right Lower Extremity Lymphedema   20 cm Proximal to Suprapatella  59 cm  (Pended)     10 cm Proximal to Suprapatella  52.8 cm  (Pended)  At Midpatella/Popliteal Crease  43 cm  (Pended)     30 cm Proximal to Floor at Lateral Plantar Foot  39 cm  (Pended)     20 cm Proximal to Floor at Lateral Plantar Foot  32 1  (Pended)     10 cm Proximal to Floor at Lateral Malleoli  29.9 cm  (Pended)     Circumference of ankle/heel  38 cm.  (Pended)     5 cm Proximal to 1st MTP Joint  27.8 cm  (Pended)     Across MTP Joint  26.5 cm  (Pended)     Around Proximal Great Toe  9.5 cm  (Pended)       Left Lower Extremity Lymphedema   20 cm Proximal to Suprapatella  58 cm  (Pended)     10 cm Proximal to Suprapatella  52.5 cm  (Pended)     At Midpatella/Popliteal Crease  43.2 cm  (Pended)     30 cm Proximal to Floor at Lateral Plantar Foot  38.4 cm  (Pended)     20 cm Proximal to Floor at Lateral Plantar Foot  34 cm  (Pended)     10 cm Proximal to Floor at Lateral Malleoli  30.5 cm  (Pended)     Circumference of ankle/heel  39.5 cm.  (Pended)     5 cm Proximal to 1st MTP Joint  27.3 cm  (Pended)      Across MTP Joint  27 cm  (Pended)     Around Proximal Great Toe  10.2 cm  (Pended)                 [facility] Adult PT Treatment/Exercise - 11/30/17 0001      Manual Therapy   Manual Therapy  Manual Lymphatic Drainage (MLD);Compression Bandaging    Manual therapy comments  done seperate from all other aspects of treatment     Manual Lymphatic Drainage (MLD)  supraclavicular, deepa and superfical abdominal followed by routing fluid using inguinal/axillary anastomosis B and anterior aspect of LE     Compression Bandaging  using 1/2 " foam and muti layer short stretch bandages.                PT Short Term Goals - 11/30/17 1119      PT SHORT TERM GOAL #1   Title  Pt volume to decrease 2 cm to allow pt to walk without feeling like she is having to drag her right leg.     Time  2    Period  Weeks    Status  Achieved      PT SHORT TERM GOAL #2   Title  Pt to verbalize that she is completing her exercises at home to increae lymph circulation to decrease volume.     Time  2    Period  Weeks    Status  Achieved        PT Long Term Goals - 11/24/17 1327      PT LONG TERM GOAL #1   Title  Pt volume to have decreased by 4 cm to reduce risk of celllulitis     Time  4    Period  Weeks    Status  On-going      PT LONG TERM GOAL #2   Title  Pt to be able to don and doff her shoes and socks with greater ease (be able to fit into)     Time  4  Period  Weeks    Status  On-going      PT LONG TERM GOAL #3   Title  PT to have obtained and be using the compression pump for maintenance aspect of treatment.     Time  4    Period  Weeks    Status  On-going      PT LONG TERM GOAL #4   Title  Pt to have obtained and be able to don and doff compression garment.    Time  4    Period  Weeks    Status  On-going            Plan - 11/30/17 1116    Clinical Impression Statement  PT remeasured.  Pt has lost volume in B LE but has increased in thigh area; increased time was  spent in manual in thigh and inguinal/axillary anastomisis.   Pt has not heard from [facility] but she has lost her phone.  Therapist will fax Waneta Martins pt new contact number.      Rehab Potential  Good    PT Frequency  3x / week    PT Duration  4 weeks    PT Treatment/Interventions  Manual lymph drainage;Manual techniques;Compression bandaging;Patient/family education    PT Next Visit Plan  Good reduction in LE with increased in thigh area.  May need to consider thigh bandaging.  Will assess next week.     PT Home Exercise Plan  LE exerciss as well as diaphragmic breathing.     Consulted and Agree with Plan of Care  Patient;Family member/caregiver       Patient will benefit from skilled therapeutic intervention in order to improve the following deficits and impairments:  Decreased activity tolerance, Difficulty walking, Pain, Increased edema, Decreased range of motion  Visit Diagnosis: Lymphedema, not elsewhere classified     Problem List Patient Active Problem List   Diagnosis Date Noted  . Tachycardia-bradycardia ([place]) 10/23/2014  . Midsternal chest pain 09/24/2014  . Chest pain 09/22/2014  . CAD (coronary artery disease) 09/22/2014  . Atrial fibrillation with controlled ventricular response ([place]) 09/22/2014  . HTN (hypertension) 09/22/2014  . Diabetes mellitus type 2, insulin dependent ([place]) 09/22/2014  . Pacemaker - MDT ADDR01 Implanted: 05/31/08 Serial# [id number] 09/22/2014  . History of CHF (congestive heart failure) 09/22/2014  . Acute renal failure ([place]) 09/22/2014  . Gout 09/22/2014  . Abnormal EKG     Rayetta Humphrey, [place] CLT [phone] 11/30/2017, 11:20 AM  [facility] [address] [place], [place], [address] Phone: [phone]   Fax:  [phone]  Name: DELLANIRA DILLOW MRN: [id number] Date of Birth: 1932/09/26

## 2017-12-01 ENCOUNTER — Ambulatory Visit (HOSPITAL_COMMUNITY): Payer: Medicare Other | Admitting: Physical Therapy

## 2017-12-01 DIAGNOSIS — I890 Lymphedema, not elsewhere classified: Secondary | ICD-10-CM

## 2017-12-01 NOTE — Therapy (Signed)
[place], [place], [address] Phone: [phone]   Fax:  [phone]  Physical Therapy Treatment  Patient Details  Name: Megan Walsh MRN: [id number] Date of Birth: [birth year] Referring Provider: Carlyle Dolly    Encounter Date: 12/01/2017  PT End of Session - 12/01/17 1723    Visit Number  5    Number of Visits  12    Date for PT Re-Evaluation  12/22/17    Authorization - Visit Number  5    Authorization - Number of Visits  12    PT Start Time  1505    PT Stop Time  1600    PT Time Calculation (min)  55 min    Activity Tolerance  Patient tolerated treatment well    Behavior During Therapy  [facility] for tasks assessed/performed       Past Medical History:  Diagnosis Date  . Anxiety   . Arthritis   . CHF (congestive heart failure) ([place])   . Coronary artery disease   . Diabetes mellitus without complication ([place])   . Dysrhythmia    a-fib  . Fatty tumor   . History of gout   . Hypertension   . Myocardial infarction ([place])   . Permanent atrial fibrillation ([place])   . Presence of permanent cardiac pacemaker    MDT ADDR01/Implanted: 05/31/08  . Sleep apnea    cannot tolerate CPAP  . Symptomatic bradycardia     Past Surgical History:  Procedure Laterality Date  . ABDOMINAL HYSTERECTOMY    . CATARACT EXTRACTION W/PHACO Left 10/17/2017   Procedure: CATARACT EXTRACTION PHACO AND INTRAOCULAR LENS PLACEMENT LEFT EYE;  Surgeon: Tonny Branch, MD;  Location: [facility];  Service: Ophthalmology;  Laterality: Left;  CDE: 11.64  . CATARACT EXTRACTION W/PHACO Right 10/31/2017   Procedure: CATARACT EXTRACTION PHACO AND INTRAOCULAR LENS PLACEMENT RIGHT EYE;  Surgeon: Tonny Branch, MD;  Location: [facility];  Service: Ophthalmology;  Laterality: Right;  CDE: 11.19  . fatty tumor removal to left arm Right   . PACEMAKER GENERATOR CHANGE  05/31/08   at [facility]: MDT ADDR01/Implanted: 05/31/08 as gen change by Dr Reinaldo Berber  . PACEMAKER  INSERTION  1992    There were no vitals filed for this visit.  Subjective Assessment - 12/01/17 1722    Subjective  Pt arrived late secondary to not knowing her appt time had been moved up.  States her legs feel much lighter.    Currently in Pain?  No/denies            LYMPHEDEMA/ONCOLOGY QUESTIONNAIRE - 11/30/17 1009      Lymphedema Assessments   Lymphedema Assessments  Lower extremities  (Pended)       Right Lower Extremity Lymphedema   20 cm Proximal to Suprapatella  59 cm  (Pended)     10 cm Proximal to Suprapatella  52.8 cm  (Pended)     At Midpatella/Popliteal Crease  43 cm  (Pended)     30 cm Proximal to Floor at Lateral Plantar Foot  39 cm  (Pended)     20 cm Proximal to Floor at Lateral Plantar Foot  32 1  (Pended)     10 cm Proximal to Floor at Lateral Malleoli  29.9 cm  (Pended)     Circumference of ankle/heel  38 cm.  (Pended)     5 cm Proximal to 1st MTP Joint  27.8 cm  (Pended)     Across MTP Joint  26.5  cm  (Pended)     Around Proximal Great Toe  9.5 cm  (Pended)       Left Lower Extremity Lymphedema   20 cm Proximal to Suprapatella  58 cm  (Pended)     10 cm Proximal to Suprapatella  52.5 cm  (Pended)     At Midpatella/Popliteal Crease  43.2 cm  (Pended)     30 cm Proximal to Floor at Lateral Plantar Foot  38.4 cm  (Pended)     20 cm Proximal to Floor at Lateral Plantar Foot  34 cm  (Pended)     10 cm Proximal to Floor at Lateral Malleoli  30.5 cm  (Pended)     Circumference of ankle/heel  39.5 cm.  (Pended)     5 cm Proximal to 1st MTP Joint  27.3 cm  (Pended)     Across MTP Joint  27 cm  (Pended)     Around Proximal Great Toe  10.2 cm  (Pended)                 [facility] Adult PT Treatment/Exercise - 12/01/17 1723      Manual Therapy   Manual Therapy  Manual Lymphatic Drainage (MLD);Compression Bandaging    Manual therapy comments  done seperate from all other aspects of treatment     Manual Lymphatic Drainage (MLD)  supraclavicular, deepa and  superfical abdominal followed by routing fluid using inguinal/axillary anastomosis B and anterior aspect of LE     Compression Bandaging  using 1/2 " foam and muti layer short stretch bandages.                PT Short Term Goals - 11/30/17 1119      PT SHORT TERM GOAL #1   Title  Pt volume to decrease 2 cm to allow pt to walk without feeling like she is having to drag her right leg.     Time  2    Period  Weeks    Status  Achieved      PT SHORT TERM GOAL #2   Title  Pt to verbalize that she is completing her exercises at home to increae lymph circulation to decrease volume.     Time  2    Period  Weeks    Status  Achieved        PT Long Term Goals - 11/24/17 1327      PT LONG TERM GOAL #1   Title  Pt volume to have decreased by 4 cm to reduce risk of celllulitis     Time  4    Period  Weeks    Status  On-going      PT LONG TERM GOAL #2   Title  Pt to be able to don and doff her shoes and socks with greater ease (be able to fit into)     Time  4    Period  Weeks    Status  On-going      PT LONG TERM GOAL #3   Title  PT to have obtained and be using the compression pump for maintenance aspect of treatment.     Time  4    Period  Weeks    Status  On-going      PT LONG TERM GOAL #4   Title  Pt to have obtained and be able to don and doff compression garment.    Time  4    Period  Weeks  Status  On-going            Plan - 12/01/17 1724    Clinical Impression Statement  Manual completed anterior only due to time.  No induration noted above ankles bilaterally . Cut approx 1 inch off from each anterior foam pieces due to decreased volume.  Continued with short stretch with comfort reported at end of session.     Rehab Potential  Good    PT Frequency  3x / week    PT Duration  4 weeks    PT Treatment/Interventions  Manual lymph drainage;Manual techniques;Compression bandaging;Patient/family education    PT Next Visit Plan  Assess next week the need for  thigh bandaging.  continue with complete lymph therapy.      PT Home Exercise Plan  LE exerciss as well as diaphragmic breathing.     Consulted and Agree with Plan of Care  Patient;Family member/caregiver       Patient will benefit from skilled therapeutic intervention in order to improve the following deficits and impairments:  Decreased activity tolerance, Difficulty walking, Pain, Increased edema, Decreased range of motion  Visit Diagnosis: Lymphedema, not elsewhere classified     Problem List Patient Active Problem List   Diagnosis Date Noted  . Tachycardia-bradycardia ([place]) 10/23/2014  . Midsternal chest pain 09/24/2014  . Chest pain 09/22/2014  . CAD (coronary artery disease) 09/22/2014  . Atrial fibrillation with controlled ventricular response ([place]) 09/22/2014  . HTN (hypertension) 09/22/2014  . Diabetes mellitus type 2, insulin dependent ([place]) 09/22/2014  . Pacemaker - MDT ADDR01 Implanted: 05/31/08 Serial# [id number] 09/22/2014  . History of CHF (congestive heart failure) 09/22/2014  . Acute renal failure ([place]) 09/22/2014  . Gout 09/22/2014  . Abnormal EKG    Teena Irani, PTA/CLT [phone]  Teena Irani 12/01/2017, 5:26 PM  [address] [place], [place], [address] Phone: [phone]   Fax:  [phone]  Name: Megan Walsh MRN: [id number] Date of Birth: 01-09-1933

## 2017-12-02 ENCOUNTER — Telehealth: Payer: Self-pay

## 2017-12-02 NOTE — Telephone Encounter (Signed)
LMTCB

## 2017-12-02 NOTE — Telephone Encounter (Signed)
Patient called to report weight  7/23 167.0 lbs 7/24 167.4 lbs 7/25 168.8 lbs 7/26 170 (legs wrapped)

## 2017-12-02 NOTE — Telephone Encounter (Signed)
Weight trending up, can she take lasix 40mg  bid today, Sat, Sun and update [place] again on weights Monday   J Leanah Kolander MD

## 2017-12-05 ENCOUNTER — Ambulatory Visit (HOSPITAL_COMMUNITY): Payer: Medicare Other | Admitting: Physical Therapy

## 2017-12-05 DIAGNOSIS — I890 Lymphedema, not elsewhere classified: Secondary | ICD-10-CM

## 2017-12-05 NOTE — Therapy (Signed)
[place], [place], [address] Phone: [phone]   Fax:  [phone]  Physical Therapy Treatment  Patient Details  Name: Megan Walsh MRN: [id number] Date of Birth: 1933/01/17 Referring Provider: Carlyle Dolly    Encounter Date: 12/05/2017  PT End of Session - 12/05/17 1558    Visit Number  6    Number of Visits  12    Date for PT Re-Evaluation  12/22/17    Authorization - Visit Number  6    Authorization - Number of Visits  12    PT Start Time  1440    PT Stop Time  1554    PT Time Calculation (min)  74 min    Activity Tolerance  Patient tolerated treatment well    Behavior During Therapy  [facility] for tasks assessed/performed       Past Medical History:  Diagnosis Date  . Anxiety   . Arthritis   . CHF (congestive heart failure) ([place])   . Coronary artery disease   . Diabetes mellitus without complication ([place])   . Dysrhythmia    a-fib  . Fatty tumor   . History of gout   . Hypertension   . Myocardial infarction ([place])   . Permanent atrial fibrillation ([place])   . Presence of permanent cardiac pacemaker    MDT ADDR01/Implanted: 05/31/08  . Sleep apnea    cannot tolerate CPAP  . Symptomatic bradycardia     Past Surgical History:  Procedure Laterality Date  . ABDOMINAL HYSTERECTOMY    . CATARACT EXTRACTION W/PHACO Left 10/17/2017   Procedure: CATARACT EXTRACTION PHACO AND INTRAOCULAR LENS PLACEMENT LEFT EYE;  Surgeon: Tonny Branch, MD;  Location: [facility];  Service: Ophthalmology;  Laterality: Left;  CDE: 11.64  . CATARACT EXTRACTION W/PHACO Right 10/31/2017   Procedure: CATARACT EXTRACTION PHACO AND INTRAOCULAR LENS PLACEMENT RIGHT EYE;  Surgeon: Tonny Branch, MD;  Location: [facility];  Service: Ophthalmology;  Laterality: Right;  CDE: 11.19  . fatty tumor removal to left arm Right   . PACEMAKER GENERATOR CHANGE  05/31/08   at [facility] [place] [facility]: MDT ADDR01/Implanted: 05/31/08 as gen change by Dr Reinaldo Berber  . PACEMAKER  INSERTION  1992    There were no vitals filed for this visit.  Subjective Assessment - 12/05/17 1441    Subjective  Pt continues to state that her LE feel much better.     Currently in Pain?  No/denies            [facility] Adult PT Treatment/Exercise - 12/05/17 0001      Manual Therapy   Manual Therapy  Manual Lymphatic Drainage (MLD);Compression Bandaging    Manual therapy comments  done seperate from all other aspects of treatment     Manual Lymphatic Drainage (MLD)  supraclavicular, deep and superfical abdominal followed by routing fluid using inguinal/axillary anastomosis B; LE completed  anterior as well as posterior aspect .  Posterior done prone      Compression Bandaging  using 1/2 " foam and muti layer short stretch bandages.                PT Short Term Goals - 11/30/17 1119      PT SHORT TERM GOAL #1   Title  Pt volume to decrease 2 cm to allow pt to walk without feeling like she is having to drag her right leg.     Time  2    Period  Weeks    Status  Achieved      PT SHORT TERM GOAL #2   Title  Pt to verbalize that she is completing her exercises at home to increae lymph circulation to decrease volume.     Time  2    Period  Weeks    Status  Achieved        PT Long Term Goals - 11/24/17 1327      PT LONG TERM GOAL #1   Title  Pt volume to have decreased by 4 cm to reduce risk of celllulitis     Time  4    Period  Weeks    Status  On-going      PT LONG TERM GOAL #2   Title  Pt to be able to don and doff her shoes and socks with greater ease (be able to fit into)     Time  4    Period  Weeks    Status  On-going      PT LONG TERM GOAL #3   Title  PT to have obtained and be using the compression pump for maintenance aspect of treatment.     Time  4    Period  Weeks    Status  On-going      PT LONG TERM GOAL #4   Title  Pt to have obtained and be able to don and doff compression garment.    Time  4    Period  Weeks    Status  On-going             Plan - 12/05/17 1559    Clinical Impression Statement  No palpatable induration in thigh area therefore therapist will not compression bandage thighs at this time.  Pt continues to decongest with manual and compression bandages.      Rehab Potential  Good    PT Frequency  3x / week    PT Duration  4 weeks    PT Treatment/Interventions  Manual lymph drainage;Manual techniques;Compression bandaging;Patient/family education    PT Next Visit Plan    continue with complete lymph therapy; remeasure next visit.    PT Home Exercise Plan  LE exerciss as well as diaphragmic breathing.     Consulted and Agree with Plan of Care  Patient;Family member/caregiver       Patient will benefit from skilled therapeutic intervention in order to improve the following deficits and impairments:  Decreased activity tolerance, Difficulty walking, Pain, Increased edema, Decreased range of motion  Visit Diagnosis: Lymphedema, not elsewhere classified     Problem List Patient Active Problem List   Diagnosis Date Noted  . Tachycardia-bradycardia ([place]) 10/23/2014  . Midsternal chest pain 09/24/2014  . Chest pain 09/22/2014  . CAD (coronary artery disease) 09/22/2014  . Atrial fibrillation with controlled ventricular response ([place]) 09/22/2014  . HTN (hypertension) 09/22/2014  . Diabetes mellitus type 2, insulin dependent ([place]) 09/22/2014  . Pacemaker - MDT ADDR01 Implanted: 05/31/08 Serial# [id number] 09/22/2014  . History of CHF (congestive heart failure) 09/22/2014  . Acute renal failure ([place]) 09/22/2014  . Gout 09/22/2014  . Abnormal EKG     Rayetta Humphrey, [place] CLT [phone] 12/05/2017, 4:01 PM  [facility] [address] [place], [place], [address] Phone: [phone]   Fax:  [phone]  Name: LOYOLA SANTINO MRN: [id number] Date of Birth: Jan 23, 1933

## 2017-12-05 NOTE — Telephone Encounter (Signed)
LMTCB

## 2017-12-06 NOTE — Telephone Encounter (Signed)
Son notified states mother has been doing this. Son does not know weight. Son will try to get in touch with patient to get weight.

## 2017-12-07 ENCOUNTER — Ambulatory Visit (HOSPITAL_COMMUNITY): Payer: Medicare Other | Admitting: Physical Therapy

## 2017-12-07 ENCOUNTER — Encounter (HOSPITAL_COMMUNITY): Payer: Self-pay | Admitting: Physical Therapy

## 2017-12-07 DIAGNOSIS — I890 Lymphedema, not elsewhere classified: Secondary | ICD-10-CM

## 2017-12-07 NOTE — Therapy (Signed)
[place], [place], [address] Phone: [phone]   Fax:  [phone]  Physical Therapy Treatment  Patient Details  Name: Megan Walsh MRN: [id number] Date of Birth: 29-Aug-1932 Referring Provider: Carlyle Dolly    Encounter Date: 12/07/2017  PT End of Session - 12/07/17 1302    Visit Number  7    Number of Visits  12    Date for PT Re-Evaluation  12/22/17    Authorization - Visit Number  7    Authorization - Number of Visits  12    PT Start Time  9381    PT Stop Time  1417    PT Time Calculation (min)  72 min    Activity Tolerance  Patient tolerated treatment well    Behavior During Therapy  [facility] for tasks assessed/performed       Past Medical History:  Diagnosis Date  . Anxiety   . Arthritis   . CHF (congestive heart failure) ([place])   . Coronary artery disease   . Diabetes mellitus without complication ([place])   . Dysrhythmia    a-fib  . Fatty tumor   . History of gout   . Hypertension   . Myocardial infarction ([place])   . Permanent atrial fibrillation ([place])   . Presence of permanent cardiac pacemaker    MDT ADDR01/Implanted: 05/31/08  . Sleep apnea    cannot tolerate CPAP  . Symptomatic bradycardia     Past Surgical History:  Procedure Laterality Date  . ABDOMINAL HYSTERECTOMY    . CATARACT EXTRACTION W/PHACO Left 10/17/2017   Procedure: CATARACT EXTRACTION PHACO AND INTRAOCULAR LENS PLACEMENT LEFT EYE;  Surgeon: Tonny Branch, MD;  Location: [facility];  Service: Ophthalmology;  Laterality: Left;  CDE: 11.64  . CATARACT EXTRACTION W/PHACO Right 10/31/2017   Procedure: CATARACT EXTRACTION PHACO AND INTRAOCULAR LENS PLACEMENT RIGHT EYE;  Surgeon: Tonny Branch, MD;  Location: [facility];  Service: Ophthalmology;  Laterality: Right;  CDE: 11.19  . fatty tumor removal to left arm Right   . PACEMAKER GENERATOR CHANGE  05/31/08   at [facility]: MDT ADDR01/Implanted: 05/31/08 as gen change by Dr Reinaldo Berber  . PACEMAKER  INSERTION  1992    There were no vitals filed for this visit.  Subjective Assessment - 12/07/17 1301    Subjective  Pt continues to do her exercies.  Her legs feel better.     Pertinent History  CHF     How long can you sit comfortably?  no problem     How long can you stand comfortably?  able to stand for five to 10 minutes     How long can you walk comfortably?  Pt states that lately she has been dragging her right leg.   Able to walk 10-15 mintues     Patient Stated Goals  legs to be smaller , fit in her shoes     Currently in Pain?  No/denies    Pain Onset  More than a month ago            LYMPHEDEMA/ONCOLOGY QUESTIONNAIRE - 12/07/17 1302      Lymphedema Assessments   Lymphedema Assessments  Lower extremities  (Pended)       Right Lower Extremity Lymphedema   20 cm Proximal to Suprapatella  55 cm  (Pended)     10 cm Proximal to Suprapatella  51.2 cm  (Pended)     At Midpatella/Popliteal Crease  45.8 cm  (Pended)  30 cm Proximal to Floor at Lateral Plantar Foot  39.8 cm  (Pended)     20 cm Proximal to Floor at Lateral Plantar Foot  30.8 1  (Pended)     10 cm Proximal to Floor at Lateral Malleoli  32.2 cm  (Pended)     Circumference of ankle/heel  38.5 cm.  (Pended)     5 cm Proximal to 1st MTP Joint  28 cm  (Pended)     Across MTP Joint  27.8 cm  (Pended)     Around Proximal Great Toe  10.2 cm  (Pended)       Left Lower Extremity Lymphedema   20 cm Proximal to Suprapatella  54 cm  (Pended)     10 cm Proximal to Suprapatella  51.5 cm  (Pended)     At Midpatella/Popliteal Crease  41.6 cm  (Pended)     30 cm Proximal to Floor at Lateral Plantar Foot  40.5 cm  (Pended)     20 cm Proximal to Floor at Lateral Plantar Foot  35 cm  (Pended)     10 cm Proximal to Floor at Lateral Malleoli  31.8 cm  (Pended)     Circumference of ankle/heel  39.5 cm.  (Pended)     5 cm Proximal to 1st MTP Joint  29.5 cm  (Pended)     Across MTP Joint  27.5 cm  (Pended)     Around Proximal  Great Toe  9.7 cm  (Pended)                 [facility] Adult PT Treatment/Exercise - 12/07/17 0001      Manual Therapy   Manual Therapy  Manual Lymphatic Drainage (MLD);Compression Bandaging    Manual therapy comments  done seperate from all other aspects of treatment     Manual Lymphatic Drainage (MLD)  supraclavicular, deepa and superfical abdominal followed by routing fluid using inguinal/axillary anastomosis B and anterior as well as posterior aspect of LE.  Posterior done prone      Compression Bandaging  using 1/2 " foam and muti layer short stretch bandages.                PT Short Term Goals - 11/30/17 1119      PT SHORT TERM GOAL #1   Title  Pt volume to decrease 2 cm to allow pt to walk without feeling like she is having to drag her right leg.     Time  2    Period  Weeks    Status  Achieved      PT SHORT TERM GOAL #2   Title  Pt to verbalize that she is completing her exercises at home to increae lymph circulation to decrease volume.     Time  2    Period  Weeks    Status  Achieved        PT Long Term Goals - 11/24/17 1327      PT LONG TERM GOAL #1   Title  Pt volume to have decreased by 4 cm to reduce risk of celllulitis     Time  4    Period  Weeks    Status  On-going      PT LONG TERM GOAL #2   Title  Pt to be able to don and doff her shoes and socks with greater ease (be able to fit into)     Time  4    Period  Weeks  Status  On-going      PT LONG TERM GOAL #3   Title  PT to have obtained and be using the compression pump for maintenance aspect of treatment.     Time  4    Period  Weeks    Status  On-going      PT LONG TERM GOAL #4   Title  Pt to have obtained and be able to don and doff compression garment.    Time  4    Period  Weeks    Status  On-going            Plan - 12/07/17 1423    Clinical Impression Statement  Recieved signed order for pump and knee high garments.  Therapist sent order to [facility] cares for pump.  Pt  may need custom garment depending on the total reduction that we achieve.  Pt remeasured today with mixed results.  Pt states that she could tell that her right LE began swelling up as soon as she took her compression bandages off.  Therapist suggested for pt to keep bandages on until she gets her and then her aide can roll them back up as therapist is completing manual.      Rehab Potential  Good    PT Frequency  3x / week    PT Duration  4 weeks    PT Treatment/Interventions  Manual lymph drainage;Manual techniques;Compression bandaging;Patient/family education    PT Next Visit Plan    continue with complete lymph therapy; remeasure on Wednesdays     PT Section as well as diaphragmic breathing.     Consulted and Agree with Plan of Care  Patient;Family member/caregiver       Patient will benefit from skilled therapeutic intervention in order to improve the following deficits and impairments:  Decreased activity tolerance, Difficulty walking, Pain, Increased edema, Decreased range of motion  Visit Diagnosis: Lymphedema, not elsewhere classified     Problem List Patient Active Problem List   Diagnosis Date Noted  . Tachycardia-bradycardia ([place]) 10/23/2014  . Midsternal chest pain 09/24/2014  . Chest pain 09/22/2014  . CAD (coronary artery disease) 09/22/2014  . Atrial fibrillation with controlled ventricular response ([place]) 09/22/2014  . HTN (hypertension) 09/22/2014  . Diabetes mellitus type 2, insulin dependent ([place]) 09/22/2014  . Pacemaker - MDT ADDR01 Implanted: 05/31/08 Serial# [id number] 09/22/2014  . History of CHF (congestive heart failure) 09/22/2014  . Acute renal failure ([place]) 09/22/2014  . Gout 09/22/2014  . Abnormal EKG     Rayetta Humphrey, PT CLT [phone] 12/07/2017, 2:27 PM  [place] [address] [place], [place], [address] Phone: [phone]   Fax:  [phone]  Name: Megan Walsh MRN: [id number] Date of Birth: [birth year]

## 2017-12-08 ENCOUNTER — Other Ambulatory Visit: Payer: Self-pay | Admitting: Cardiology

## 2017-12-09 ENCOUNTER — Other Ambulatory Visit: Payer: Self-pay

## 2017-12-09 ENCOUNTER — Ambulatory Visit (HOSPITAL_COMMUNITY): Payer: Medicare Other | Attending: Cardiology | Admitting: Physical Therapy

## 2017-12-09 DIAGNOSIS — I890 Lymphedema, not elsewhere classified: Secondary | ICD-10-CM | POA: Diagnosis not present

## 2017-12-09 NOTE — Therapy (Signed)
[place], [place], [address] Phone: [phone]   Fax:  [phone]  Physical Therapy Treatment  Patient Details  Name: Megan Walsh MRN: [id number] Date of Birth: 08-18-1932 Referring Provider: Carlyle Dolly    Encounter Date: 12/09/2017  PT End of Session - 12/09/17 1255    Visit Number  8    Number of Visits  12    Date for PT Re-Evaluation  12/22/17    Authorization - Visit Number  8    Authorization - Number of Visits  12    PT Start Time  1300    PT Stop Time  1410    PT Time Calculation (min)  70 min    Activity Tolerance  Patient tolerated treatment well    Behavior During Therapy  [facility] for tasks assessed/performed       Past Medical History:  Diagnosis Date  . Anxiety   . Arthritis   . CHF (congestive heart failure) ([place])   . Coronary artery disease   . Diabetes mellitus without complication ([place])   . Dysrhythmia    a-fib  . Fatty tumor   . History of gout   . Hypertension   . Myocardial infarction ([place])   . Permanent atrial fibrillation ([place])   . Presence of permanent cardiac pacemaker    MDT ADDR01/Implanted: 05/31/08  . Sleep apnea    cannot tolerate CPAP  . Symptomatic bradycardia     Past Surgical History:  Procedure Laterality Date  . ABDOMINAL HYSTERECTOMY    . CATARACT EXTRACTION W/PHACO Left 10/17/2017   Procedure: CATARACT EXTRACTION PHACO AND INTRAOCULAR LENS PLACEMENT LEFT EYE;  Surgeon: Tonny Branch, MD;  Location: [facility];  Service: Ophthalmology;  Laterality: Left;  CDE: 11.64  . CATARACT EXTRACTION W/PHACO Right 10/31/2017   Procedure: CATARACT EXTRACTION PHACO AND INTRAOCULAR LENS PLACEMENT RIGHT EYE;  Surgeon: Tonny Branch, MD;  Location: [facility];  Service: Ophthalmology;  Laterality: Right;  CDE: 11.19  . fatty tumor removal to left arm Right   . PACEMAKER GENERATOR CHANGE  05/31/08   at [facility]: MDT ADDR01/Implanted: 05/31/08 as gen change by Dr Reinaldo Berber  . PACEMAKER  INSERTION  1992    There were no vitals filed for this visit.  Subjective Assessment - 12/09/17 1255    Subjective  Pt has no complaints     Pertinent History  CHF     How long can you sit comfortably?  no problem     How long can you stand comfortably?  able to stand for five to 10 minutes     How long can you walk comfortably?  Pt states that lately she has been dragging her right leg.   Able to walk 10-15 mintues     Patient Stated Goals  legs to be smaller , fit in her shoes     Currently in Pain?  No/denies    Pain Onset  More than a month ago                       [facility] Adult PT Treatment/Exercise - 12/09/17 0001      Manual Therapy   Manual Therapy  Manual Lymphatic Drainage (MLD);Compression Bandaging    Manual therapy comments  done seperate from all other aspects of treatment     Manual Lymphatic Drainage (MLD)  supraclavicular, deepa and superfical abdominal followed by routing fluid using inguinal/axillary anastomosis B and anterior as well as posterior  aspect of LE.  Posterior done prone      Compression Bandaging  using 1/2 " foam and muti layer short stretch bandages.                PT Short Term Goals - 11/30/17 1119      PT SHORT TERM GOAL #1   Title  Pt volume to decrease 2 cm to allow pt to walk without feeling like she is having to drag her right leg.     Time  2    Period  Weeks    Status  Achieved      PT SHORT TERM GOAL #2   Title  Pt to verbalize that she is completing her exercises at home to increae lymph circulation to decrease volume.     Time  2    Period  Weeks    Status  Achieved        PT Long Term Goals - 11/24/17 1327      PT LONG TERM GOAL #1   Title  Pt volume to have decreased by 4 cm to reduce risk of celllulitis     Time  4    Period  Weeks    Status  On-going      PT LONG TERM GOAL #2   Title  Pt to be able to don and doff her shoes and socks with greater ease (be able to fit into)     Time  4     Period  Weeks    Status  On-going      PT LONG TERM GOAL #3   Title  PT to have obtained and be using the compression pump for maintenance aspect of treatment.     Time  4    Period  Weeks    Status  On-going      PT LONG TERM GOAL #4   Title  Pt to have obtained and be able to don and doff compression garment.    Time  4    Period  Weeks    Status  On-going            Plan - 12/09/17 1419    Clinical Impression Statement  Pt has no noted induration in B LE.  Pt notes increased ease of ambulation now that volume of LE has decreased.  Will remeasure on Wed. if volumes have not decreased will progress to compression garment.    Rehab Potential  Good    PT Frequency  3x / week    PT Duration  4 weeks    PT Treatment/Interventions  Manual lymph drainage;Manual techniques;Compression bandaging;Patient/family education    PT Next Visit Plan    continue with complete lymph therapy; remeasure on Wednesdays . Show pt juxtafit vs. garment to see which pt and aide feels would be easiest for pt.     PT Home Exercise Plan  LE exerciss as well as diaphragmic breathing.     Consulted and Agree with Plan of Care  Patient;Family member/caregiver       Patient will benefit from skilled therapeutic intervention in order to improve the following deficits and impairments:  Decreased activity tolerance, Difficulty walking, Pain, Increased edema, Decreased range of motion  Visit Diagnosis: Lymphedema, not elsewhere classified     Problem List Patient Active Problem List   Diagnosis Date Noted  . Tachycardia-bradycardia ([place]) 10/23/2014  . Midsternal chest pain 09/24/2014  . Chest pain 09/22/2014  . CAD (coronary artery disease)  09/22/2014  . Atrial fibrillation with controlled ventricular response ([place]) 09/22/2014  . HTN (hypertension) 09/22/2014  . Diabetes mellitus type 2, insulin dependent ([place]) 09/22/2014  . Pacemaker - MDT ADDR01 Implanted: 05/31/08 Serial# [id number] 09/22/2014  .  History of CHF (congestive heart failure) 09/22/2014  . Acute renal failure ([place]) 09/22/2014  . Gout 09/22/2014  . Abnormal EKG    Rayetta Humphrey, PT CLT [phone] 12/09/2017, 2:22 PM  [facility] [address] [place], [place], [address] Phone: [phone]   Fax:  [phone]  Name: Megan Walsh MRN: [id number] Date of Birth: Sep 17, 1932

## 2017-12-12 ENCOUNTER — Ambulatory Visit (HOSPITAL_COMMUNITY): Payer: Medicare Other | Admitting: Physical Therapy

## 2017-12-12 DIAGNOSIS — I890 Lymphedema, not elsewhere classified: Secondary | ICD-10-CM

## 2017-12-12 NOTE — Therapy (Signed)
[place], [place], [address] Phone: [phone]   Fax:  [phone]  Physical Therapy Treatment  Patient Details  Name: BARI LEIB MRN: [id number] Date of Birth: 1932/08/30 Referring Provider: Carlyle Dolly    Encounter Date: 12/12/2017  PT End of Session - 12/12/17 1434    Visit Number  9    Number of Visits  12    Date for PT Re-Evaluation  12/22/17    Authorization - Visit Number  9    Authorization - Number of Visits  12    PT Start Time  1310    PT Stop Time  1406    PT Time Calculation (min)  56 min    Activity Tolerance  Patient tolerated treatment well    Behavior During Therapy  [facility] for tasks assessed/performed       Past Medical History:  Diagnosis Date  . Anxiety   . Arthritis   . CHF (congestive heart failure) ([place])   . Coronary artery disease   . Diabetes mellitus without complication ([place])   . Dysrhythmia    a-fib  . Fatty tumor   . History of gout   . Hypertension   . Myocardial infarction ([place])   . Permanent atrial fibrillation ([place])   . Presence of permanent cardiac pacemaker    MDT ADDR01/Implanted: 05/31/08  . Sleep apnea    cannot tolerate CPAP  . Symptomatic bradycardia     Past Surgical History:  Procedure Laterality Date  . ABDOMINAL HYSTERECTOMY    . CATARACT EXTRACTION W/PHACO Left 10/17/2017   Procedure: CATARACT EXTRACTION PHACO AND INTRAOCULAR LENS PLACEMENT LEFT EYE;  Surgeon: Tonny Branch, MD;  Location: [facility];  Service: Ophthalmology;  Laterality: Left;  CDE: 11.64  . CATARACT EXTRACTION W/PHACO Right 10/31/2017   Procedure: CATARACT EXTRACTION PHACO AND INTRAOCULAR LENS PLACEMENT RIGHT EYE;  Surgeon: Tonny Branch, MD;  Location: [facility];  Service: Ophthalmology;  Laterality: Right;  CDE: 11.19  . fatty tumor removal to left arm Right   . PACEMAKER GENERATOR CHANGE  05/31/08   at [facility]: MDT ADDR01/Implanted: 05/31/08 as gen change by Dr Reinaldo Berber  . PACEMAKER  INSERTION  1992    There were no vitals filed for this visit.  Subjective Assessment - 12/12/17 1428    Subjective  Pt reports no issues currently.  States her legs feel much better and lighter.    Currently in Pain?  No/denies                       [facility] Adult PT Treatment/Exercise - 12/12/17 0001      Manual Therapy   Manual Therapy  Manual Lymphatic Drainage (MLD);Compression Bandaging    Manual therapy comments  done seperate from all other aspects of treatment     Manual Lymphatic Drainage (MLD)  supraclavicular, deepa and superfical abdominal followed by routing fluid using inguinal/axillary anastomosis B and anterior as well as posterior aspect of LE.  Posterior done prone      Compression Bandaging  using 1/2 " foam and muti layer short stretch bandages.                PT Short Term Goals - 11/30/17 1119      PT SHORT TERM GOAL #1   Title  Pt volume to decrease 2 cm to allow pt to walk without feeling like she is having to drag her right leg.     Time  2    Period  Weeks    Status  Achieved      PT SHORT TERM GOAL #2   Title  Pt to verbalize that she is completing her exercises at home to increae lymph circulation to decrease volume.     Time  2    Period  Weeks    Status  Achieved        PT Long Term Goals - 11/24/17 1327      PT LONG TERM GOAL #1   Title  Pt volume to have decreased by 4 cm to reduce risk of celllulitis     Time  4    Period  Weeks    Status  On-going      PT LONG TERM GOAL #2   Title  Pt to be able to don and doff her shoes and socks with greater ease (be able to fit into)     Time  4    Period  Weeks    Status  On-going      PT LONG TERM GOAL #3   Title  PT to have obtained and be using the compression pump for maintenance aspect of treatment.     Time  4    Period  Weeks    Status  On-going      PT LONG TERM GOAL #4   Title  Pt to have obtained and be able to don and doff compression garment.    Time  4     Period  Weeks    Status  On-going            Plan - 12/12/17 1435    Clinical Impression Statement  Overall reduction in induration though mostly congested in toes and forefoot.  Manual completed antieror and posterior followed by compression bandaging to knee.  To be measured next session and will obtain compression garment if volumes remain unchanged.    Rehab Potential  Good    PT Frequency  3x / week    PT Duration  4 weeks    PT Treatment/Interventions  Manual lymph drainage;Manual techniques;Compression bandaging;Patient/family education    PT Next Visit Plan    continue with complete lymph therapy; remeasure on Wednesdays . Show pt juxtafit vs. garment to see which pt and aide feels would be easiest for pt.     PT Home Exercise Plan  LE exerciss as well as diaphragmic breathing.     Consulted and Agree with Plan of Care  Patient;Family member/caregiver       Patient will benefit from skilled therapeutic intervention in order to improve the following deficits and impairments:  Decreased activity tolerance, Difficulty walking, Pain, Increased edema, Decreased range of motion  Visit Diagnosis: Lymphedema, not elsewhere classified     Problem List Patient Active Problem List   Diagnosis Date Noted  . Tachycardia-bradycardia ([place]) 10/23/2014  . Midsternal chest pain 09/24/2014  . Chest pain 09/22/2014  . CAD (coronary artery disease) 09/22/2014  . Atrial fibrillation with controlled ventricular response ([place]) 09/22/2014  . HTN (hypertension) 09/22/2014  . Diabetes mellitus type 2, insulin dependent ([place]) 09/22/2014  . Pacemaker - MDT ADDR01 Implanted: 05/31/08 Serial# [id number] 09/22/2014  . History of CHF (congestive heart failure) 09/22/2014  . Acute renal failure ([place]) 09/22/2014  . Gout 09/22/2014  . Abnormal EKG     Teena Irani, PTA/CLT [phone]  Roseanne Reno B 12/12/2017, 2:37 PM  [facility] [address]  [place], [place], [address] Phone: [phone]   Fax:  [phone]  Name: SHAQUERA ANSLEY MRN: [id number] Date of Birth: Jun 10, 1932

## 2017-12-14 ENCOUNTER — Ambulatory Visit (HOSPITAL_COMMUNITY): Payer: Medicare Other | Admitting: Physical Therapy

## 2017-12-14 ENCOUNTER — Encounter (HOSPITAL_COMMUNITY): Payer: Self-pay | Admitting: Physical Therapy

## 2017-12-14 ENCOUNTER — Other Ambulatory Visit: Payer: Self-pay

## 2017-12-14 DIAGNOSIS — I890 Lymphedema, not elsewhere classified: Secondary | ICD-10-CM

## 2017-12-14 NOTE — Therapy (Signed)
[facility] [address] [place], [place], [address] Phone: [phone]   Fax:  [phone]  Physical Therapy Treatment  Patient Details  Name: Megan Walsh MRN: [id number] Date of Birth: January 13, 1933 Referring Provider: Carlyle Dolly   Progress Note Reporting Period 11/22/2017 to 12/14/2017  See note below for Objective Data and Assessment of Progress/Goals.     Encounter Date: 12/14/2017  PT End of Session - 12/14/17 1427    Visit Number  10    Number of Visits  18    Date for PT Re-Evaluation  12/22/17    Authorization - Visit Number  10    Authorization - Number of Visits  18    PT Start Time  1300    PT Stop Time  1415    PT Time Calculation (min)  75 min    Activity Tolerance  Patient tolerated treatment well    Behavior During Therapy  [facility] for tasks assessed/performed       Past Medical History:  Diagnosis Date  . Anxiety   . Arthritis   . CHF (congestive heart failure) ([place])   . Coronary artery disease   . Diabetes mellitus without complication ([place])   . Dysrhythmia    a-fib  . Fatty tumor   . History of gout   . Hypertension   . Myocardial infarction ([place])   . Permanent atrial fibrillation ([place])   . Presence of permanent cardiac pacemaker    MDT ADDR01/Implanted: 05/31/08  . Sleep apnea    cannot tolerate CPAP  . Symptomatic bradycardia     Past Surgical History:  Procedure Laterality Date  . ABDOMINAL HYSTERECTOMY    . CATARACT EXTRACTION W/PHACO Left 10/17/2017   Procedure: CATARACT EXTRACTION PHACO AND INTRAOCULAR LENS PLACEMENT LEFT EYE;  Surgeon: Tonny Branch, MD;  Location: [facility];  Service: Ophthalmology;  Laterality: Left;  CDE: 11.64  . CATARACT EXTRACTION W/PHACO Right 10/31/2017   Procedure: CATARACT EXTRACTION PHACO AND INTRAOCULAR LENS PLACEMENT RIGHT EYE;  Surgeon: Tonny Branch, MD;  Location: [facility];  Service: Ophthalmology;  Laterality: Right;  CDE: 11.19  . fatty tumor removal to left arm  Right   . PACEMAKER GENERATOR CHANGE  05/31/08   at [facility]: MDT ADDR01/Implanted: 05/31/08 as gen change by Dr Reinaldo Berber  . PACEMAKER INSERTION  1992    There were no vitals filed for this visit.  Subjective Assessment - 12/14/17 1302    Subjective  Pt states that she is doing well;states that everything she does is easier since she started treatment.     Pertinent History  CHF     How long can you sit comfortably?  no problem     How long can you stand comfortably?  able to stand for five to 10 minutes     How long can you walk comfortably?  Pt states that lately she has been dragging her right leg.   Able to walk 10-15 mintues     Patient Stated Goals  legs to be smaller , fit in her shoes     Currently in Pain?  No/denies    Pain Onset  More than a month ago            LYMPHEDEMA/ONCOLOGY QUESTIONNAIRE - 12/14/17 1303      Right Lower Extremity Lymphedema   20 cm Proximal to Suprapatella  54.5 cm last week 55 inital eval 54.5    10 cm Proximal to Suprapatella  49 cm 51.2 inital eval  47.5    At Midpatella/Popliteal Crease  45.5 cm 45.8; inital eval 46    30 cm Proximal to Floor at Lateral Plantar Foot  38 cm 39.8; inital eval 42.5    20 cm Proximal to Floor at Lateral Plantar Foot  32.3 1 last week 30.8; inital eval 38,5    10 cm Proximal to Floor at Lateral Malleoli  30 cm 32.2; inital eval 37.5    Circumference of ankle/heel  38.5 cm. 38.5; inital eval 40.7    5 cm Proximal to 1st MTP Joint  26.7 cm 28 inital eval 28.3    Across MTP Joint  26.8 cm 27.8; inital eval 27.7    Around Proximal Great Toe  10 cm 10.2; initial eval 10       Left Lower Extremity Lymphedema   20 cm Proximal to Suprapatella  54.5 cm last week was 54; initial eval 46.7    10 cm Proximal to Suprapatella  48.8 cm 51.4; eval 43.5    At Midpatella/Popliteal Crease  45.5 cm 41.6 eval 42.3    30 cm Proximal to Floor at Lateral Plantar Foot  38.7 cm 40.7 eval 38.5    20 cm Proximal to Floor at Lateral  Plantar Foot  32.5 cm 35; eval 33.8    10 cm Proximal to Floor at Lateral Malleoli  30 cm 31.8; eval 41    Circumference of ankle/heel  39 cm. 39.5 eval 39.2    5 cm Proximal to 1st MTP Joint  27.2 cm 29.5; eval 27.6    Across MTP Joint  27 cm 27.5; eval 27.6    Around Proximal Great Toe  9.5 cm 9.7 was 10.4                 [facility] Adult PT Treatment/Exercise - 12/14/17 0001      Manual Therapy   Manual Therapy  Manual Lymphatic Drainage (MLD);Compression Bandaging    Manual therapy comments  done seperate from all other aspects of treatment     Manual Lymphatic Drainage (MLD)  supraclavicular, deepa and superfical abdominal followed by routing fluid using inguinal/axillary anastomosis B and anterior as well as posterior aspect of LE.  Posterior done prone      Compression Bandaging  using 1/2 " foam and muti layer short stretch bandages.                PT Short Term Goals - 11/30/17 1119      PT SHORT TERM GOAL #1   Title  Pt volume to decrease 2 cm to allow pt to walk without feeling like she is having to drag her right leg.     Time  2    Period  Weeks    Status  Achieved      PT SHORT TERM GOAL #2   Title  Pt to verbalize that she is completing her exercises at home to increae lymph circulation to decrease volume.     Time  2    Period  Weeks    Status  Achieved        PT Long Term Goals - 11/24/17 1327      PT LONG TERM GOAL #1   Title  Pt volume to have decreased by 4 cm to reduce risk of celllulitis     Time  4    Period  Weeks    Status  On-going      PT LONG TERM GOAL #2   Title  Pt  to be able to don and doff her shoes and socks with greater ease (be able to fit into)     Time  4    Period  Weeks    Status  On-going      PT LONG TERM GOAL #3   Title  PT to have obtained and be using the compression pump for maintenance aspect of treatment.     Time  4    Period  Weeks    Status  On-going      PT LONG TERM GOAL #4   Title  Pt to have  obtained and be able to don and doff compression garment.    Time  4    Period  Weeks    Status  On-going            Plan - 12/14/17 1428    Clinical Impression Statement  Pt remeasured today with continue decongestion of LE.  Pt will continue to benefit from skilled therapy to reduce volumes of LE to maximal level prior to having pt don compression garment.     Rehab Potential  Good    PT Frequency  3x / week    PT Duration  6 weeks    PT Treatment/Interventions  Manual lymph drainage;Manual techniques;Compression bandaging;Patient/family education    PT Next Visit Plan    continue with complete lymph therapy; remeasure on Wednesdays . Show pt juxtafit vs. garment to see which pt and aide feels would be easiest for pt.     PT Home Exercise Plan  LE exerciss as well as diaphragmic breathing.     Consulted and Agree with Plan of Care  Patient;Family member/caregiver       Patient will benefit from skilled therapeutic intervention in order to improve the following deficits and impairments:  Decreased activity tolerance, Difficulty walking, Pain, Increased edema, Decreased range of motion  Visit Diagnosis: Lymphedema, not elsewhere classified     Problem List Patient Active Problem List   Diagnosis Date Noted  . Tachycardia-bradycardia ([place]) 10/23/2014  . Midsternal chest pain 09/24/2014  . Chest pain 09/22/2014  . CAD (coronary artery disease) 09/22/2014  . Atrial fibrillation with controlled ventricular response ([place]) 09/22/2014  . HTN (hypertension) 09/22/2014  . Diabetes mellitus type 2, insulin dependent ([place]) 09/22/2014  . Pacemaker - MDT ADDR01 Implanted: 05/31/08 Serial# [id number] 09/22/2014  . History of CHF (congestive heart failure) 09/22/2014  . Acute renal failure ([place]) 09/22/2014  . Gout 09/22/2014  . Abnormal EKG     Rayetta Humphrey, [place] CLT [phone] 12/14/2017, 2:39 PM  [place], [place], [address] Phone: [phone]   Fax:  [phone]  Name: Megan Walsh MRN: [id number] Date of Birth: 1933-01-12

## 2017-12-16 ENCOUNTER — Telehealth (HOSPITAL_COMMUNITY): Payer: Self-pay | Admitting: Internal Medicine

## 2017-12-16 ENCOUNTER — Ambulatory Visit (HOSPITAL_COMMUNITY): Payer: Medicare Other | Admitting: Physical Therapy

## 2017-12-16 NOTE — Telephone Encounter (Signed)
12/16/17  caregiver cx appt because she is having car trouble

## 2017-12-19 ENCOUNTER — Ambulatory Visit (HOSPITAL_COMMUNITY): Payer: Medicare Other | Admitting: Physical Therapy

## 2017-12-19 ENCOUNTER — Telehealth (HOSPITAL_COMMUNITY): Payer: Self-pay | Admitting: Physical Therapy

## 2017-12-19 DIAGNOSIS — I890 Lymphedema, not elsewhere classified: Secondary | ICD-10-CM

## 2017-12-19 NOTE — Therapy (Signed)
Megan Walsh [place], [place], [address] Phone: [phone]   Fax:  [phone]  Physical Therapy Treatment  Patient Details  Name: Megan Walsh MRN: [id number] Date of Birth: 10-11-1932 Referring Provider: Carlyle Dolly    Encounter Date: 12/19/2017  PT End of Session - 12/19/17 1538    Visit Number  11    Number of Visits  18    Date for PT Re-Evaluation  12/22/17    Authorization - Visit Number  11    Authorization - Number of Visits  18    PT Start Time  3903    PT Stop Time  1421    PT Time Calculation (min)  73 min    Activity Tolerance  Patient tolerated treatment well    Behavior During Therapy  [facility] for tasks assessed/performed       Past Medical History:  Diagnosis Date  . Anxiety   . Arthritis   . CHF (congestive heart failure) ([place])   . Coronary artery disease   . Diabetes mellitus without complication ([place])   . Dysrhythmia    a-fib  . Fatty tumor   . History of gout   . Hypertension   . Myocardial infarction ([place])   . Permanent atrial fibrillation ([place])   . Presence of permanent cardiac pacemaker    MDT ADDR01/Implanted: 05/31/08  . Sleep apnea    cannot tolerate CPAP  . Symptomatic bradycardia     Past Surgical History:  Procedure Laterality Date  . ABDOMINAL HYSTERECTOMY    . CATARACT EXTRACTION W/PHACO Left 10/17/2017   Procedure: CATARACT EXTRACTION PHACO AND INTRAOCULAR LENS PLACEMENT LEFT EYE;  Surgeon: Tonny Branch, MD;  Location: [facility];  Service: Ophthalmology;  Laterality: Left;  CDE: 11.64  . CATARACT EXTRACTION W/PHACO Right 10/31/2017   Procedure: CATARACT EXTRACTION PHACO AND INTRAOCULAR LENS PLACEMENT RIGHT EYE;  Surgeon: Tonny Branch, MD;  Location: [facility];  Service: Ophthalmology;  Laterality: Right;  CDE: 11.19  . fatty tumor removal to left arm Right   . PACEMAKER GENERATOR CHANGE  05/31/08   at [facility]: MDT ADDR01/Implanted: 05/31/08 as gen change by Dr Reinaldo Berber  . PACEMAKER  INSERTION  1992    There were no vitals filed for this visit.  Subjective Assessment - 12/19/17 1537    Subjective  Pt reports she is doing well.  Still has not received her pump.     Currently in Pain?  No/denies                       [facility] Adult PT Treatment/Exercise - 12/19/17 0001      Manual Therapy   Manual Therapy  Manual Lymphatic Drainage (MLD);Compression Bandaging    Manual therapy comments  done seperate from all other aspects of treatment     Manual Lymphatic Drainage (MLD)  supraclavicular, deepa and superfical abdominal followed by routing fluid using inguinal/axillary anastomosis B and anterior as well as posterior aspect of LE.  Posterior done prone      Compression Bandaging  using 1/2 " foam and muti layer short stretch bandages.                PT Short Term Goals - 11/30/17 1119      PT SHORT TERM GOAL #1   Title  Pt volume to decrease 2 cm to allow pt to walk without feeling like she is having to drag her right leg.     Time  2    Period  Weeks    Status  Achieved      PT SHORT TERM GOAL #2   Title  Pt to verbalize that she is completing her exercises at home to increae lymph circulation to decrease volume.     Time  2    Period  Weeks    Status  Achieved        PT Long Term Goals - 11/24/17 1327      PT LONG TERM GOAL #1   Title  Pt volume to have decreased by 4 cm to reduce risk of celllulitis     Time  4    Period  Weeks    Status  On-going      PT LONG TERM GOAL #2   Title  Pt to be able to don and doff her shoes and socks with greater ease (be able to fit into)     Time  4    Period  Weeks    Status  On-going      PT LONG TERM GOAL #3   Title  PT to have obtained and be using the compression pump for maintenance aspect of treatment.     Time  4    Period  Weeks    Status  On-going      PT LONG TERM GOAL #4   Title  Pt to have obtained and be able to don and doff compression garment.    Time  4    Period   Weeks    Status  On-going            Plan - 12/19/17 1539    Clinical Impression Statement  Manual lymph draiange completed following by compression bandaging.  Email sent to [facility] regarding pump.     Rehab Potential  Good    PT Frequency  3x / week    PT Duration  6 weeks    PT Treatment/Interventions  Manual lymph drainage;Manual techniques;Compression bandaging;Patient/family education    PT Next Visit Plan    continue with complete lymph therapy; remeasure on Wednesdays . Show pt juxtafit vs. garment to see which pt and aide feels would be easiest for pt.  Follow up on pump.    PT Home Exercise Plan  LE exerciss as well as diaphragmic breathing.     Consulted and Agree with Plan of Care  Patient;Family member/caregiver       Patient will benefit from skilled therapeutic intervention in order to improve the following deficits and impairments:  Decreased activity tolerance, Difficulty walking, Pain, Increased edema, Decreased range of motion  Visit Diagnosis: Lymphedema, not elsewhere classified     Problem List Patient Active Problem List   Diagnosis Date Noted  . Tachycardia-bradycardia ([place]) 10/23/2014  . Midsternal chest pain 09/24/2014  . Chest pain 09/22/2014  . CAD (coronary artery disease) 09/22/2014  . Atrial fibrillation with controlled ventricular response ([place]) 09/22/2014  . HTN (hypertension) 09/22/2014  . Diabetes mellitus type 2, insulin dependent ([place]) 09/22/2014  . Pacemaker - MDT ADDR01 Implanted: 05/31/08 Serial# [id number] 09/22/2014  . History of CHF (congestive heart failure) 09/22/2014  . Acute renal failure ([place]) 09/22/2014  . Gout 09/22/2014  . Abnormal EKG    Teena Irani, PTA/CLT [phone]  Teena Irani 12/19/2017, 3:44 PM  [place] [address] [place], [place], [address] Phone: [phone]   Fax:  [phone]  Name: Megan Walsh MRN: [id number]  Date of Birth:  [birth year]

## 2017-12-19 NOTE — Telephone Encounter (Signed)
Order for compression pump faxed to [place], PTA/CLT [phone]

## 2017-12-20 ENCOUNTER — Telehealth (HOSPITAL_COMMUNITY): Payer: Self-pay | Admitting: Physical Therapy

## 2017-12-20 NOTE — Telephone Encounter (Signed)
Message from Network engineer to return call from [place] at [facility].  Requesting additional treatment notes be faxed over for pump.  The past 2 treatment notes faxed Teena Irani, PTA/CLT [phone]

## 2017-12-21 ENCOUNTER — Ambulatory Visit (HOSPITAL_COMMUNITY): Payer: Medicare Other | Admitting: Physical Therapy

## 2017-12-21 ENCOUNTER — Telehealth (HOSPITAL_COMMUNITY): Payer: Self-pay | Admitting: Physical Therapy

## 2017-12-21 DIAGNOSIS — I10 Essential (primary) hypertension: Secondary | ICD-10-CM | POA: Diagnosis not present

## 2017-12-21 DIAGNOSIS — E1165 Type 2 diabetes mellitus with hyperglycemia: Secondary | ICD-10-CM | POA: Diagnosis not present

## 2017-12-21 DIAGNOSIS — Z299 Encounter for prophylactic measures, unspecified: Secondary | ICD-10-CM | POA: Diagnosis not present

## 2017-12-21 DIAGNOSIS — I4891 Unspecified atrial fibrillation: Secondary | ICD-10-CM | POA: Diagnosis not present

## 2017-12-21 DIAGNOSIS — I890 Lymphedema, not elsewhere classified: Secondary | ICD-10-CM

## 2017-12-21 DIAGNOSIS — Z6827 Body mass index (BMI) 27.0-27.9, adult: Secondary | ICD-10-CM | POA: Diagnosis not present

## 2017-12-21 NOTE — Therapy (Signed)
[place], [place], [address] Phone: [phone]   Fax:  [phone]  Physical Therapy Treatment  Patient Details  Name: Megan Walsh MRN: [id number] Date of Birth: 12/29/1932 Referring Provider: Carlyle Dolly    Encounter Date: 12/21/2017  PT End of Session - 12/21/17 1108    Visit Number  12    Number of Visits  18    Date for PT Re-Evaluation  01/13/18    Authorization Type  cert 6/2-3/7    Authorization - Visit Number  12    Authorization - Number of Visits  18    PT Start Time  0950    PT Stop Time  1100    PT Time Calculation (min)  70 min    Activity Tolerance  Patient tolerated treatment well    Behavior During Therapy  [facility] for tasks assessed/performed       Past Medical History:  Diagnosis Date  . Anxiety   . Arthritis   . CHF (congestive heart failure) ([place])   . Coronary artery disease   . Diabetes mellitus without complication ([place])   . Dysrhythmia    a-fib  . Fatty tumor   . History of gout   . Hypertension   . Myocardial infarction ([place])   . Permanent atrial fibrillation ([place])   . Presence of permanent cardiac pacemaker    MDT ADDR01/Implanted: 05/31/08  . Sleep apnea    cannot tolerate CPAP  . Symptomatic bradycardia     Past Surgical History:  Procedure Laterality Date  . ABDOMINAL HYSTERECTOMY    . CATARACT EXTRACTION W/PHACO Left 10/17/2017   Procedure: CATARACT EXTRACTION PHACO AND INTRAOCULAR LENS PLACEMENT LEFT EYE;  Surgeon: Tonny Branch, MD;  Location: [facility];  Service: Ophthalmology;  Laterality: Left;  CDE: 11.64  . CATARACT EXTRACTION W/PHACO Right 10/31/2017   Procedure: CATARACT EXTRACTION PHACO AND INTRAOCULAR LENS PLACEMENT RIGHT EYE;  Surgeon: Tonny Branch, MD;  Location: [facility];  Service: Ophthalmology;  Laterality: Right;  CDE: 11.19  . fatty tumor removal to left arm Right   . PACEMAKER GENERATOR CHANGE  05/31/08   at [facility]: MDT ADDR01/Implanted: 05/31/08 as gen  change by Dr Reinaldo Berber  . PACEMAKER INSERTION  1992    There were no vitals filed for this visit.  Subjective Assessment - 12/21/17 1105    Subjective  pt comes today without aide present.  Spoke with her regarding garments and she wishes to have regular knee high stockings    Currently in Pain?  No/denies            LYMPHEDEMA/ONCOLOGY QUESTIONNAIRE - 12/21/17 1107      Right Lower Extremity Lymphedema   20 cm Proximal to Suprapatella  55 cm   last week 55 inital eval 54.5   10 cm Proximal to Suprapatella  50 cm   51.2 inital eval 47.5   At Midpatella/Popliteal Crease  44 cm   45.8; inital eval 46   30 cm Proximal to Floor at Lateral Plantar Foot  38 cm   39.8; inital eval 42.5   20 cm Proximal to Floor at Lateral Plantar Foot  33 1   last week 30.8; inital eval 38,5   10 cm Proximal to Floor at Lateral Malleoli  29.8 cm   32.2; inital eval 37.5   Circumference of ankle/heel  37 cm.   38.5; inital eval 40.7   5 cm Proximal to 1st MTP Joint  27.5 cm  28 inital eval 28.3   Across MTP Joint  26 cm   27.8; inital eval 27.7   Around Proximal Great Toe  9.5 cm   10.2; initial eval 10      Left Lower Extremity Lymphedema   20 cm Proximal to Suprapatella  52 cm   last week was 54; initial eval 46.7   10 cm Proximal to Suprapatella  48 cm   51.4; eval 43.5   At Midpatella/Popliteal Crease  42 cm   41.6 eval 42.3   30 cm Proximal to Floor at Lateral Plantar Foot  39 cm   40.7 eval 38.5   20 cm Proximal to Floor at Lateral Plantar Foot  34.8 cm   35; eval 33.8   10 cm Proximal to Floor at Lateral Malleoli  30 cm   31.8; eval 41   Circumference of ankle/heel  37 cm.   39.5 eval 39.2   5 cm Proximal to 1st MTP Joint  28.2 cm   29.5; eval 27.6   Across MTP Joint  25.7 cm   27.5; eval 27.6   Around Proximal Great Toe  9 cm   9.7 was 10.4                [facility] Adult PT Treatment/Exercise - 12/21/17 0001      Manual Therapy   Manual Therapy  Manual  Lymphatic Drainage (MLD);Compression Bandaging    Manual therapy comments  done seperate from all other aspects of treatment     Manual Lymphatic Drainage (MLD)  supraclavicular, deepa and superfical abdominal followed by routing fluid using inguinal/axillary anastomosis B and anterior as well as posterior aspect of LE.  Posterior done prone      Compression Bandaging  using 1/2 " foam and muti layer short stretch bandages.                PT Short Term Goals - 11/30/17 1119      PT SHORT TERM GOAL #1   Title  Pt volume to decrease 2 cm to allow pt to walk without feeling like she is having to drag her right leg.     Time  2    Period  Weeks    Status  Achieved      PT SHORT TERM GOAL #2   Title  Pt to verbalize that she is completing her exercises at home to increae lymph circulation to decrease volume.     Time  2    Period  Weeks    Status  Achieved        PT Long Term Goals - 11/24/17 1327      PT LONG TERM GOAL #1   Title  Pt volume to have decreased by 4 cm to reduce risk of celllulitis     Time  4    Period  Weeks    Status  On-going      PT LONG TERM GOAL #2   Title  Pt to be able to don and doff her shoes and socks with greater ease (be able to fit into)     Time  4    Period  Weeks    Status  On-going      PT LONG TERM GOAL #3   Title  PT to have obtained and be using the compression pump for maintenance aspect of treatment.     Time  4    Period  Weeks    Status  On-going  PT LONG TERM GOAL #4   Title  Pt to have obtained and be able to don and doff compression garment.    Time  4    Period  Weeks    Status  On-going            Plan - 12/21/17 1109    Clinical Impression Statement  remeasured this session with little change, however both metatarsal regions and toes are showing a reduction as compared to last week.  spoke to pump rep yesterday and will be sent to patient in the next couple weeks.      Rehab Potential  Good    PT  Frequency  3x / week    PT Duration  6 weeks    PT Treatment/Interventions  Manual lymph drainage;Manual techniques;Compression bandaging;Patient/family education    PT Next Visit Plan  continue to complete manual lymph drainage.  If measurements remain mostly unchanged next week, move forward with acquiring a garment.      PT Home Exercise Plan  LE exerciss as well as diaphragmic breathing.     Consulted and Agree with Plan of Care  Patient;Family member/caregiver       Patient will benefit from skilled therapeutic intervention in order to improve the following deficits and impairments:  Decreased activity tolerance, Difficulty walking, Pain, Increased edema, Decreased range of motion  Visit Diagnosis: Lymphedema, not elsewhere classified     Problem List Patient Active Problem List   Diagnosis Date Noted  . Tachycardia-bradycardia ([place]) 10/23/2014  . Midsternal chest pain 09/24/2014  . Chest pain 09/22/2014  . CAD (coronary artery disease) 09/22/2014  . Atrial fibrillation with controlled ventricular response ([place]) 09/22/2014  . HTN (hypertension) 09/22/2014  . Diabetes mellitus type 2, insulin dependent ([place]) 09/22/2014  . Pacemaker - MDT ADDR01 Implanted: 05/31/08 Serial# [id number] 09/22/2014  . History of CHF (congestive heart failure) 09/22/2014  . Acute renal failure ([place]) 09/22/2014  . Gout 09/22/2014  . Abnormal EKG    Teena Irani, PTA/CLT [phone]  Teena Irani 12/21/2017, 11:14 AM  [place], [place], [address] Phone: [phone]   Fax:  [phone]  Name: Megan Walsh MRN: [id number] Date of Birth: February 23, 1933

## 2017-12-22 ENCOUNTER — Ambulatory Visit (HOSPITAL_COMMUNITY): Payer: Medicare Other | Admitting: Physical Therapy

## 2017-12-22 DIAGNOSIS — I890 Lymphedema, not elsewhere classified: Secondary | ICD-10-CM | POA: Diagnosis not present

## 2017-12-22 NOTE — Therapy (Signed)
[place], [place], [address] Phone: [phone]   Fax:  [phone]  Physical Therapy Treatment  Patient Details  Name: Megan Walsh MRN: [id number] Date of Birth: [birth year] Referring Provider: Carlyle Dolly    Encounter Date: 12/22/2017  PT End of Session - 12/22/17 1141    Visit Number  13    Number of Visits  18    Date for PT Re-Evaluation  01/13/18    Authorization Type  cert 4/6-5/6    Authorization - Visit Number  13    Authorization - Number of Visits  18    PT Start Time  8127    PT Stop Time  1138    PT Time Calculation (min)  68 min    Activity Tolerance  Patient tolerated treatment well    Behavior During Therapy  [facility] for tasks assessed/performed       Past Medical History:  Diagnosis Date  . Anxiety   . Arthritis   . CHF (congestive heart failure) ([place])   . Coronary artery disease   . Diabetes mellitus without complication ([place])   . Dysrhythmia    a-fib  . Fatty tumor   . History of gout   . Hypertension   . Myocardial infarction ([place])   . Permanent atrial fibrillation ([place])   . Presence of permanent cardiac pacemaker    MDT ADDR01/Implanted: 05/31/08  . Sleep apnea    cannot tolerate CPAP  . Symptomatic bradycardia     Past Surgical History:  Procedure Laterality Date  . ABDOMINAL HYSTERECTOMY    . CATARACT EXTRACTION W/PHACO Left 10/17/2017   Procedure: CATARACT EXTRACTION PHACO AND INTRAOCULAR LENS PLACEMENT LEFT EYE;  Surgeon: Tonny Branch, MD;  Location: [facility];  Service: Ophthalmology;  Laterality: Left;  CDE: 11.64  . CATARACT EXTRACTION W/PHACO Right 10/31/2017   Procedure: CATARACT EXTRACTION PHACO AND INTRAOCULAR LENS PLACEMENT RIGHT EYE;  Surgeon: Tonny Branch, MD;  Location: [facility];  Service: Ophthalmology;  Laterality: Right;  CDE: 11.19  . fatty tumor removal to left arm Right   . PACEMAKER GENERATOR CHANGE  05/31/08   at [facility]: MDT ADDR01/Implanted: 05/31/08 as gen  change by Dr Reinaldo Berber  . PACEMAKER INSERTION  1992    There were no vitals filed for this visit.  Subjective Assessment - 12/22/17 1140    Subjective  Pt reports no issues, comes with aide today.    Currently in Pain?  No/denies                       [facility] Adult PT Treatment/Exercise - 12/22/17 1141      Manual Therapy   Manual Therapy  Manual Lymphatic Drainage (MLD);Compression Bandaging    Manual therapy comments  done seperate from all other aspects of treatment     Manual Lymphatic Drainage (MLD)  supraclavicular, deepa and superfical abdominal followed by routing fluid using inguinal/axillary anastomosis B and anterior as well as posterior aspect of LE.  Posterior done prone      Compression Bandaging  using 1/2 " foam and muti layer short stretch bandages.                PT Short Term Goals - 11/30/17 1119      PT SHORT TERM GOAL #1   Title  Pt volume to decrease 2 cm to allow pt to walk without feeling like she is having to drag her right leg.  Time  2    Period  Weeks    Status  Achieved      PT SHORT TERM GOAL #2   Title  Pt to verbalize that she is completing her exercises at home to increae lymph circulation to decrease volume.     Time  2    Period  Weeks    Status  Achieved        PT Long Term Goals - 11/24/17 1327      PT LONG TERM GOAL #1   Title  Pt volume to have decreased by 4 cm to reduce risk of celllulitis     Time  4    Period  Weeks    Status  On-going      PT LONG TERM GOAL #2   Title  Pt to be able to don and doff her shoes and socks with greater ease (be able to fit into)     Time  4    Period  Weeks    Status  On-going      PT LONG TERM GOAL #3   Title  PT to have obtained and be using the compression pump for maintenance aspect of treatment.     Time  4    Period  Weeks    Status  On-going      PT LONG TERM GOAL #4   Title  Pt to have obtained and be able to don and doff compression garment.    Time   4    Period  Weeks    Status  On-going            Plan - 12/22/17 1141    Clinical Impression Statement  Continues to be complaint with lymphedema therapy.  No induratio present today in LE's or feet.  Manual lymph drainage followed by multilayer compression to bilateral LE's below knee level completed.  Pt reported overall comfort.     Rehab Potential  Good    PT Frequency  3x / week    PT Duration  6 weeks    PT Treatment/Interventions  Manual lymph drainage;Manual techniques;Compression bandaging;Patient/family education    PT Next Visit Plan  continue to complete manual lymph drainage.  If measurements remain mostly unchanged next week, move forward with acquiring a garment.      PT Home Exercise Plan  LE exerciss as well as diaphragmic breathing.     Consulted and Agree with Plan of Care  Patient;Family member/caregiver       Patient will benefit from skilled therapeutic intervention in order to improve the following deficits and impairments:  Decreased activity tolerance, Difficulty walking, Pain, Increased edema, Decreased range of motion  Visit Diagnosis: Lymphedema, not elsewhere classified     Problem List Patient Active Problem List   Diagnosis Date Noted  . Tachycardia-bradycardia ([place]) 10/23/2014  . Midsternal chest pain 09/24/2014  . Chest pain 09/22/2014  . CAD (coronary artery disease) 09/22/2014  . Atrial fibrillation with controlled ventricular response ([place]) 09/22/2014  . HTN (hypertension) 09/22/2014  . Diabetes mellitus type 2, insulin dependent ([place]) 09/22/2014  . Pacemaker - MDT ADDR01 Implanted: 05/31/08 Serial# [id number] 09/22/2014  . History of CHF (congestive heart failure) 09/22/2014  . Acute renal failure ([place]) 09/22/2014  . Gout 09/22/2014  . Abnormal EKG    Teena Irani, PTA/CLT [phone]  Teena Irani 12/22/2017, 11:43 AM  [place], [place], [address] Phone:  [phone]  Fax:  [phone]  Name: Megan Walsh MRN: [id number] Date of Birth: 04-11-1933

## 2017-12-26 ENCOUNTER — Ambulatory Visit (HOSPITAL_COMMUNITY): Payer: Medicare Other | Admitting: Physical Therapy

## 2017-12-26 DIAGNOSIS — I890 Lymphedema, not elsewhere classified: Secondary | ICD-10-CM

## 2017-12-26 NOTE — Therapy (Signed)
[place], [place], [address] Phone: [phone]   Fax:  [phone]  Physical Therapy Treatment  Patient Details  Name: Megan Walsh MRN: [id number] Date of Birth: 1932-12-11 Referring Provider: Carlyle Dolly    Encounter Date: 12/26/2017  PT End of Session - 12/26/17 1202    Visit Number  14    Number of Visits  18    Date for PT Re-Evaluation  01/13/18    Authorization Type  cert 6/6-4/4    Authorization - Visit Number  14    Authorization - Number of Visits  18    PT Start Time  0347    PT Stop Time  1144    PT Time Calculation (min)  62 min    Activity Tolerance  Patient tolerated treatment well    Behavior During Therapy  [facility] for tasks assessed/performed       Past Medical History:  Diagnosis Date  . Anxiety   . Arthritis   . CHF (congestive heart failure) ([place])   . Coronary artery disease   . Diabetes mellitus without complication ([place])   . Dysrhythmia    a-fib  . Fatty tumor   . History of gout   . Hypertension   . Myocardial infarction ([place])   . Permanent atrial fibrillation ([place])   . Presence of permanent cardiac pacemaker    MDT ADDR01/Implanted: 05/31/08  . Sleep apnea    cannot tolerate CPAP  . Symptomatic bradycardia     Past Surgical History:  Procedure Laterality Date  . ABDOMINAL HYSTERECTOMY    . CATARACT EXTRACTION W/PHACO Left 10/17/2017   Procedure: CATARACT EXTRACTION PHACO AND INTRAOCULAR LENS PLACEMENT LEFT EYE;  Surgeon: Tonny Branch, MD;  Location: [facility];  Service: Ophthalmology;  Laterality: Left;  CDE: 11.64  . CATARACT EXTRACTION W/PHACO Right 10/31/2017   Procedure: CATARACT EXTRACTION PHACO AND INTRAOCULAR LENS PLACEMENT RIGHT EYE;  Surgeon: Tonny Branch, MD;  Location: [facility];  Service: Ophthalmology;  Laterality: Right;  CDE: 11.19  . fatty tumor removal to left arm Right   . PACEMAKER GENERATOR CHANGE  05/31/08   at [facility]: MDT ADDR01/Implanted: 05/31/08 as gen  change by Dr Reinaldo Berber  . PACEMAKER INSERTION  1992    There were no vitals filed for this visit.  Subjective Assessment - 12/26/17 1155    Subjective  Pt states she removed her bandages yesterday.  No issues    Currently in Pain?  No/denies                       [facility] Adult PT Treatment/Exercise - 12/26/17 0001      Manual Therapy   Manual Therapy  Manual Lymphatic Drainage (MLD);Compression Bandaging    Manual therapy comments  done seperate from all other aspects of treatment     Manual Lymphatic Drainage (MLD)  supraclavicular, deepa and superfical abdominal followed by routing fluid using inguinal/axillary anastomosis B and anterior as well as posterior aspect of LE.  Posterior done prone      Compression Bandaging  using 1/2 " foam and muti layer short stretch bandages.                PT Short Term Goals - 11/30/17 1119      PT SHORT TERM GOAL #1   Title  Pt volume to decrease 2 cm to allow pt to walk without feeling like she is having to drag her right leg.  Time  2    Period  Weeks    Status  Achieved      PT SHORT TERM GOAL #2   Title  Pt to verbalize that she is completing her exercises at home to increae lymph circulation to decrease volume.     Time  2    Period  Weeks    Status  Achieved        PT Long Term Goals - 11/24/17 1327      PT LONG TERM GOAL #1   Title  Pt volume to have decreased by 4 cm to reduce risk of celllulitis     Time  4    Period  Weeks    Status  On-going      PT LONG TERM GOAL #2   Title  Pt to be able to don and doff her shoes and socks with greater ease (be able to fit into)     Time  4    Period  Weeks    Status  On-going      PT LONG TERM GOAL #3   Title  PT to have obtained and be using the compression pump for maintenance aspect of treatment.     Time  4    Period  Weeks    Status  On-going      PT LONG TERM GOAL #4   Title  Pt to have obtained and be able to don and doff compression  garment.    Time  4    Period  Weeks    Status  On-going            Plan - 12/26/17 1202    Clinical Impression Statement  continued with manual lymphedema treatment including manual lymph drainage and compression bandaging using 1/2" foam and multilayer short stretch.  Toe bandaging utilized as well.  No induration present today in LE's or feet.  Pt overall improving.     Rehab Potential  Good    PT Frequency  3x / week    PT Duration  6 weeks    PT Treatment/Interventions  Manual lymph drainage;Manual techniques;Compression bandaging;Patient/family education    PT Next Visit Plan  continue to complete manual lymph drainage.  If measurements remain mostly unchanged next visit, move forward with acquiring a garment.      PT Home Exercise Plan  LE exerciss as well as diaphragmic breathing.     Consulted and Agree with Plan of Care  Patient;Family member/caregiver       Patient will benefit from skilled therapeutic intervention in order to improve the following deficits and impairments:  Decreased activity tolerance, Difficulty walking, Pain, Increased edema, Decreased range of motion  Visit Diagnosis: Lymphedema, not elsewhere classified     Problem List Patient Active Problem List   Diagnosis Date Noted  . Tachycardia-bradycardia ([place]) 10/23/2014  . Midsternal chest pain 09/24/2014  . Chest pain 09/22/2014  . CAD (coronary artery disease) 09/22/2014  . Atrial fibrillation with controlled ventricular response ([place]) 09/22/2014  . HTN (hypertension) 09/22/2014  . Diabetes mellitus type 2, insulin dependent ([place]) 09/22/2014  . Pacemaker - MDT ADDR01 Implanted: 05/31/08 Serial# [id number] 09/22/2014  . History of CHF (congestive heart failure) 09/22/2014  . Acute renal failure ([place]) 09/22/2014  . Gout 09/22/2014  . Abnormal EKG    Teena Irani, PTA/CLT [phone]  Teena Irani 12/26/2017, 12:07 PM  [place], [place], [address]  Phone: [phone]   Fax:  [phone]  Name: Megan Walsh MRN: [id number] Date of Birth: 02-09-1933

## 2017-12-28 ENCOUNTER — Ambulatory Visit (HOSPITAL_COMMUNITY): Payer: Medicare Other | Admitting: Physical Therapy

## 2017-12-28 DIAGNOSIS — I890 Lymphedema, not elsewhere classified: Secondary | ICD-10-CM | POA: Diagnosis not present

## 2017-12-28 NOTE — Therapy (Signed)
[place], [place], [address] Phone: [phone]   Fax:  [phone]  Physical Therapy Treatment  Patient Details  Name: Megan Walsh MRN: [id number] Date of Birth: [birth year] Referring Provider: Carlyle Dolly    Encounter Date: 12/28/2017  PT End of Session - 12/28/17 1232    Visit Number  15    Number of Visits  18    Date for PT Re-Evaluation  01/13/18    Authorization Type  cert 3/2-4/4    Authorization - Visit Number  15    Authorization - Number of Visits  18    PT Start Time  0102    PT Stop Time  1148    PT Time Calculation (min)  68 min    Activity Tolerance  Patient tolerated treatment well    Behavior During Therapy  [facility] for tasks assessed/performed       Past Medical History:  Diagnosis Date  . Anxiety   . Arthritis   . CHF (congestive heart failure) ([place])   . Coronary artery disease   . Diabetes mellitus without complication ([place])   . Dysrhythmia    a-fib  . Fatty tumor   . History of gout   . Hypertension   . Myocardial infarction ([place])   . Permanent atrial fibrillation ([place])   . Presence of permanent cardiac pacemaker    MDT ADDR01/Implanted: 05/31/08  . Sleep apnea    cannot tolerate CPAP  . Symptomatic bradycardia     Past Surgical History:  Procedure Laterality Date  . ABDOMINAL HYSTERECTOMY    . CATARACT EXTRACTION W/PHACO Left 10/17/2017   Procedure: CATARACT EXTRACTION PHACO AND INTRAOCULAR LENS PLACEMENT LEFT EYE;  Surgeon: Tonny Branch, MD;  Location: [facility];  Service: Ophthalmology;  Laterality: Left;  CDE: 11.64  . CATARACT EXTRACTION W/PHACO Right 10/31/2017   Procedure: CATARACT EXTRACTION PHACO AND INTRAOCULAR LENS PLACEMENT RIGHT EYE;  Surgeon: Tonny Branch, MD;  Location: [facility];  Service: Ophthalmology;  Laterality: Right;  CDE: 11.19  . fatty tumor removal to left arm Right   . PACEMAKER GENERATOR CHANGE  05/31/08   at [facility]: MDT ADDR01/Implanted: 05/31/08 as gen  change by Dr Reinaldo Berber  . PACEMAKER INSERTION  1992    There were no vitals filed for this visit.  Subjective Assessment - 12/28/17 1215    Subjective  Pt states she removed her bandages last night.  No pain or issues.      Currently in Pain?  No/denies            LYMPHEDEMA/ONCOLOGY QUESTIONNAIRE - 12/28/17 1217      What other symptoms do you have   Are you Having Heaviness or Tightness  Yes    Are you having Pain  Yes    Are you having pitting edema  Yes    Is it Hard or Difficult finding clothes that fit  Yes    Stemmer Sign  Yes      Lymphedema Stage   Stage  STAGE 2 SPONTANEOUSLY IRREVERSIBLE      Lymphedema Assessments   Lymphedema Assessments  Lower extremities      Right Lower Extremity Lymphedema   20 cm Proximal to Suprapatella  55 cm   was 54.5cm on 7/16 initial evaluation   10 cm Proximal to Suprapatella  49 cm   was 47.5 cm   At Midpatella/Popliteal Crease  45.5 cm   46 cm   30 cm Proximal to Floor at  Lateral Plantar Foot  37.8 cm   42.5 cm   20 cm Proximal to Floor at Lateral Plantar Foot  31.8 1   38.5 cm   10 cm Proximal to Floor at Lateral Malleoli  30.3 cm   37.5cm   Circumference of ankle/heel  38.5 cm.   40.7cm   5 cm Proximal to 1st MTP Joint  27.8 cm   28.3cm   Across MTP Joint  26.5 cm   27.7cm   Around Proximal Great Toe  9.8 cm   10cm     Left Lower Extremity Lymphedema   20 cm Proximal to Suprapatella  53 cm   was 53.8 cm on 7/16 initial evaluation   10 cm Proximal to Suprapatella  48 cm   46.7cm   At Midpatella/Popliteal Crease  38.2 cm   43.5cm   30 cm Proximal to Floor at Lateral Plantar Foot  38.2 cm   42.3 cm   20 cm Proximal to Floor at Lateral Plantar Foot  32.3 cm   38.5cm   10 cm Proximal to Floor at Lateral Malleoli  30 cm   33.8cm   Circumference of ankle/heel  38 cm.   41cm   5 cm Proximal to 1st MTP Joint  29.4 cm   29.2cm   Across MTP Joint  27.3 cm   27.6cm   Around Proximal Great Toe  10 cm   10.4cm                [facility] Adult PT Treatment/Exercise - 12/28/17 1216      Manual Therapy   Manual Therapy  Manual Lymphatic Drainage (MLD);Compression Bandaging    Manual therapy comments  done seperate from all other aspects of treatment     Manual Lymphatic Drainage (MLD)  supraclavicular, deepa and superfical abdominal followed by routing fluid using inguinal/axillary anastomosis B and anterior as well as posterior aspect of LE.  Posterior done prone      Compression Bandaging  using 1/2 " foam and muti layer short stretch bandages.                PT Short Term Goals - 11/30/17 1119      PT SHORT TERM GOAL #1   Title  Pt volume to decrease 2 cm to allow pt to walk without feeling like she is having to drag her right leg.     Time  2    Period  Weeks    Status  Achieved      PT SHORT TERM GOAL #2   Title  Pt to verbalize that she is completing her exercises at home to increae lymph circulation to decrease volume.     Time  2    Period  Weeks    Status  Achieved        PT Long Term Goals - 11/24/17 1327      PT LONG TERM GOAL #1   Title  Pt volume to have decreased by 4 cm to reduce risk of celllulitis     Time  4    Period  Weeks    Status  On-going      PT LONG TERM GOAL #2   Title  Pt to be able to don and doff her shoes and socks with greater ease (be able to fit into)     Time  4    Period  Weeks    Status  On-going      PT LONG TERM  GOAL #3   Title  PT to have obtained and be using the compression pump for maintenance aspect of treatment.     Time  4    Period  Weeks    Status  On-going      PT LONG TERM GOAL #4   Title  Pt to have obtained and be able to don and doff compression garment.    Time  4    Period  Weeks    Status  On-going            Plan - 12/28/17 1232    Clinical Impression Statement  Remeasured this session with overall improvement noted proximal LE's, however fluctuating measurements in feet and ankles.  Very little  induration present in this area, mostly soft tissue.  To discuss with evaluating therapist regarding moving forward wtih obtaining a garment.      Rehab Potential  Good    PT Frequency  3x / week    PT Duration  6 weeks    PT Treatment/Interventions  Manual lymph drainage;Manual techniques;Compression bandaging;Patient/family education    PT Next Visit Plan  Per evaluating therapist discretion, assess whether to continue manual lymph therapy or move forward with acquiring a garment next session.      PT Home Exercise Plan  LE exerciss as well as diaphragmic breathing.     Consulted and Agree with Plan of Care  Patient;Family member/caregiver       Patient will benefit from skilled therapeutic intervention in order to improve the following deficits and impairments:  Decreased activity tolerance, Difficulty walking, Pain, Increased edema, Decreased range of motion  Visit Diagnosis: Lymphedema, not elsewhere classified     Problem List Patient Active Problem List   Diagnosis Date Noted  . Tachycardia-bradycardia ([place]) 10/23/2014  . Midsternal chest pain 09/24/2014  . Chest pain 09/22/2014  . CAD (coronary artery disease) 09/22/2014  . Atrial fibrillation with controlled ventricular response ([place]) 09/22/2014  . HTN (hypertension) 09/22/2014  . Diabetes mellitus type 2, insulin dependent ([place]) 09/22/2014  . Pacemaker - MDT ADDR01 Implanted: 05/31/08 Serial# [id number] 09/22/2014  . History of CHF (congestive heart failure) 09/22/2014  . Acute renal failure ([place]) 09/22/2014  . Gout 09/22/2014  . Abnormal EKG    Teena Irani, PTA/CLT [phone]  Teena Irani 12/28/2017, 1:24 PM  [place] [address] [place], [place], [address] Phone: [phone]   Fax:  [phone]  Name: Megan Walsh MRN: [id number] Date of Birth: 09-27-1932

## 2017-12-30 ENCOUNTER — Ambulatory Visit (HOSPITAL_COMMUNITY): Payer: Medicare Other | Admitting: Physical Therapy

## 2017-12-30 DIAGNOSIS — I890 Lymphedema, not elsewhere classified: Secondary | ICD-10-CM | POA: Diagnosis not present

## 2017-12-30 NOTE — Therapy (Signed)
[place], [place], [address] Phone: [phone]   Fax:  [phone]  Physical Therapy Treatment  Patient Details  Name: HELINA HULLUM MRN: [id number] Date of Birth: [birth year] Referring Provider: Carlyle Dolly    Encounter Date: 12/30/2017  PT End of Session - 12/30/17 1205    Visit Number  16    Number of Visits  18    Date for PT Re-Evaluation  01/13/18    Authorization Type  cert 0/6-0/0    Authorization - Visit Number  16    Authorization - Number of Visits  18    PT Start Time  4599    PT Stop Time  1155    PT Time Calculation (min)  75 min    Activity Tolerance  Patient tolerated treatment well    Behavior During Therapy  [facility] for tasks assessed/performed       Past Medical History:  Diagnosis Date  . Anxiety   . Arthritis   . CHF (congestive heart failure) ([place])   . Coronary artery disease   . Diabetes mellitus without complication ([place])   . Dysrhythmia    a-fib  . Fatty tumor   . History of gout   . Hypertension   . Myocardial infarction ([place])   . Permanent atrial fibrillation ([place])   . Presence of permanent cardiac pacemaker    MDT ADDR01/Implanted: 05/31/08  . Sleep apnea    cannot tolerate CPAP  . Symptomatic bradycardia     Past Surgical History:  Procedure Laterality Date  . ABDOMINAL HYSTERECTOMY    . CATARACT EXTRACTION W/PHACO Left 10/17/2017   Procedure: CATARACT EXTRACTION PHACO AND INTRAOCULAR LENS PLACEMENT LEFT EYE;  Surgeon: Tonny Branch, MD;  Location: [facility];  Service: Ophthalmology;  Laterality: Left;  CDE: 11.64  . CATARACT EXTRACTION W/PHACO Right 10/31/2017   Procedure: CATARACT EXTRACTION PHACO AND INTRAOCULAR LENS PLACEMENT RIGHT EYE;  Surgeon: Tonny Branch, MD;  Location: [facility];  Service: Ophthalmology;  Laterality: Right;  CDE: 11.19  . fatty tumor removal to left arm Right   . PACEMAKER GENERATOR CHANGE  05/31/08   at [facility]: MDT ADDR01/Implanted: 05/31/08 as gen  change by Dr Reinaldo Berber  . PACEMAKER INSERTION  1992    There were no vitals filed for this visit.  Subjective Assessment - 12/30/17 1202    Subjective  PT states that she is going to try and keep her bandages on all weekend long.  Pt states that she has noticed that she can move around a lot better.     Currently in Pain?  No/denies                       [facility] Adult PT Treatment/Exercise - 12/30/17 0001      Manual Therapy   Manual Therapy  Manual Lymphatic Drainage (MLD);Compression Bandaging    Manual therapy comments  done seperate from all other aspects of treatment     Manual Lymphatic Drainage (MLD)  supraclavicular, deepa and superfical abdominal followed by routing fluid using inguinal/axillary anastomosis B and anterior as well as posterior aspect of LE.  Posterior done prone.  Therapist completet 100 strokes at ankle and foot area to attempt to decreased congestion located around ankle.       Compression Bandaging  added 1/2 " foam half circles to double foam at posterior ankle area  and muti layer short stretch bandages.  PT Short Term Goals - 11/30/17 1119      PT SHORT TERM GOAL #1   Title  Pt volume to decrease 2 cm to allow pt to walk without feeling like she is having to drag her right leg.     Time  2    Period  Weeks    Status  Achieved      PT SHORT TERM GOAL #2   Title  Pt to verbalize that she is completing her exercises at home to increae lymph circulation to decrease volume.     Time  2    Period  Weeks    Status  Achieved        PT Long Term Goals - 12/30/17 1212      PT LONG TERM GOAL #1   Title  Pt volume to have decreased by 4 cm to reduce risk of celllulitis     Time  4    Period  Weeks    Status  Partially Met      PT LONG TERM GOAL #2   Title  Pt to be able to don and doff her shoes and socks with greater ease (be able to fit into)     Time  4    Period  Weeks    Status  Partially Met   able to  when bandaging is off     PT LONG TERM GOAL #3   Title  PT to have obtained and be using the compression pump for maintenance aspect of treatment.     Time  4    Period  Weeks    Status  On-going      PT LONG TERM GOAL #4   Title  Pt to have obtained and be able to don and doff compression garment.    Time  4    Period  Weeks    Status  On-going            Plan - 12/30/17 1205    Clinical Impression Statement  Therapist increased strokes at ankle and foot to 100 each.  Cut and added 1/2 circles of 1/2" foam to posterior aspect of malleoli to have double foam to attempt to decrease induration located behind malleoli B.  PT given prescription for garment as well as places to acquire them.  Pt still has not heard anything about her pump.  Therapist called [facility] and left a message regarding no pump.      Rehab Potential  Good    PT Frequency  3x / week    PT Duration  6 weeks    PT Treatment/Interventions  Manual lymph drainage;Manual techniques;Compression bandaging;Patient/family education    PT Next Visit Plan  Check to see if pt wants [place] to measure her to call outlet for garments or if she wants to purchase from a local facility.  Quick measurement at heel/foot to see if any volume change.  If so we will continue for 2 more weeks if not we will continue for this week to allow pt to acquire pump and garment prior to discharge.     PT Home Exercise Plan  LE exerciss as well as diaphragmic breathing.     Consulted and Agree with Plan of Care  Patient;Family member/caregiver       Patient will benefit from skilled therapeutic intervention in order to improve the following deficits and impairments:  Decreased activity tolerance, Difficulty walking, Pain, Increased edema, Decreased range of motion  Visit Diagnosis: Lymphedema, not elsewhere classified     Problem List Patient Active Problem List   Diagnosis Date Noted  . Tachycardia-bradycardia ([place]) 10/23/2014  .  Midsternal chest pain 09/24/2014  . Chest pain 09/22/2014  . CAD (coronary artery disease) 09/22/2014  . Atrial fibrillation with controlled ventricular response ([place]) 09/22/2014  . HTN (hypertension) 09/22/2014  . Diabetes mellitus type 2, insulin dependent ([place]) 09/22/2014  . Pacemaker - MDT ADDR01 Implanted: 05/31/08 Serial# [id number] 09/22/2014  . History of CHF (congestive heart failure) 09/22/2014  . Acute renal failure ([place]) 09/22/2014  . Gout 09/22/2014  . Abnormal EKG     Mathis Cashman,CINDY 12/30/2017, 12:13 PM  [place] [address] [place], [place], [address] Phone: [phone]   Fax:  [phone]  Name: KAJSA BUTRUM MRN: [id number] Date of Birth: 1933/01/18

## 2018-01-02 ENCOUNTER — Ambulatory Visit (HOSPITAL_COMMUNITY): Payer: Medicare Other | Admitting: Physical Therapy

## 2018-01-02 DIAGNOSIS — I890 Lymphedema, not elsewhere classified: Secondary | ICD-10-CM

## 2018-01-02 NOTE — Therapy (Signed)
[place], [place], [address] Phone: [phone]   Fax:  [phone]  Physical Therapy Treatment  Patient Details  Name: Megan Walsh MRN: [id number] Date of Birth: 21-Aug-1932 Referring Provider: Carlyle Dolly    Encounter Date: 01/02/2018  PT End of Session - 01/02/18 1806    Visit Number  17    Number of Visits  18    Date for PT Re-Evaluation  01/13/18    Authorization Type  cert 6/8-1/1    Authorization - Visit Number  66    Authorization - Number of Visits  18    PT Start Time  1038    PT Stop Time  1200    PT Time Calculation (min)  82 min    Activity Tolerance  Patient tolerated treatment well    Behavior During Therapy  [facility] for tasks assessed/performed       Past Medical History:  Diagnosis Date  . Anxiety   . Arthritis   . CHF (congestive heart failure) ([place])   . Coronary artery disease   . Diabetes mellitus without complication ([place])   . Dysrhythmia    a-fib  . Fatty tumor   . History of gout   . Hypertension   . Myocardial infarction ([place])   . Permanent atrial fibrillation ([place])   . Presence of permanent cardiac pacemaker    MDT ADDR01/Implanted: 05/31/08  . Sleep apnea    cannot tolerate CPAP  . Symptomatic bradycardia     Past Surgical History:  Procedure Laterality Date  . ABDOMINAL HYSTERECTOMY    . CATARACT EXTRACTION W/PHACO Left 10/17/2017   Procedure: CATARACT EXTRACTION PHACO AND INTRAOCULAR LENS PLACEMENT LEFT EYE;  Surgeon: Tonny Branch, MD;  Location: [facility];  Service: Ophthalmology;  Laterality: Left;  CDE: 11.64  . CATARACT EXTRACTION W/PHACO Right 10/31/2017   Procedure: CATARACT EXTRACTION PHACO AND INTRAOCULAR LENS PLACEMENT RIGHT EYE;  Surgeon: Tonny Branch, MD;  Location: [facility];  Service: Ophthalmology;  Laterality: Right;  CDE: 11.19  . fatty tumor removal to left arm Right   . PACEMAKER GENERATOR CHANGE  05/31/08   at [facility]: MDT ADDR01/Implanted: 05/31/08 as gen  change by Dr Reinaldo Berber  . PACEMAKER INSERTION  1992    There were no vitals filed for this visit.  Subjective Assessment - 01/02/18 1804    Subjective  Pt states she tried to keep her bandages on longer.  No issues reported today.    Currently in Pain?  No/denies            LYMPHEDEMA/ONCOLOGY QUESTIONNAIRE - 01/02/18 1056      Right Lower Extremity Lymphedema   10 cm Proximal to Floor at Lateral Malleoli  31 cm  (Pended)     Circumference of ankle/heel  38.8 cm.  (Pended)     5 cm Proximal to 1st MTP Joint  28.6 cm  (Pended)     Across MTP Joint  26.5 cm  (Pended)     Around Proximal Great Toe  10 cm  (Pended)       Left Lower Extremity Lymphedema   10 cm Proximal to Floor at Lateral Malleoli  31 cm  (Pended)     Circumference of ankle/heel  38 cm.  (Pended)     5 cm Proximal to 1st MTP Joint  29.5 cm  (Pended)     Across MTP Joint  27.5 cm  (Pended)     Around Proximal Great Toe  9.6  cm  (Pended)                           PT Short Term Goals - 11/30/17 1119      PT SHORT TERM GOAL #1   Title  Pt volume to decrease 2 cm to allow pt to walk without feeling like she is having to drag her right leg.     Time  2    Period  Weeks    Status  Achieved      PT SHORT TERM GOAL #2   Title  Pt to verbalize that she is completing her exercises at home to increae lymph circulation to decrease volume.     Time  2    Period  Weeks    Status  Achieved        PT Long Term Goals - 12/30/17 1212      PT LONG TERM GOAL #1   Title  Pt volume to have decreased by 4 cm to reduce risk of celllulitis     Time  4    Period  Weeks    Status  Partially Met      PT LONG TERM GOAL #2   Title  Pt to be able to don and doff her shoes and socks with greater ease (be able to fit into)     Time  4    Period  Weeks    Status  Partially Met   able to when bandaging is off     PT LONG TERM GOAL #3   Title  PT to have obtained and be using the compression pump for  maintenance aspect of treatment.     Time  4    Period  Weeks    Status  On-going      PT LONG TERM GOAL #4   Title  Pt to have obtained and be able to don and doff compression garment.    Time  4    Period  Weeks    Status  On-going            Plan - 01/02/18 1803    Clinical Impression Statement  PT states she tried to keep her bandages on longer but ended up removing them sunday at 2pm.  comes today with noted swelling in dorsum of feet and into ankles.  Measurements indicate an increase in volume today.  Pt given measurements to obtain knee highs and shown the sock butler that may ease in donning.  No word on her pump yet or contact wtih [facility] rep.    Rehab Potential  Good    PT Frequency  3x / week    PT Duration  6 weeks    PT Treatment/Interventions  Manual lymph drainage;Manual techniques;Compression bandaging;Patient/family education    PT Next Visit Plan  continue for this week to allow pt to acquire pump and garment prior to discharge. follow up on order and expected arrival of garments.     PT Home Exercise Plan  LE exerciss as well as diaphragmic breathing.     Consulted and Agree with Plan of Care  Patient;Family member/caregiver       Patient will benefit from skilled therapeutic intervention in order to improve the following deficits and impairments:  Decreased activity tolerance, Difficulty walking, Pain, Increased edema, Decreased range of motion  Visit Diagnosis: Lymphedema, not elsewhere classified     Problem List Patient Active Problem List  Diagnosis Date Noted  . Tachycardia-bradycardia ([place]) 10/23/2014  . Midsternal chest pain 09/24/2014  . Chest pain 09/22/2014  . CAD (coronary artery disease) 09/22/2014  . Atrial fibrillation with controlled ventricular response ([place]) 09/22/2014  . HTN (hypertension) 09/22/2014  . Diabetes mellitus type 2, insulin dependent ([place]) 09/22/2014  . Pacemaker - MDT ADDR01 Implanted: 05/31/08 Serial#  [id number] 09/22/2014  . History of CHF (congestive heart failure) 09/22/2014  . Acute renal failure ([place]) 09/22/2014  . Gout 09/22/2014  . Abnormal EKG    Teena Irani, PTA/CLT [phone]  Teena Irani 01/02/2018, 6:08 PM  [place], [place], [address] Phone: [phone]   Fax:  [phone]  Name: Megan Walsh MRN: [id number] Date of Birth: 02/15/1933

## 2018-01-04 ENCOUNTER — Ambulatory Visit (HOSPITAL_COMMUNITY): Payer: Medicare Other | Admitting: Physical Therapy

## 2018-01-04 DIAGNOSIS — I890 Lymphedema, not elsewhere classified: Secondary | ICD-10-CM

## 2018-01-04 NOTE — Therapy (Signed)
[place], [place], [address] Phone: [phone]   Fax:  [phone]  Physical Therapy Treatment  Patient Details  Name: Megan Walsh MRN: [id number] Date of Birth: May 18, 1932 Referring Provider: Carlyle Dolly    Encounter Date: 01/04/2018  PT End of Session - 01/04/18 1156    Visit Number  18    Number of Visits  30 Request additional 4 weeks    Date for PT Re-Evaluation  01/13/18    Authorization Type  cert 2/99-37/1    Authorization - Visit Number  18    Authorization - Number of Visits   30   PT Start Time  6967    PT Stop Time  1146    PT Time Calculation (min)  70 min    Activity Tolerance  Patient tolerated treatment well    Behavior During Therapy  [facility] for tasks assessed/performed       Past Medical History:  Diagnosis Date  . Anxiety   . Arthritis   . CHF (congestive heart failure) ([place])   . Coronary artery disease   . Diabetes mellitus without complication ([place])   . Dysrhythmia    a-fib  . Fatty tumor   . History of gout   . Hypertension   . Myocardial infarction ([place])   . Permanent atrial fibrillation ([place])   . Presence of permanent cardiac pacemaker    MDT ADDR01/Implanted: 05/31/08  . Sleep apnea    cannot tolerate CPAP  . Symptomatic bradycardia     Past Surgical History:  Procedure Laterality Date  . ABDOMINAL HYSTERECTOMY    . CATARACT EXTRACTION W/PHACO Left 10/17/2017   Procedure: CATARACT EXTRACTION PHACO AND INTRAOCULAR LENS PLACEMENT LEFT EYE;  Surgeon: Tonny Branch, MD;  Location: [facility];  Service: Ophthalmology;  Laterality: Left;  CDE: 11.64  . CATARACT EXTRACTION W/PHACO Right 10/31/2017   Procedure: CATARACT EXTRACTION PHACO AND INTRAOCULAR LENS PLACEMENT RIGHT EYE;  Surgeon: Tonny Branch, MD;  Location: [facility];  Service: Ophthalmology;  Laterality: Right;  CDE: 11.19  . fatty tumor removal to left arm Right   . PACEMAKER GENERATOR CHANGE  05/31/08   at [facility]: MDT  ADDR01/Implanted: 05/31/08 as gen change by Dr Reinaldo Berber  . PACEMAKER INSERTION  1992    There were no vitals filed for this visit.  Subjective Assessment - 01/04/18 1155    Subjective  Pt states she is doing well.  comes today wearing her garments and requests to be measured at her smallest prior to ordering her garments.     Currently in Pain?  No/denies         [facility] PT Assessment - 01/05/18 0001      Assessment   Medical Diagnosis  B Lymphedma    Referring Provider  Carlyle Dolly     Onset Date/Surgical Date  --   chronic   Next MD Visit  11/27/2017    Prior Therapy  none      Precautions   Precautions  Other (comment)    Precaution Comments  cellulitis      Restrictions   Weight Bearing Restrictions  No      Balance Screen   Has the patient fallen in the past 6 months  Yes    How many times?  2    Has the patient had a decrease in activity level because of a fear of falling?   Yes    Is the patient reluctant to leave their home  because of a fear of falling?   No      Prior Function   Level of Independence  Independent      Cognition   Overall Cognitive Status  Within Functional Limits for tasks assessed        LYMPHEDEMA/ONCOLOGY QUESTIONNAIRE - 01/04/18 1158      Right Lower Extremity Lymphedema   At Midpatella/Popliteal Crease  46.5 cm   on 8/21 was 45.5   30 cm Proximal to Floor at Lateral Plantar Foot  37.4 cm   on 8/21 was 37.8   20 cm Proximal to Floor at Lateral Plantar Foot  31.8 1   was 31.8   10 cm Proximal to Floor at Lateral Malleoli  29 cm   was 30.3   Circumference of ankle/heel  36.5 cm.   was 38.5   5 cm Proximal to 1st MTP Joint  26 cm   was 27.8   Across MTP Joint  25.5 cm   was 26.5   Around Proximal Great Toe  10 cm   was 9.8     Left Lower Extremity Lymphedema   At Midpatella/Popliteal Crease  43.4 cm   was 38.2 on 12/28/2017   30 cm Proximal to Floor at Lateral Plantar Foot  37.9 cm   was 38.2   20 cm Proximal to Floor  at Lateral Plantar Foot  32.3 cm   was 32.3   10 cm Proximal to Floor at Lateral Malleoli  28.4 cm   was 30   Circumference of ankle/heel  36.2 cm.   was 38   5 cm Proximal to 1st MTP Joint  28 cm   was 29.4   Across MTP Joint  26 cm   was 27.3   Around Proximal Great Toe  10 cm   was 10             01/04/18 0001  Manual Therapy  Manual Therapy Manual Lymphatic Drainage (MLD);Compression Bandaging  Manual therapy comments done seperate from all other aspects of treatment   Manual Lymphatic Drainage (MLD) supraclavicular, deepa and superfical abdominal followed by routing fluid using inguinal/axillary anastomosis B and anterior as well as posterior aspect of LE.  Posterior done prone.  Therapist completet 100 strokes at ankle and foot area to attempt to decreased congestion located around ankle.     Compression Bandaging added 1/2 " foam half circles to double foam at posterior ankle area  and muti layer short stretch bandages.                  PT Short Term Goals - 01/04/18 1200      PT SHORT TERM GOAL #1   Title  Pt volume to decrease 2 cm to allow pt to walk without feeling like she is having to drag her right leg.     Time  2    Period  Weeks    Status  Achieved      PT SHORT TERM GOAL #2   Title  Pt to verbalize that she is completing her exercises at home to increae lymph circulation to decrease volume.     Time  2    Period  Weeks    Status  Achieved        PT Long Term Goals - 01/04/18 1200      PT LONG TERM GOAL #1   Title  Pt volume to have decreased by 4 cm to reduce risk of celllulitis  Time  4    Period  Weeks    Status  Partially Met      PT LONG TERM GOAL #2   Title  Pt to be able to don and doff her shoes and socks with greater ease (be able to fit into)     Time  4    Period  Weeks    Status  Partially Met   able to when bandaging is off     PT LONG TERM GOAL #3   Title  PT to have obtained and be using the compression pump  for maintenance aspect of treatment.     Time  4    Period  Weeks    Status  On-going      PT LONG TERM GOAL #4   Title  Pt to have obtained and be able to don and doff compression garment.    Time  4    Period  Weeks    Status  On-going            Plan - 01/04/18 1156    Clinical Impression Statement  Pt slightly smaller today with bandages still intact.   Pt still has not ordered her garments but is going to today.  She will need continued therapy until she receives her garments.     Rehab Potential  Good    PT Frequency  3x / week    PT Duration  6 weeks    PT Treatment/Interventions  Manual lymph drainage;Manual techniques;Compression bandaging;Patient/family education    PT Next Visit Plan  continue for one-two more weeks to allow pt to acquire pump and garment prior to discharge. follow up on order and expected arrival of garments.     PT Home Exercise Plan  LE exerciss as well as diaphragmic breathing.     Consulted and Agree with Plan of Care  Patient;Family member/caregiver       Patient will benefit from skilled therapeutic intervention in order to improve the following deficits and impairments:  Decreased activity tolerance, Difficulty walking, Pain, Increased edema, Decreased range of motion  Visit Diagnosis: Lymphedema, not elsewhere classified     Problem List Patient Active Problem List   Diagnosis Date Noted  . Tachycardia-bradycardia ([place]) 10/23/2014  . Midsternal chest pain 09/24/2014  . Chest pain 09/22/2014  . CAD (coronary artery disease) 09/22/2014  . Atrial fibrillation with controlled ventricular response ([place]) 09/22/2014  . HTN (hypertension) 09/22/2014  . Diabetes mellitus type 2, insulin dependent ([place]) 09/22/2014  . Pacemaker - MDT ADDR01 Implanted: 05/31/08 Serial# [id number] 09/22/2014  . History of CHF (congestive heart failure) 09/22/2014  . Acute renal failure ([place]) 09/22/2014  . Gout 09/22/2014  . Abnormal EKG    Teena Irani,  PTA/CLT [phone] 01/05/2018, 10:03 AM Rayetta Humphrey, PT CLT [place] [address] [place], [place], [address] Phone: [phone]   Fax:  [phone]  Name: DESHONDA CRYDERMAN MRN: [id number] Date of Birth: 04/29/1933

## 2018-01-06 ENCOUNTER — Ambulatory Visit (HOSPITAL_COMMUNITY): Payer: Medicare Other | Admitting: Physical Therapy

## 2018-01-06 ENCOUNTER — Other Ambulatory Visit: Payer: Self-pay

## 2018-01-06 DIAGNOSIS — I890 Lymphedema, not elsewhere classified: Secondary | ICD-10-CM | POA: Diagnosis not present

## 2018-01-06 NOTE — Therapy (Signed)
[place], [place], [address] Phone: [phone]   Fax:  [phone]  Physical Therapy Treatment  Patient Details  Name: Megan Walsh MRN: [id number] Date of Birth: 1932/07/14 Referring Provider: Carlyle Dolly    Encounter Date: 01/06/2018  PT End of Session - 01/06/18 1039    Visit Number  19    Number of Visits  30    Date for PT Re-Evaluation  01/13/18    Authorization Type  cert 4/16-60/6    Authorization - Visit Number  32    Authorization - Number of Visits  30    PT Start Time  3016    PT Stop Time  1150    PT Time Calculation (min)  75 min    Activity Tolerance  Patient tolerated treatment well    Behavior During Therapy  [facility] for tasks assessed/performed       Past Medical History:  Diagnosis Date  . Anxiety   . Arthritis   . CHF (congestive heart failure) ([place])   . Coronary artery disease   . Diabetes mellitus without complication ([place])   . Dysrhythmia    a-fib  . Fatty tumor   . History of gout   . Hypertension   . Myocardial infarction ([place])   . Permanent atrial fibrillation ([place])   . Presence of permanent cardiac pacemaker    MDT ADDR01/Implanted: 05/31/08  . Sleep apnea    cannot tolerate CPAP  . Symptomatic bradycardia     Past Surgical History:  Procedure Laterality Date  . ABDOMINAL HYSTERECTOMY    . CATARACT EXTRACTION W/PHACO Left 10/17/2017   Procedure: CATARACT EXTRACTION PHACO AND INTRAOCULAR LENS PLACEMENT LEFT EYE;  Surgeon: Tonny Branch, MD;  Location: [facility];  Service: Ophthalmology;  Laterality: Left;  CDE: 11.64  . CATARACT EXTRACTION W/PHACO Right 10/31/2017   Procedure: CATARACT EXTRACTION PHACO AND INTRAOCULAR LENS PLACEMENT RIGHT EYE;  Surgeon: Tonny Branch, MD;  Location: [facility];  Service: Ophthalmology;  Laterality: Right;  CDE: 11.19  . fatty tumor removal to left arm Right   . PACEMAKER GENERATOR CHANGE  05/31/08   at [facility]: MDT ADDR01/Implanted: 05/31/08 as gen  change by Dr Reinaldo Berber  . PACEMAKER INSERTION  1992    There were no vitals filed for this visit.  Subjective Assessment - 01/06/18 1037    Subjective  PT has no complaints     Currently in Pain?  No/denies            [facility] Adult PT Treatment/Exercise - 01/06/18 0001      Manual Therapy   Manual Therapy  Manual Lymphatic Drainage (MLD);Compression Bandaging    Manual therapy comments  pre treated opposite LE using compression pump for 20 minutes. done seperate from all other aspects of treatment     Manual Lymphatic Drainage (MLD)  supraclavicular, deepa and superfical abdominal followed by routing fluid using inguinal/axillary anastomosis B and anterior as well as posterior aspect of LE.  Posterior done prone.  Therapist completet 50 strokes  at ankle and foot area to attempt to decreased congestion located around ankle.       Compression Bandaging  using 1/2 " foam and muti layer short stretch bandages.                PT Short Term Goals - 01/04/18 1200      PT SHORT TERM GOAL #1   Title  Pt volume to decrease 2 cm to allow pt  to walk without feeling like she is having to drag her right leg.     Time  2    Period  Weeks    Status  Achieved      PT SHORT TERM GOAL #2   Title  Pt to verbalize that she is completing her exercises at home to increae lymph circulation to decrease volume.     Time  2    Period  Weeks    Status  Achieved        PT Long Term Goals - 01/04/18 1200      PT LONG TERM GOAL #1   Title  Pt volume to have decreased by 4 cm to reduce risk of celllulitis     Time  4    Period  Weeks    Status  Partially Met      PT LONG TERM GOAL #2   Title  Pt to be able to don and doff her shoes and socks with greater ease (be able to fit into)     Time  4    Period  Weeks    Status  Partially Met   able to when bandaging is off     PT LONG TERM GOAL #3   Title  PT to have obtained and be using the compression pump for maintenance aspect of  treatment.     Time  4    Period  Weeks    Status  On-going      PT LONG TERM GOAL #4   Title  Pt to have obtained and be able to don and doff compression garment.    Time  4    Period  Weeks    Status  On-going            Plan - 01/06/18 1200    Clinical Impression Statement  Pt has ordered her compression garment as well as a butler and has been contacted by Waneta Martins; her pump will be sent out this week.  Used pump on patient this treatment. Both caregiver and pt had no quesitons or concerns about using the pump.  Pt volumes are stabilizing. If no significant decrease in size next week pt will be ready for discharge.      Clinical Presentation  Stable    Rehab Potential  Good    PT Frequency  3x / week    PT Duration  6 weeks    PT Treatment/Interventions  Manual lymph drainage;Manual techniques;Compression bandaging;Patient/family education    PT Next Visit Plan  continue for one-two more weeks to allow pt to acquire pump and  garment prior to discharge.    PT Home Exercise Plan  LE exerciss as well as diaphragmic breathing.     Consulted and Agree with Plan of Care  Patient;Family member/caregiver       Patient will benefit from skilled therapeutic intervention in order to improve the following deficits and impairments:  Decreased activity tolerance, Difficulty walking, Pain, Increased edema, Decreased range of motion  Visit Diagnosis: Lymphedema, not elsewhere classified     Problem List Patient Active Problem List   Diagnosis Date Noted  . Tachycardia-bradycardia ([place]) 10/23/2014  . Midsternal chest pain 09/24/2014  . Chest pain 09/22/2014  . CAD (coronary artery disease) 09/22/2014  . Atrial fibrillation with controlled ventricular response ([place]) 09/22/2014  . HTN (hypertension) 09/22/2014  . Diabetes mellitus type 2, insulin dependent ([place]) 09/22/2014  . Pacemaker - MDT ADDR01 Implanted: 05/31/08 Serial# [id number]  09/22/2014  . History of CHF (congestive  heart failure) 09/22/2014  . Acute renal failure ([place]) 09/22/2014  . Gout 09/22/2014  . Abnormal EKG     Rayetta Humphrey, [place] CLT [phone] 01/06/2018, 12:06 PM  [place] [address] [place], [place], [address] Phone: [phone]   Fax:  [phone]  Name: MARIJOSE CURINGTON MRN: [id number] Date of Birth: Oct 13, 1932

## 2018-01-10 ENCOUNTER — Ambulatory Visit (HOSPITAL_COMMUNITY): Payer: Medicare Other | Attending: Cardiology | Admitting: Physical Therapy

## 2018-01-10 DIAGNOSIS — I890 Lymphedema, not elsewhere classified: Secondary | ICD-10-CM | POA: Insufficient documentation

## 2018-01-10 NOTE — Therapy (Signed)
[place], [place], [address] Phone: [phone]   Fax:  [phone]  Physical Therapy Treatment  Patient Details  Name: Megan Walsh MRN: [id number] Date of Birth: [birth year] Referring Provider: Carlyle Dolly    Encounter Date: 01/10/2018  PT End of Session - 01/10/18 1433    Visit Number  20    Number of Visits  30    Date for PT Re-Evaluation  01/13/18    Authorization Type  cert 6/55-37/4    Authorization - Visit Number  20    Authorization - Number of Visits  30    PT Start Time  8270    PT Stop Time  1135    PT Time Calculation (min)  60 min    Activity Tolerance  Patient tolerated treatment well    Behavior During Therapy  [facility] for tasks assessed/performed       Past Medical History:  Diagnosis Date  . Anxiety   . Arthritis   . CHF (congestive heart failure) ([place])   . Coronary artery disease   . Diabetes mellitus without complication ([place])   . Dysrhythmia    a-fib  . Fatty tumor   . History of gout   . Hypertension   . Myocardial infarction ([place])   . Permanent atrial fibrillation ([place])   . Presence of permanent cardiac pacemaker    MDT ADDR01/Implanted: 05/31/08  . Sleep apnea    cannot tolerate CPAP  . Symptomatic bradycardia     Past Surgical History:  Procedure Laterality Date  . ABDOMINAL HYSTERECTOMY    . CATARACT EXTRACTION W/PHACO Left 10/17/2017   Procedure: CATARACT EXTRACTION PHACO AND INTRAOCULAR LENS PLACEMENT LEFT EYE;  Surgeon: Tonny Branch, MD;  Location: [facility];  Service: Ophthalmology;  Laterality: Left;  CDE: 11.64  . CATARACT EXTRACTION W/PHACO Right 10/31/2017   Procedure: CATARACT EXTRACTION PHACO AND INTRAOCULAR LENS PLACEMENT RIGHT EYE;  Surgeon: Tonny Branch, MD;  Location: [facility];  Service: Ophthalmology;  Laterality: Right;  CDE: 11.19  . fatty tumor removal to left arm Right   . PACEMAKER GENERATOR CHANGE  05/31/08   at [facility]: MDT ADDR01/Implanted: 05/31/08 as gen  change by Dr Reinaldo Berber  . PACEMAKER INSERTION  1992    There were no vitals filed for this visit.  Subjective Assessment - 01/10/18 1430    Subjective  Pt states her garment should be here tomorrow as they were to be mailed on Friday.  Pt reports still has not received her pump but knows how to do it when she gets it.     Currently in Pain?  No/denies                       [facility] Adult PT Treatment/Exercise - 01/10/18 0001      Manual Therapy   Manual Therapy  Manual Lymphatic Drainage (MLD);Compression Bandaging    Manual therapy comments  for bilateral LE's, done  seperately from all other skilled interventions    Manual Lymphatic Drainage (MLD)  supraclavicular, deepa and superfical abdominal followed by routing fluid using inguinal/axillary anastomosis B and anterior as well as posterior aspect of LE.    Compression Bandaging  using 1/2 " foam and muti layer short stretch bandages.                PT Short Term Goals - 01/04/18 1200      PT SHORT TERM GOAL #1   Title  Pt  volume to decrease 2 cm to allow pt to walk without feeling like she is having to drag her right leg.     Time  2    Period  Weeks    Status  Achieved      PT SHORT TERM GOAL #2   Title  Pt to verbalize that she is completing her exercises at home to increae lymph circulation to decrease volume.     Time  2    Period  Weeks    Status  Achieved        PT Long Term Goals - 01/04/18 1200      PT LONG TERM GOAL #1   Title  Pt volume to have decreased by 4 cm to reduce risk of celllulitis     Time  4    Period  Weeks    Status  Partially Met      PT LONG TERM GOAL #2   Title  Pt to be able to don and doff her shoes and socks with greater ease (be able to fit into)     Time  4    Period  Weeks    Status  Partially Met   able to when bandaging is off     PT LONG TERM GOAL #3   Title  PT to have obtained and be using the compression pump for maintenance aspect of treatment.      Time  4    Period  Weeks    Status  On-going      PT LONG TERM GOAL #4   Title  Pt to have obtained and be able to don and doff compression garment.    Time  4    Period  Weeks    Status  On-going            Plan - 01/10/18 1434    Clinical Impression Statement  contiued with manual for bilateral LE's. Pt able to verbalize how to use compression pump and feels confident in being able to use pump and place garments on.  PT to be measured next session for change in volume.      Rehab Potential  Good    PT Frequency  3x / week    PT Duration  6 weeks    PT Treatment/Interventions  Manual lymph drainage;Manual techniques;Compression bandaging;Patient/family education    PT Next Visit Plan  Remeasure next session and determing need to continue or if ready for discharge.     PT Home Exercise Plan  LE exerciss as well as diaphragmic breathing.     Consulted and Agree with Plan of Care  Patient;Family member/caregiver       Patient will benefit from skilled therapeutic intervention in order to improve the following deficits and impairments:  Decreased activity tolerance, Difficulty walking, Pain, Increased edema, Decreased range of motion  Visit Diagnosis: Lymphedema, not elsewhere classified     Problem List Patient Active Problem List   Diagnosis Date Noted  . Tachycardia-bradycardia ([place]) 10/23/2014  . Midsternal chest pain 09/24/2014  . Chest pain 09/22/2014  . CAD (coronary artery disease) 09/22/2014  . Atrial fibrillation with controlled ventricular response ([place]) 09/22/2014  . HTN (hypertension) 09/22/2014  . Diabetes mellitus type 2, insulin dependent ([place]) 09/22/2014  . Pacemaker - MDT ADDR01 Implanted: 05/31/08 Serial# [id number] 09/22/2014  . History of CHF (congestive heart failure) 09/22/2014  . Acute renal failure ([place]) 09/22/2014  . Gout 09/22/2014  . Abnormal EKG  Teena Irani, PTA/CLT [phone]   Teena Irani 01/10/2018, 2:36 PM  [facility] [address] [place], [place], [address] Phone: [phone]   Fax:  [phone]  Name: Megan Walsh MRN: [id number] Date of Birth: July 04, 1932

## 2018-01-12 ENCOUNTER — Ambulatory Visit (HOSPITAL_COMMUNITY): Payer: Medicare Other | Admitting: Physical Therapy

## 2018-01-12 ENCOUNTER — Telehealth (HOSPITAL_COMMUNITY): Payer: Self-pay | Admitting: Physical Therapy

## 2018-01-12 DIAGNOSIS — I890 Lymphedema, not elsewhere classified: Secondary | ICD-10-CM

## 2018-01-12 NOTE — Therapy (Signed)
[place], [place], [address] Phone: [phone]   Fax:  [phone]  Physical Therapy Treatment  Patient Details  Name: Megan Walsh MRN: [id number] Date of Birth: Sep 20, 1932 Referring Provider: Carlyle Dolly   Encounter Date: 01/12/2018   PHYSICAL THERAPY DISCHARGE SUMMARY  Visits from Start of Care: 21  Current functional level related to goals / functional outcomes: Pt volumes down, wearing compression garments   Remaining deficits: none   Education / Equipment: Use of compression garment and pump. Plan: Patient agrees to discharge.  Patient goals were met. Patient is being discharged due to meeting the stated rehab goals.  ?????      PT End of Session - 01/12/18 1419    Visit Number  21    Number of Visits  21    Date for PT Re-Evaluation  01/13/18    Authorization Type  cert 2/56-38/9    Authorization - Visit Number  21    Authorization - Number of Visits  30    PT Start Time  1300    PT Stop Time  1415    PT Time Calculation (min)  75 min    Activity Tolerance  Patient tolerated treatment well    Behavior During Therapy  WFL for tasks assessed/performed       Past Medical History:  Diagnosis Date  . Anxiety   . Arthritis   . CHF (congestive heart failure) ([place])   . Coronary artery disease   . Diabetes mellitus without complication ([place])   . Dysrhythmia    a-fib  . Fatty tumor   . History of gout   . Hypertension   . Myocardial infarction ([place])   . Permanent atrial fibrillation ([place])   . Presence of permanent cardiac pacemaker    MDT ADDR01/Implanted: 05/31/08  . Sleep apnea    cannot tolerate CPAP  . Symptomatic bradycardia     Past Surgical History:  Procedure Laterality Date  . ABDOMINAL HYSTERECTOMY    . CATARACT EXTRACTION W/PHACO Left 10/17/2017   Procedure: CATARACT EXTRACTION PHACO AND INTRAOCULAR LENS PLACEMENT LEFT EYE;  Surgeon: Tonny Branch, MD;  Location: [facility];  Service: Ophthalmology;  Laterality: Left;  CDE: 11.64  . CATARACT EXTRACTION W/PHACO Right 10/31/2017   Procedure: CATARACT EXTRACTION PHACO AND INTRAOCULAR LENS PLACEMENT RIGHT EYE;  Surgeon: Tonny Branch, MD;  Location: [facility];  Service: Ophthalmology;  Laterality: Right;  CDE: 11.19  . fatty tumor removal to left arm Right   . PACEMAKER GENERATOR CHANGE  05/31/08   at [facility]: MDT ADDR01/Implanted: 05/31/08 as gen change by Dr Reinaldo Berber  . PACEMAKER INSERTION  1992    There were no vitals filed for this visit.  Subjective Assessment - 01/12/18 1417    Subjective  Pt states she has brought her compression garment.  She has not recieved her pump yet but expects it any day now.      Currently in Pain?  No/denies         [facility] PT Assessment - 01/12/18 0001      Assessment   Medical Diagnosis  B Lymphedma    Referring Provider  Carlyle Dolly    Onset Date/Surgical Date  --   chronic   Next MD Visit  11/27/2017    Prior Therapy  none      Precautions   Precautions  Other (comment)    Precaution Comments  cellulitis      Restrictions   Weight  Bearing Restrictions  No      Prior Function   Level of Independence  Independent      Cognition   Overall Cognitive Status  Within Functional Limits for tasks assessed        LYMPHEDEMA/ONCOLOGY QUESTIONNAIRE - 01/12/18 1254      Right Lower Extremity Lymphedema   20 cm Proximal to Suprapatella  --   initally was 54.5   10 cm Proximal to Suprapatella  --   initally was 47.5   At Midpatella/Popliteal Crease  46 cm   46   30 cm Proximal to Floor at Lateral Plantar Foot  36 cm   42.5   20 cm Proximal to Floor at Lateral Plantar Foot  30 1   38.5   10 cm Proximal to Floor at Lateral Malleoli  29 cm   37.5   Circumference of ankle/heel  37 cm.   40.7   5 cm Proximal to 1st MTP Joint  25 cm   28.3   Across MTP Joint  25.3 cm   27.8   Around Proximal Great Toe  9 cm   10     Left Lower Extremity Lymphedema   20 cm  Proximal to Suprapatella  --   inital eval 53.8   10 cm Proximal to Suprapatella  --   46.7   At Midpatella/Popliteal Crease  41 cm   43.5   30 cm Proximal to Floor at Lateral Plantar Foot  37 cm   42.3   20 cm Proximal to Floor at Lateral Plantar Foot  30.5 cm   38.5   10 cm Proximal to Floor at Lateral Malleoli  28.9 cm   33.8   Circumference of ankle/heel  37.3 cm.   41   5 cm Proximal to 1st MTP Joint  26 cm   29.2   Across MTP Joint  26 cm   27.6   Around Proximal Great Toe  10 cm   10.4                [facility] Adult PT Treatment/Exercise - 01/12/18 0001      Manual Therapy   Manual Therapy  Manual Lymphatic Drainage (MLD);Compression Bandaging    Manual therapy comments  for bilateral LE's, done  seperately from all other skilled interventions    Manual Lymphatic Drainage (MLD)  supraclavicular, deepa and superfical abdominal followed by routing fluid using inguinal/axillary anastomosis B and anterior as well as posterior aspect of LE.    Compression Bandaging  educationon donning and doffing compression garment with butler              PT Education - 01/12/18 1419    Education Details  using butler to Timmothy Sours and doff compression garment    Person(s) Educated  Patient;Caregiver(s)    Methods  Explanation    Comprehension  Verbalized understanding;Returned demonstration       PT Short Term Goals - 01/12/18 1318      PT SHORT TERM GOAL #1   Title  Pt volume to decrease 2 cm to allow pt to walk without feeling like she is having to drag her right leg.     Time  2    Period  Weeks    Status  Achieved      PT SHORT TERM GOAL #2   Title  Pt to verbalize that she is completing her exercises at home to increae lymph circulation to decrease volume.     Time  2    Period  Weeks    Status  Achieved        PT Long Term Goals - 01/12/18 1318      PT LONG TERM GOAL #1   Title  Pt volume to have decreased by 4 cm to reduce risk of celllulitis     Time  4     Period  Weeks    Status  Achieved      PT LONG TERM GOAL #2   Title  Pt to be able to don and doff her shoes and socks with greater ease (be able to fit into)     Time  4    Period  Weeks    Status  Achieved   able to when bandaging is off     PT LONG TERM GOAL #3   Title  PT to have obtained and be using the compression pump for maintenance aspect of treatment.     Time  4    Period  Weeks    Status  Partially [place] called 10 days ago and said that it would be at pt house in 10 days.       PT LONG TERM GOAL #4   Title  Pt to have obtained and be able to don and doff compression garment.    Time  4    Period  Weeks    Status  Achieved            Plan - 01/12/18 1420    Clinical Impression Statement  PT remeasured.  He LE continue to decrease but at a much slower rate.  Pt states that she feels that she is ready for discharge as she is happy with her results.     Rehab Potential  Good    PT Frequency  3x / week    PT Duration  6 weeks    PT Treatment/Interventions  Manual lymph drainage;Manual techniques;Compression bandaging;Patient/family education    PT Next Visit Plan  discharge pt to self care.     PT Home Exercise Plan  LE exerciss as well as diaphragmic breathing.     Consulted and Agree with Plan of Care  Patient;Family member/caregiver       Patient will benefit from skilled therapeutic intervention in order to improve the following deficits and impairments:  Decreased activity tolerance, Difficulty walking, Pain, Increased edema, Decreased range of motion  Visit Diagnosis: Lymphedema, not elsewhere classified     Problem List Patient Active Problem List   Diagnosis Date Noted  . Tachycardia-bradycardia ([place]) 10/23/2014  . Midsternal chest pain 09/24/2014  . Chest pain 09/22/2014  . CAD (coronary artery disease) 09/22/2014  . Atrial fibrillation with controlled ventricular response ([place]) 09/22/2014  . HTN (hypertension) 09/22/2014  .  Diabetes mellitus type 2, insulin dependent ([place]) 09/22/2014  . Pacemaker - MDT ADDR01 Implanted: 05/31/08 Serial# [id number] 09/22/2014  . History of CHF (congestive heart failure) 09/22/2014  . Acute renal failure ([place]) 09/22/2014  . Gout 09/22/2014  . Abnormal EKG    Rayetta Humphrey, PT CLT [phone] 01/12/2018, 2:24 PM  [facility] [address] [place], [place], [address] Phone: [phone]   Fax:  [phone]  Name: HAZLEY DEZEEUW MRN: [id number] Date of Birth: May 07, 1933

## 2018-01-12 NOTE — Telephone Encounter (Signed)
DC per [facility]

## 2018-01-16 ENCOUNTER — Encounter (HOSPITAL_COMMUNITY): Payer: Medicare Other | Admitting: Physical Therapy

## 2018-01-18 ENCOUNTER — Encounter (HOSPITAL_COMMUNITY): Payer: Medicare Other | Admitting: Physical Therapy

## 2018-01-20 ENCOUNTER — Encounter (HOSPITAL_COMMUNITY): Payer: Medicare Other | Admitting: Physical Therapy

## 2018-01-23 ENCOUNTER — Encounter (HOSPITAL_COMMUNITY): Payer: Medicare Other | Admitting: Physical Therapy

## 2018-01-30 ENCOUNTER — Other Ambulatory Visit: Payer: Self-pay | Admitting: Cardiology

## 2018-02-02 DIAGNOSIS — H02831 Dermatochalasis of right upper eyelid: Secondary | ICD-10-CM | POA: Diagnosis not present

## 2018-02-02 DIAGNOSIS — H02834 Dermatochalasis of left upper eyelid: Secondary | ICD-10-CM | POA: Diagnosis not present

## 2018-02-03 DIAGNOSIS — Z23 Encounter for immunization: Secondary | ICD-10-CM | POA: Diagnosis not present

## 2018-02-13 ENCOUNTER — Telehealth: Payer: Self-pay

## 2018-02-13 ENCOUNTER — Encounter: Payer: Medicare Other | Admitting: *Deleted

## 2018-02-13 NOTE — Telephone Encounter (Signed)
LMOVM reminding pt to send remote transmission.   

## 2018-02-22 ENCOUNTER — Encounter: Payer: Self-pay | Admitting: Cardiology

## 2018-03-01 ENCOUNTER — Ambulatory Visit: Payer: Medicare Other | Admitting: Cardiology

## 2018-03-07 ENCOUNTER — Encounter (HOSPITAL_COMMUNITY): Payer: Self-pay

## 2018-03-07 ENCOUNTER — Encounter (HOSPITAL_COMMUNITY)
Admission: RE | Admit: 2018-03-07 | Discharge: 2018-03-07 | Disposition: A | Discharge status: Home / Self Care | Payer: Medicare Other | Source: Ambulatory Visit | Attending: Ophthalmology | Admitting: Ophthalmology

## 2018-03-07 NOTE — Patient Instructions (Signed)
Megan Walsh  03/07/2018     @PREFPERIOPPHARMACY @   Your procedure is scheduled on  03/10/2018   Report to [facility] at  1110  A.M.  Call this number if you have problems the morning of surgery:  [phone]   Remember:  Do not eat or drink after midnight.                        Take these medicines the morning of surgery with A SIP OF WATER  Allopurinol, gabapentin, lisinopril, metoprolol.    Do not wear jewelry, make-up or nail polish.  Do not wear lotions, powders, or perfumes, or deodorant.  Do not shave 48 hours prior to surgery.  Men may shave face and neck.  Do not bring valuables to the hospital.  [facility] is not responsible for any belongings or valuables.  Contacts, dentures or bridgework may not be worn into surgery.  Leave your suitcase in the car.  After surgery it may be brought to your room.  For patients admitted to the hospital, discharge time will be determined by your treatment team.  Patients discharged the day of surgery will not be allowed to drive home.   Name and phone number of your driver:   family Special instructions:  None  Please read over the following fact sheets that you were given. Anesthesia Post-op Instructions and Care and Recovery After Surgery      Blepharoplasty Blepharoplasty is a type of eyelid surgery to remove loose and droopy skin from around the eyes. Puffy bags above and below the eyes can also be removed with this procedure. Puffiness may be caused by fat deposits or loose muscles around your eyes. Your skin becomes less stretchy (elastic) as you age. You may have blepharoplasty on your upper eyelids, lower eyelids, or both. You may have this surgery to improve your appearance. This procedure may also be done if sagging upper eyelids interfere with your vision. Tell a health care provider about:  Any allergies you have.  All medicines you are taking, including vitamins, herbs, eye drops, creams,  and over-the-counter medicines.  Any problems you or family members have had with anesthetic medicines.  Any blood disorders you have.  Any surgeries you have had.  Any medical conditions you have. What are the risks? Generally, this is a safe procedure. However, problems may occur, including:  Swelling and bruising.  Bleeding.  Infection.  Dryness.  Trouble closing your eyes.  Eyelids that roll outward.  Scarring.  The need for more surgery.  Changes in your vision.  What happens before the procedure?  Ask your health care provider about: ? Changing or stopping your regular medicines. This is especially important if you are taking diabetes medicines or blood thinners. ? Taking medicines such as aspirin and ibuprofen. These medicines can thin your blood. Do not take these medicines before your procedure if your health care provider instructs you not to.  Follow instructions from your health care provider about eating or drinking restrictions.  Do not drink alcohol.  Do not use any tobacco products, including cigarettes, chewing tobacco, or electronic cigarettes. If you need help quitting, ask your health care provider.  Plan to have someone take you home after the procedure.  If you go home right after the procedure, plan to have someone with you for 24 hours. What happens during the procedure?  An IV tube  will be inserted into one of your veins.  Your surgeon may mark your eyelids to indicate where the incisions should be made.  You will be given one or more of the following: ? A medicine that helps you relax (sedative). ? A medicine that numbs the area (local anesthetic). ? A medicine that makes you fall asleep (general anesthetic).  Your surgeon will make incisions in the natural creases in the skin that is above or below your eyes. ? If you are having upper and lower blepharoplasty, incisions will be made above and below. ? If you are having lower lid  blepharoplasty that does not require skin removal, an incision may be made just inside your lower eyelids (transconjunctival incision).  Any fat deposits or excess skin will be removed. Loose muscle tissue may be trimmed or tightened.  Your surgeon will close the incisions with very fine stitches (sutures), a type of surgical glue, or tiny adhesive strips. Transconjunctival incisions are closed with sutures that dissolve as your body heals (absorbable sutures).  Eye drops may be placed in your eye, and ointment may be put over your incisions. The procedure may vary among health care providers and hospitals. What happens after the procedure?  Your blood pressure, heart rate, breathing rate, and blood oxygen level will be monitored often until the medicines you were given have worn off.  You may have to use eye drops or ointment after your procedure. This information is not intended to replace advice given to you by your health care provider. Make sure you discuss any questions you have with your health care provider. Document Released: 04/20/2001 Document Revised: 10/02/2015 Document Reviewed: 04/17/2014 Elsevier Interactive Patient Education  2018 [place] After Refer to this sheet in the next few weeks. These instructions provide you with information about caring for yourself after your procedure. Your health care provider may also give you more specific instructions. Your treatment has been planned according to current medical practices, but problems sometimes occur. Call your health care provider if you have any problems or questions after your procedure. What can I expect after the procedure? After the procedure, it is common to have:  Swelling.  Bruising.  Soreness.  Sticky, dry, and itchy eyes.  Follow these instructions at home: Medicines  Take medicines only as directed by your health care provider.  Use eye drops or ointment as directed by your  health care provider. Incision care  Do not soak or wash your face until your health care provider says that you can. Follow instructions from your health care provider about bathing.  There are many different ways to close and cover an incision, including stitches (sutures), skin glue, and adhesive strips. Follow instructions from your health care provider about: ? Incision care. ? Incision closure removal.  If directed, apply ice to the eye area to help reduce swelling and soreness: ? Put ice in a plastic bag. ? Place a towel between your skin and the bag. ? Leave the ice on for 20 minutes, 2-3 times per day. Activity  Use a few pillows to keep your head raised while you are sleeping or resting.  Do not bend over. Bending over causes your head to be lower than your heart.  Do not do any activities that require a lot of effort or energy (strenuous activities) until your health care provider approves.  Do not lift anything that is heavier than 10 lb (4.5 kg). General instructions  Do not use any tobacco  products, including cigarettes, chewing tobacco, or electronic cigarettes. If you need help quitting, ask your health care provider.  To protect your eyes from the sun, wear dark sunglasses and a wide-brimmed hat. Do this until healing is complete. This helps to keep the suture areas from becoming discolored.  Keep all follow-up visits as directed by your health care provider. This is important. Contact a health care provider if:  You have a fever.  You have dryness, pain, swelling, or bruising that is not getting better.  You have fluid, blood, or pus coming from your incision.  You cannot close your eyes completely.  Your eyeball is bulging, or your eyeball position is different from normal.  You have double vision or blurry vision that is not getting better.  You have any change in your vision. This information is not intended to replace advice given to you by your  health care provider. Make sure you discuss any questions you have with your health care provider. Document Released: 11/13/2004 Document Revised: 10/02/2015 Document Reviewed: 04/17/2014 Elsevier Interactive Patient Education  2018 [place] Anesthesia is a term that refers to techniques, procedures, and medicines that help a person stay safe and comfortable during a medical procedure. Monitored anesthesia care, or sedation, is one type of anesthesia. Your anesthesia specialist may recommend sedation if you will be having a procedure that does not require you to be unconscious, such as:  Cataract surgery.  A dental procedure.  A biopsy.  A colonoscopy.  During the procedure, you may receive a medicine to help you relax (sedative). There are three levels of sedation:  Mild sedation. At this level, you may feel awake and relaxed. You will be able to follow directions.  Moderate sedation. At this level, you will be sleepy. You may not remember the procedure.  Deep sedation. At this level, you will be asleep. You will not remember the procedure.  The more medicine you are given, the deeper your level of sedation will be. Depending on how you respond to the procedure, the anesthesia specialist may change your level of sedation or the type of anesthesia to fit your needs. An anesthesia specialist will monitor you closely during the procedure. Let your health care provider know about:  Any allergies you have.  All medicines you are taking, including vitamins, herbs, eye drops, creams, and over-the-counter medicines.  Any use of steroids (by mouth or as a cream).  Any problems you or family members have had with sedatives and anesthetic medicines.  Any blood disorders you have.  Any surgeries you have had.  Any medical conditions you have, such as sleep apnea.  Whether you are pregnant or may be pregnant.  Any use of cigarettes, alcohol, or street  drugs. What are the risks? Generally, this is a safe procedure. However, problems may occur, including:  Getting too much medicine (oversedation).  Nausea.  Allergic reaction to medicines.  Trouble breathing. If this happens, a breathing tube may be used to help with breathing. It will be removed when you are awake and breathing on your own.  Heart trouble.  Lung trouble.  Before the procedure Staying hydrated Follow instructions from your health care provider about hydration, which may include:  Up to 2 hours before the procedure - you may continue to drink clear liquids, such as water, clear fruit juice, black coffee, and plain tea.  Eating and drinking restrictions Follow instructions from your health care provider about eating and drinking, which  may include:  8 hours before the procedure - stop eating heavy meals or foods such as meat, fried foods, or fatty foods.  6 hours before the procedure - stop eating light meals or foods, such as toast or cereal.  6 hours before the procedure - stop drinking milk or drinks that contain milk.  2 hours before the procedure - stop drinking clear liquids.  Medicines Ask your health care provider about:  Changing or stopping your regular medicines. This is especially important if you are taking diabetes medicines or blood thinners.  Taking medicines such as aspirin and ibuprofen. These medicines can thin your blood. Do not take these medicines before your procedure if your health care provider instructs you not to.  Tests and exams  You will have a physical exam.  You may have blood tests done to show: ? How well your kidneys and liver are working. ? How well your blood can clot.  General instructions  Plan to have someone take you home from the hospital or clinic.  If you will be going home right after the procedure, plan to have someone with you for 24 hours.  What happens during the procedure?  Your blood pressure,  heart rate, breathing, level of pain and overall condition will be monitored.  An IV tube will be inserted into one of your veins.  Your anesthesia specialist will give you medicines as needed to keep you comfortable during the procedure. This may mean changing the level of sedation.  The procedure will be performed. After the procedure  Your blood pressure, heart rate, breathing rate, and blood oxygen level will be monitored until the medicines you were given have worn off.  Do not drive for 24 hours if you received a sedative.  You may: ? Feel sleepy, clumsy, or nauseous. ? Feel forgetful about what happened after the procedure. ? Have a sore throat if you had a breathing tube during the procedure. ? Vomit. This information is not intended to replace advice given to you by your health care provider. Make sure you discuss any questions you have with your health care provider. Document Released: 01/20/2005 Document Revised: 10/03/2015 Document Reviewed: 08/17/2015 Elsevier Interactive Patient Education  2018 [place], Care After These instructions provide you with information about caring for yourself after your procedure. Your health care provider may also give you more specific instructions. Your treatment has been planned according to current medical practices, but problems sometimes occur. Call your health care provider if you have any problems or questions after your procedure. What can I expect after the procedure? After your procedure, it is common to:  Feel sleepy for several hours.  Feel clumsy and have poor balance for several hours.  Feel forgetful about what happened after the procedure.  Have poor judgment for several hours.  Feel nauseous or vomit.  Have a sore throat if you had a breathing tube during the procedure.  Follow these instructions at home: For at least 24 hours after the procedure:   Do not: ? Participate in  activities in which you could fall or become injured. ? Drive. ? Use heavy machinery. ? Drink alcohol. ? Take sleeping pills or medicines that cause drowsiness. ? Make important decisions or sign legal documents. ? Take care of children on your own.  Rest. Eating and drinking  Follow the diet that is recommended by your health care provider.  If you vomit, drink water, juice, or soup when you can  drink without vomiting.  Make sure you have little or no nausea before eating solid foods. General instructions  Have a responsible adult stay with you until you are awake and alert.  Take over-the-counter and prescription medicines only as told by your health care provider.  If you smoke, do not smoke without supervision.  Keep all follow-up visits as told by your health care provider. This is important. Contact a health care provider if:  You keep feeling nauseous or you keep vomiting.  You feel light-headed.  You develop a rash.  You have a fever. Get help right away if:  You have trouble breathing. This information is not intended to replace advice given to you by your health care provider. Make sure you discuss any questions you have with your health care provider. Document Released: 08/17/2015 Document Revised: 12/17/2015 Document Reviewed: 08/17/2015 Elsevier Interactive Patient Education  [employer].

## 2018-03-08 ENCOUNTER — Ambulatory Visit (INDEPENDENT_AMBULATORY_CARE_PROVIDER_SITE_OTHER): Payer: Medicare Other | Admitting: *Deleted

## 2018-03-08 DIAGNOSIS — I5032 Chronic diastolic (congestive) heart failure: Secondary | ICD-10-CM

## 2018-03-08 DIAGNOSIS — I495 Sick sinus syndrome: Secondary | ICD-10-CM

## 2018-03-08 NOTE — Progress Notes (Signed)
Remote pacemaker transmission.   

## 2018-03-10 ENCOUNTER — Ambulatory Visit (HOSPITAL_COMMUNITY): Payer: Medicare Other | Admitting: Anesthesiology

## 2018-03-10 ENCOUNTER — Encounter (HOSPITAL_COMMUNITY): Payer: Self-pay | Admitting: Anesthesiology

## 2018-03-10 ENCOUNTER — Ambulatory Visit (HOSPITAL_COMMUNITY)
Admission: RE | Admit: 2018-03-10 | Discharge: 2018-03-10 | Disposition: A | Discharge status: Home / Self Care | Payer: Medicare Other | Source: Ambulatory Visit | Attending: Ophthalmology | Admitting: Ophthalmology

## 2018-03-10 ENCOUNTER — Encounter (HOSPITAL_COMMUNITY)
Admission: RE | Disposition: A | Discharge status: Home / Self Care | Payer: Self-pay | Source: Ambulatory Visit | Attending: Ophthalmology

## 2018-03-10 DIAGNOSIS — I509 Heart failure, unspecified: Secondary | ICD-10-CM | POA: Diagnosis not present

## 2018-03-10 DIAGNOSIS — Z7901 Long term (current) use of anticoagulants: Secondary | ICD-10-CM | POA: Diagnosis not present

## 2018-03-10 DIAGNOSIS — M1990 Unspecified osteoarthritis, unspecified site: Secondary | ICD-10-CM | POA: Diagnosis not present

## 2018-03-10 DIAGNOSIS — G4730 Sleep apnea, unspecified: Secondary | ICD-10-CM | POA: Diagnosis not present

## 2018-03-10 DIAGNOSIS — I4891 Unspecified atrial fibrillation: Secondary | ICD-10-CM | POA: Diagnosis not present

## 2018-03-10 DIAGNOSIS — I2510 Atherosclerotic heart disease of native coronary artery without angina pectoris: Secondary | ICD-10-CM | POA: Insufficient documentation

## 2018-03-10 DIAGNOSIS — H02834 Dermatochalasis of left upper eyelid: Secondary | ICD-10-CM | POA: Insufficient documentation

## 2018-03-10 DIAGNOSIS — F419 Anxiety disorder, unspecified: Secondary | ICD-10-CM | POA: Diagnosis not present

## 2018-03-10 DIAGNOSIS — Z87891 Personal history of nicotine dependence: Secondary | ICD-10-CM | POA: Insufficient documentation

## 2018-03-10 DIAGNOSIS — Z7982 Long term (current) use of aspirin: Secondary | ICD-10-CM | POA: Diagnosis not present

## 2018-03-10 DIAGNOSIS — H02831 Dermatochalasis of right upper eyelid: Secondary | ICD-10-CM | POA: Insufficient documentation

## 2018-03-10 DIAGNOSIS — Z950 Presence of cardiac pacemaker: Secondary | ICD-10-CM | POA: Diagnosis not present

## 2018-03-10 DIAGNOSIS — E139 Other specified diabetes mellitus without complications: Secondary | ICD-10-CM | POA: Diagnosis not present

## 2018-03-10 DIAGNOSIS — I252 Old myocardial infarction: Secondary | ICD-10-CM | POA: Insufficient documentation

## 2018-03-10 DIAGNOSIS — I110 Hypertensive heart disease with heart failure: Secondary | ICD-10-CM | POA: Insufficient documentation

## 2018-03-10 DIAGNOSIS — Z79899 Other long term (current) drug therapy: Secondary | ICD-10-CM | POA: Insufficient documentation

## 2018-03-10 HISTORY — PX: BROW LIFT: SHX178

## 2018-03-10 SURGERY — BLEPHAROPLASTY
Anesthesia: Monitor Anesthesia Care | Laterality: Bilateral

## 2018-03-10 MED ORDER — FENTANYL CITRATE (PF) 100 MCG/2ML IJ SOLN
INTRAMUSCULAR | Status: DC | PRN
  Administered 2018-03-10: 25 ug via INTRAVENOUS

## 2018-03-10 MED ORDER — LACTATED RINGERS IV SOLN
INTRAVENOUS | Status: DC
  Administered 2018-03-10: 12:00:00 via INTRAVENOUS

## 2018-03-10 MED ORDER — TETRACAINE HCL 0.5 % OP SOLN
OPHTHALMIC | Status: AC
  Filled 2018-03-10: qty 4

## 2018-03-10 MED ORDER — PROPOFOL 10 MG/ML IV BOLUS
INTRAVENOUS | Status: DC | PRN
  Administered 2018-03-10: 20 mg via INTRAVENOUS
  Administered 2018-03-10: 5 mg via INTRAVENOUS
  Administered 2018-03-10: 10 mg via INTRAVENOUS
  Administered 2018-03-10: 5 mg via INTRAVENOUS
  Administered 2018-03-10: 10 mg via INTRAVENOUS
  Administered 2018-03-10 (×3): 5 mg via INTRAVENOUS

## 2018-03-10 MED ORDER — LIDOCAINE HCL (PF) 2 % IJ SOLN
INTRAMUSCULAR | Status: AC
  Filled 2018-03-10: qty 10

## 2018-03-10 MED ORDER — TETRACAINE HCL 0.5 % OP SOLN
OPHTHALMIC | Status: DC | PRN
  Administered 2018-03-10: 2 [drp] via OPHTHALMIC

## 2018-03-10 MED ORDER — 0.9 % SODIUM CHLORIDE (POUR BTL) OPTIME
TOPICAL | Status: DC | PRN
  Administered 2018-03-10: 1000 mL

## 2018-03-10 MED ORDER — ERYTHROMYCIN 5 MG/GM OP OINT
TOPICAL_OINTMENT | Freq: Three times a day (TID) | OPHTHALMIC | Status: DC
  Filled 2018-03-10: qty 3.5

## 2018-03-10 MED ORDER — ERYTHROMYCIN 5 MG/GM OP OINT
TOPICAL_OINTMENT | OPHTHALMIC | Status: DC | PRN
  Administered 2018-03-10: 1 via OPHTHALMIC

## 2018-03-10 MED ORDER — LIDOCAINE-EPINEPHRINE 1 %-1:200000 IJ SOLN
INTRAMUSCULAR | Status: AC
Start: 2018-03-10 — End: ?
  Filled 2018-03-10: qty 30

## 2018-03-10 MED ORDER — LIDOCAINE-EPINEPHRINE (PF) 1 %-1:200000 IJ SOLN
INTRAMUSCULAR | Status: DC | PRN
  Administered 2018-03-10: 3 mL

## 2018-03-10 SURGICAL SUPPLY — 13 items
BLADE SURG 15 STRL LF DISP TIS (BLADE) ×1 IMPLANT
BLADE SURG 15 STRL SS (BLADE) ×2
CAUTERY SURG HI TEMP FINE TP (MISCELLANEOUS) ×2 IMPLANT
CLOTH BEACON ORANGE TIMEOUT ST (SAFETY) ×3 IMPLANT
COVER LIGHT HANDLE STERIS (MISCELLANEOUS) ×6 IMPLANT
GAUZE SPONGE 4X4 12PLY STRL (GAUZE/BANDAGES/DRESSINGS) ×3 IMPLANT
GLOVE BIOGEL PI IND STRL 7.0 (GLOVE) IMPLANT
GLOVE BIOGEL PI INDICATOR 7.0 (GLOVE) ×4
KIT BLADEGUARD II DBL (SET/KITS/TRAYS/PACK) ×3 IMPLANT
MARKER SKIN DUAL TIP RULER LAB (MISCELLANEOUS) ×3 IMPLANT
NDL HYPO 27GX1-1/4 (NEEDLE) IMPLANT
NEEDLE HYPO 27GX1-1/4 (NEEDLE) ×3 IMPLANT
SUT 6 0 PLAIN (SUTURE) ×5 IMPLANT

## 2018-03-10 NOTE — Anesthesia Postprocedure Evaluation (Signed)
Anesthesia Post Note  Patient: Megan Walsh  Procedure(s) Performed: BLEPHAROPLASTY UPPER LID (Bilateral )  Patient location during evaluation: Short Stay Anesthesia Type: MAC Level of consciousness: awake and patient cooperative Pain management: pain level controlled Vital Signs Assessment: post-procedure vital signs reviewed and stable Respiratory status: spontaneous breathing, nonlabored ventilation and respiratory function stable Cardiovascular status: blood pressure returned to baseline Postop Assessment: no apparent nausea or vomiting Anesthetic complications: no     Last Vitals:  Vitals:   03/10/18 1126  BP: (!) 145/72  Pulse: 61  Resp: 18  Temp: 36.8 C  SpO2: 94%    Last Pain:  Vitals:   03/10/18 1126  TempSrc: Oral  PainSc: 0-No pain                 Panda Crossin J

## 2018-03-10 NOTE — Op Note (Signed)
Procedure Date: March 10, 2018  Preoperative Diagnosis: Visually significant dermatochalasis, both eyes   Postoperative Diagnosis:  Visually significant dermatochalasis, both eyes   Procedure Performed:  Bilateral upper lid blepharoplasty    Surgeon: Baruch Goldmann, M.D.  Assistants: None  Anesthesia:  1. Local infiltrative 2% lidocaine with epinephrine 1:100,000 2. MAC  Specimens: None   Estimated Blood Loss: Less than 1 mL   Complications: None    Indications for Surgery: Because of the visually significant dermatochalasis of both eyes, it was recommended to do bilateral upper lid blepharoplasty. The common risks and benefits of surgery were discussed, including inability to close the eyes, over-correction, under-correction, asymmetry, corneal exposure, double vision, blindnessand undesired cosmetic result.   Procedure:   The patient was brought back to the operating room and was placed in the supine position. The patient was prepped and draped in the usual sterile fashion. Calipers were used to measure 7 mm above the upper lash line in both eyes. A forceps pinch technique was used to carefully measure the amount of skin to be taken off both eyes and care was taken that no lagophthalmos would likely result. From that point a surgical marking pen was used to mark the appropriate amount of skin to be taken from both eyes. Both surgical sites were then injected subcutaneously with local anesthetic (1.3% lidocaine with epinephrine 1:100,000). After adequate anesthesia was found to be achieved, attention was turned to the left side where a 15 blade was used to incise the skin along the premarked lines. After this was achieved, the cutaneous layer was removed using Westcott scissors and forceps.  Hemostasis was achieved with electocautery.  Three interrupted fast absorbing 6-0 gut sutures were placed. A running 6-0 gut suture was used to close the skin. After this was achieved, attention was  turned to the right eye where a similar procedure was performed, including using a 15 blade to incise the skin, using Westcott scissors and forceps to remove the skin, cautery, and using 6-0 gut sutures, first interrupted and then running, to finish the wound.  After the wound was closed and adequate hemostasis was found to be achieved, the drape was removed and patient was cleaned with wet and dry 4x4s, and erythromycin ointment was applied to both suture lines. Ice packs were placed over both eyes.   Post-Op Plan/Instructions: A prescription for erythromycin ointment was given. An instruction sheet was given. Ice packs are to be used for 48 hours 20 minutes on and 20 minutes off while awake for 2 days. Antibiotic ointment used for 10 days. A follow up visit will in two weeks in clinic. Patient instructed to call if signs of bleeding, infection, significant pain, or any other concerning symptoms  Disposition: Home under self care.

## 2018-03-10 NOTE — Transfer of Care (Signed)
Immediate Anesthesia Transfer of Care Note  Patient: Megan Walsh  Procedure(s) Performed: BLEPHAROPLASTY UPPER LID (Bilateral )  Patient Location: Short Stay  Anesthesia Type:MAC  Level of Consciousness: awake and patient cooperative  Airway & Oxygen Therapy: Patient Spontanous Breathing  Post-op Assessment: Report given to RN and Post -op Vital signs reviewed and stable  Post vital signs: Reviewed and stable  Last Vitals:  Vitals Value Taken Time  BP    Temp    Pulse    Resp    SpO2      Last Pain:  Vitals:   03/10/18 1126  TempSrc: Oral  PainSc: 0-No pain      Patients Stated Pain Goal: 5 (09/62/83 6629)  Complications: No apparent anesthesia complications

## 2018-03-10 NOTE — Discharge Instructions (Addendum)
Please discharge patient when stable, will follow up today with Dr. Marisa Hua at the [facility] office in 2 weeks.  Written instructions for post-operative care given to patient.   Blepharoplasty Blepharoplasty is a type of eyelid surgery to remove loose and droopy skin from around the eyes. Puffy bags above and below the eyes can also be removed with this procedure. Puffiness may be caused by fat deposits or loose muscles around your eyes. Your skin becomes less stretchy (elastic) as you age. You may have blepharoplasty on your upper eyelids, lower eyelids, or both. You may have this surgery to improve your appearance. This procedure may also be done if sagging upper eyelids interfere with your vision. Tell a health care provider about:  Any allergies you have.  All medicines you are taking, including vitamins, herbs, eye drops, creams, and over-the-counter medicines.  Any problems you or family members have had with anesthetic medicines.  Any blood disorders you have.  Any surgeries you have had.  Any medical conditions you have. What are the risks? Generally, this is a safe procedure. However, problems may occur, including:  Swelling and bruising.  Bleeding.  Infection.  Dryness.  Trouble closing your eyes.  Eyelids that roll outward.  Scarring.  The need for more surgery.  Changes in your vision.  What happens before the procedure?  Ask your health care provider about: ? Changing or stopping your regular medicines. This is especially important if you are taking diabetes medicines or blood thinners. ? Taking medicines such as aspirin and ibuprofen. These medicines can thin your blood. Do not take these medicines before your procedure if your health care provider instructs you not to.  Follow instructions from your health care provider about eating or drinking restrictions.  Do not drink alcohol.  Do not use any tobacco products, including cigarettes, chewing  tobacco, or electronic cigarettes. If you need help quitting, ask your health care provider.  Plan to have someone take you home after the procedure.  If you go home right after the procedure, plan to have someone with you for 24 hours. What happens during the procedure?  An IV tube will be inserted into one of your veins.  Your surgeon may mark your eyelids to indicate where the incisions should be made.  You will be given one or more of the following: ? A medicine that helps you relax (sedative). ? A medicine that numbs the area (local anesthetic). ? A medicine that makes you fall asleep (general anesthetic).  Your surgeon will make incisions in the natural creases in the skin that is above or below your eyes. ? If you are having upper and lower blepharoplasty, incisions will be made above and below. ? If you are having lower lid blepharoplasty that does not require skin removal, an incision may be made just inside your lower eyelids (transconjunctival incision).  Any fat deposits or excess skin will be removed. Loose muscle tissue may be trimmed or tightened.  Your surgeon will close the incisions with very fine stitches (sutures), a type of surgical glue, or tiny adhesive strips. Transconjunctival incisions are closed with sutures that dissolve as your body heals (absorbable sutures).  Eye drops may be placed in your eye, and ointment may be put over your incisions. The procedure may vary among health care providers and hospitals. What happens after the procedure?  Your blood pressure, heart rate, breathing rate, and blood oxygen level will be monitored often until the medicines you were given  have worn off.  You may have to use eye drops or ointment after your procedure. This information is not intended to replace advice given to you by your health care provider. Make sure you discuss any questions you have with your health care provider. Document Released: 04/20/2001 Document  Revised: 10/02/2015 Document Reviewed: 04/17/2014 Elsevier Interactive Patient Education  2018 [place] POST-ANESTHESIA  IMMEDIATELY FOLLOWING SURGERY:  Do not drive or operate machinery for the first twenty four hours after surgery.  Do not make any important decisions for twenty four hours after surgery or while taking narcotic pain medications or sedatives.  If you develop intractable nausea and vomiting or a severe headache please notify your doctor immediately.  FOLLOW-UP:  Please make an appointment with your surgeon as instructed. You do not need to follow up with anesthesia unless specifically instructed to do so.  WOUND CARE INSTRUCTIONS (if applicable):  Keep a dry clean dressing on the anesthesia/puncture wound site if there is drainage.  Once the wound has quit draining you may leave it open to air.  Generally you should leave the bandage intact for twenty four hours unless there is drainage.  If the epidural site drains for more than 36-48 hours please call the anesthesia department.  QUESTIONS?:  Please feel free to call your physician or the hospital operator if you have any questions, and they will be happy to assist you.

## 2018-03-10 NOTE — H&P (Signed)
The H and P was reviewed and updated. The patient was examined.  No changes were found after exam.  The surgical eye was marked.  

## 2018-03-10 NOTE — Anesthesia Preprocedure Evaluation (Signed)
Anesthesia Evaluation  Patient identified by MRN, date of birth, ID band Patient awake    Reviewed: Allergy & Precautions, NPO status , Patient's Chart, lab work & pertinent test results  Airway Mallampati: II  TM Distance: >3 FB Neck ROM: Full    Dental no notable dental hx. (+) Teeth Intact   Pulmonary sleep apnea , former smoker,  Unable to tolerate CPAP for a while    Pulmonary exam normal breath sounds clear to auscultation       Cardiovascular Exercise Tolerance: Poor hypertension, + CAD, + Past MI and +CHF  Normal cardiovascular exam+ dysrhythmias Atrial Fibrillation + pacemaker II Rhythm:Regular Rate:Normal  Denies CP states ET limited by weakness Has a Pacer / last change was 2014 States gets regular f/u  D/w MD -plans only heat cautery - no Electrocautery planned    Neuro/Psych Anxiety negative neurological ROS  negative psych ROS   GI/Hepatic negative GI ROS, Neg liver ROS,   Endo/Other  negative endocrine ROSdiabetesDiet controlled DM  Renal/GU Renal InsufficiencyRenal disease  negative genitourinary   Musculoskeletal  (+) Arthritis ,   Abdominal   Peds negative pediatric ROS (+)  Hematology negative hematology ROS (+)   Anesthesia Other Findings   Reproductive/Obstetrics negative OB ROS                             Anesthesia Physical Anesthesia Plan  ASA: III  Anesthesia Plan: MAC   Post-op Pain Management:    Induction:   PONV Risk Score and Plan:   Airway Management Planned: Nasal Cannula and Simple Face Mask  Additional Equipment:   Intra-op Plan:   Post-operative Plan:   Informed Consent:   Plan Discussed with:   Anesthesia Plan Comments: (S/p 2 eye surgs under MAC in 10/2017 without issues )        Anesthesia Quick Evaluation

## 2018-03-13 ENCOUNTER — Encounter: Payer: Self-pay | Admitting: *Deleted

## 2018-03-13 ENCOUNTER — Ambulatory Visit (INDEPENDENT_AMBULATORY_CARE_PROVIDER_SITE_OTHER): Payer: Medicare Other | Admitting: Cardiology

## 2018-03-13 ENCOUNTER — Encounter (HOSPITAL_COMMUNITY): Payer: Self-pay | Admitting: Ophthalmology

## 2018-03-13 VITALS — BP 146/77 | HR 87 | Ht 65.0 in | Wt 170.0 lb

## 2018-03-13 DIAGNOSIS — I5032 Chronic diastolic (congestive) heart failure: Secondary | ICD-10-CM | POA: Diagnosis not present

## 2018-03-13 DIAGNOSIS — I890 Lymphedema, not elsewhere classified: Secondary | ICD-10-CM

## 2018-03-13 NOTE — Progress Notes (Signed)
Clinical Summary Ms. Kenagy is a 84 y.o.female seen today for follow up of the following medical problems. This is a focused visit on her history of chronic diastolic HF, lymphedema, and recent leg swelling.   1. Chronic diastolic HF/Lymphedema/Leg swelling - admit to [facility] 07/2017 with fluid overload -07/2017 echo showed LVEF 55-60%. Diuresed with improved symptoms  - completed lymphedema clinic in 01/2018. Has home compression stockings and pump.  - swelling is much improved. Remains on lasix  AM 40mg , and 20mg  at 12pm.    Past Medical History:  Diagnosis Date  . Anxiety   . Arthritis   . CHF (congestive heart failure) ([place])   . Coronary artery disease   . Diabetes mellitus without complication ([place])   . Dysrhythmia    a-fib  . Fatty tumor   . History of gout   . Hypertension   . Myocardial infarction ([place])   . Permanent atrial fibrillation   . Presence of permanent cardiac pacemaker    MDT ADDR01/Implanted: 05/31/08  . Sleep apnea    cannot tolerate CPAP  . Symptomatic bradycardia      Allergies  Allergen Reactions  . Atorvastatin Swelling and Cough     Current Outpatient Medications  Medication Sig Dispense Refill  . acetaminophen (TYLENOL) 650 MG CR tablet Take 650 mg by mouth 2 (two) times daily as needed for pain.     Marland Kitchen allopurinol (ZYLOPRIM) 300 MG tablet Take 300 mg by mouth daily.    Marland Kitchen amoxicillin (AMOXIL) 500 MG capsule Take 2,000 mg by mouth See admin instructions. Take 2000 mg by mouth 1 hour prior to dental appointment    . ascorbic acid (VITAMIN C) 500 MG tablet Take 500 mg by mouth daily.    Marland Kitchen docusate sodium (COLACE) 100 MG capsule Take 1 capsule (100 mg total) by mouth every 12 (twelve) hours. (Patient taking differently: Take 100 mg by mouth daily as needed for moderate constipation. ) 20 capsule 0  . DUREZOL 0.05 % EMUL Place 1 drop into the left eye See admin instructions. Begin after surgery. Place 1 drop in left eye 3 times daily and  continue as directed  1  . ELIQUIS 5 MG TABS tablet TAKE ONE TABLET BY MOUTH TWICE DAILY 60 tablet 3  . furosemide (LASIX) 20 MG tablet Take 40 mg by mouth 2 (two) times daily.     Marland Kitchen gabapentin (NEURONTIN) 100 MG capsule Take 100 mg by mouth 2 (two) times daily.    Marland Kitchen ketorolac (ACULAR) 0.4 % SOLN Place 1 drop into the left eye See admin instructions. Begin 3 days prior to surgery. Place 1 drop in left eye 4 times daily, 1 drop morning of surgery and continue as directed  1  . lisinopril (PRINIVIL,ZESTRIL) 20 MG tablet Take 1 tablet (20 mg total) by mouth daily. 30 tablet 11  . metoprolol succinate (TOPROL-XL) 25 MG 24 hr tablet TAKE ONE TABLET BY MOUTH EVERY DAY 90 tablet 1  . moxifloxacin (VIGAMOX) 0.5 % ophthalmic solution Place 1 drop into the left eye See admin instructions. Begin 3 days prior to surgery. Place 1 drop in left eye 3 times daily, the morning of surgery and continue for 1 week  1  . potassium chloride SA (K-DUR,KLOR-CON) 20 MEQ tablet Take 20 mEq by mouth daily.    . simvastatin (ZOCOR) 40 MG tablet Take 40 mg by mouth daily.     No current facility-administered medications for this visit.  Past Surgical History:  Procedure Laterality Date  . ABDOMINAL HYSTERECTOMY    . BROW LIFT Bilateral 03/10/2018   Procedure: BLEPHAROPLASTY BILATERAL UPPER EYELIDS;  Surgeon: Baruch Goldmann, MD;  Location: [facility];  Service: Ophthalmology;  Laterality: Bilateral;  . CATARACT EXTRACTION W/PHACO Left 10/17/2017   Procedure: CATARACT EXTRACTION PHACO AND INTRAOCULAR LENS PLACEMENT LEFT EYE;  Surgeon: Tonny , MD;  Location: [facility];  Service: Ophthalmology;  Laterality: Left;  CDE: 11.64  . CATARACT EXTRACTION W/PHACO Right 10/31/2017   Procedure: CATARACT EXTRACTION PHACO AND INTRAOCULAR LENS PLACEMENT RIGHT EYE;  Surgeon: Tonny , MD;  Location: [facility];  Service: Ophthalmology;  Laterality: Right;  CDE: 11.19  . fatty tumor removal to left arm Right   . PACEMAKER GENERATOR CHANGE   05/31/08   at [facility]: MDT ADDR01/Implanted: 05/31/08 as gen change by Dr Reinaldo Berber  . PACEMAKER INSERTION  1992     Allergies  Allergen Reactions  . Atorvastatin Swelling and Cough      Family History  Problem Relation Age of Onset  . Heart failure Mother      Social History Ms. Nowakowski reports that she quit smoking about 43 years ago. Her smoking use included cigarettes. She started smoking about 73 years ago. She has a 30.00 pack-year smoking history. She has never used smokeless tobacco. Ms. Scism reports that she does not drink alcohol.   Review of Systems CONSTITUTIONAL: No weight loss, fever, chills, weakness or fatigue.  HEENT: Eyes: No visual loss, blurred vision, double vision or yellow sclerae.No hearing loss, sneezing, congestion, runny nose or sore throat.  SKIN: No rash or itching.  CARDIOVASCULAR: no chest pain, no palpitations.  RESPIRATORY: No shortness of breath, cough or sputum.  GASTROINTESTINAL: No anorexia, nausea, vomiting or diarrhea. No abdominal pain or blood.  GENITOURINARY: No burning on urination, no polyuria NEUROLOGICAL: No headache, dizziness, syncope, paralysis, ataxia, numbness or tingling in the extremities. No change in bowel or bladder control.  MUSCULOSKELETAL: No muscle, back pain, joint pain or stiffness.  LYMPHATICS: No enlarged nodes. No history of splenectomy.  PSYCHIATRIC: No history of depression or anxiety.  ENDOCRINOLOGIC: No reports of sweating, cold or heat intolerance. No polyuria or polydipsia.  Marland Kitchen   Physical Examination Vitals:   03/13/18 1043  BP: (!) 146/77  Pulse: 87   Vitals:   03/13/18 1043  Weight: 170 lb (77.1 kg)  Height: 5\' 5"  (1.651 m)    Gen: resting comfortably, no acute distress HEENT: no scleral icterus, pupils equal round and reactive, no palptable cervical adenopathy,  CV: RRR, no m/r/g, no jvd Resp: Clear to auscultation bilaterally GI: abdomen is soft, non-tender, non-distended, normal bowel  sounds, no hepatosplenomegaly MSK: extremities are warm, 1+ bilateral LE edema nonpitting Skin: warm, no rash Neuro:  no focal deficits Psych: appropriate affect   Diagnostic Studies 09/2014 echo Study Conclusions  - Left ventricle: The cavity size was normal. There was severe asymmetric hypertrophy of the septum. Systolic function was normal. The estimated ejection fraction was in the range of 60% to 65%. Wall motion was normal; there were no regional wall motion abnormalities. - Aortic valve: There was mild regurgitation. - Mitral valve: There was mild regurgitation directed centrally. - Left atrium: The atrium was moderately to severely dilated. - Tricuspid valve: There was moderate regurgitation. - Pulmonary arteries: Systolic pressure was mildly increased. PA peak pressure: 41 mm Hg (S).  Impressions:  - Appearance is consistent with hypertrophic cardiomyopathy with no outflow tract obstruction (at rest).  Assessment and Plan  1.Chronic diastolic HF/ LE edema/Lymphedema - swelling has improved after starting lymphedema compression treatments - continue current therapy. Repeat BMET/Mg on diuretics.     [name], M.D.

## 2018-03-13 NOTE — Patient Instructions (Signed)
Medication Instructions:  Continue all current medications.  Labwork:  BMET, Magnesium - orders given today.   Office will contact with results via phone or letter.    Testing/Procedures: none  Follow-Up: 4 months   Any Other Special Instructions Will Be Listed Below (If Applicable).  If you need a refill on your cardiac medications before your next appointment, please call your pharmacy.  

## 2018-03-14 DIAGNOSIS — I10 Essential (primary) hypertension: Secondary | ICD-10-CM | POA: Diagnosis not present

## 2018-03-16 DIAGNOSIS — Z299 Encounter for prophylactic measures, unspecified: Secondary | ICD-10-CM | POA: Diagnosis not present

## 2018-03-16 DIAGNOSIS — I10 Essential (primary) hypertension: Secondary | ICD-10-CM | POA: Diagnosis not present

## 2018-03-16 DIAGNOSIS — I4891 Unspecified atrial fibrillation: Secondary | ICD-10-CM | POA: Diagnosis not present

## 2018-03-16 DIAGNOSIS — Z6828 Body mass index (BMI) 28.0-28.9, adult: Secondary | ICD-10-CM | POA: Diagnosis not present

## 2018-03-16 DIAGNOSIS — E1165 Type 2 diabetes mellitus with hyperglycemia: Secondary | ICD-10-CM | POA: Diagnosis not present

## 2018-03-17 ENCOUNTER — Encounter: Payer: Self-pay | Admitting: Cardiology

## 2018-03-20 DIAGNOSIS — Z6828 Body mass index (BMI) 28.0-28.9, adult: Secondary | ICD-10-CM | POA: Diagnosis not present

## 2018-03-20 DIAGNOSIS — E1165 Type 2 diabetes mellitus with hyperglycemia: Secondary | ICD-10-CM | POA: Diagnosis not present

## 2018-03-20 DIAGNOSIS — I4891 Unspecified atrial fibrillation: Secondary | ICD-10-CM | POA: Diagnosis not present

## 2018-03-20 DIAGNOSIS — I10 Essential (primary) hypertension: Secondary | ICD-10-CM | POA: Diagnosis not present

## 2018-03-20 DIAGNOSIS — Z299 Encounter for prophylactic measures, unspecified: Secondary | ICD-10-CM | POA: Diagnosis not present

## 2018-03-20 DIAGNOSIS — M1711 Unilateral primary osteoarthritis, right knee: Secondary | ICD-10-CM | POA: Diagnosis not present

## 2018-03-22 ENCOUNTER — Telehealth: Payer: Self-pay | Admitting: *Deleted

## 2018-03-22 NOTE — Telephone Encounter (Signed)
-----   Message from [name], MD sent at 03/20/2018  4:04 PM EST ----- Labs look fine  Zandra Abts MD

## 2018-03-24 NOTE — Telephone Encounter (Signed)
Pt aware - routed to pcp  

## 2018-04-12 DIAGNOSIS — I10 Essential (primary) hypertension: Secondary | ICD-10-CM | POA: Diagnosis not present

## 2018-04-12 DIAGNOSIS — Z6828 Body mass index (BMI) 28.0-28.9, adult: Secondary | ICD-10-CM | POA: Diagnosis not present

## 2018-04-12 DIAGNOSIS — Z87891 Personal history of nicotine dependence: Secondary | ICD-10-CM | POA: Diagnosis not present

## 2018-04-12 DIAGNOSIS — J069 Acute upper respiratory infection, unspecified: Secondary | ICD-10-CM | POA: Diagnosis not present

## 2018-04-12 DIAGNOSIS — Z299 Encounter for prophylactic measures, unspecified: Secondary | ICD-10-CM | POA: Diagnosis not present

## 2018-05-07 LAB — CUP PACEART REMOTE DEVICE CHECK
Battery Remaining Longevity: 31 mo
Battery Voltage: 2.76 V
Brady Statistic RV Percent Paced: 83 %
Date Time Interrogation Session: 20191030184321
Implantable Lead Implant Date: 19980116
Implantable Lead Location: 753859
Implantable Lead Model: 5034
Implantable Pulse Generator Implant Date: 20100122
Lead Channel Setting Pacing Amplitude: 2.5 V
Lead Channel Setting Pacing Pulse Width: 0.4 ms
Lead Channel Setting Sensing Sensitivity: 4 mV
MDC IDC LEAD IMPLANT DT: 19980116
MDC IDC LEAD LOCATION: 753860
MDC IDC MSMT BATTERY IMPEDANCE: 2270 Ohm
MDC IDC MSMT LEADCHNL RA IMPEDANCE VALUE: 67 Ohm
MDC IDC MSMT LEADCHNL RV IMPEDANCE VALUE: 1244 Ohm
MDC IDC MSMT LEADCHNL RV PACING THRESHOLD AMPLITUDE: 0.375 V
MDC IDC MSMT LEADCHNL RV PACING THRESHOLD PULSEWIDTH: 0.4 ms

## 2018-05-28 ENCOUNTER — Other Ambulatory Visit: Payer: Self-pay | Admitting: Cardiology

## 2018-06-19 ENCOUNTER — Encounter: Payer: Self-pay | Admitting: Cardiology

## 2018-06-20 ENCOUNTER — Telehealth (HOSPITAL_COMMUNITY): Payer: Self-pay | Admitting: Physical Therapy

## 2018-06-20 DIAGNOSIS — I509 Heart failure, unspecified: Secondary | ICD-10-CM | POA: Diagnosis not present

## 2018-06-20 DIAGNOSIS — Z7902 Long term (current) use of antithrombotics/antiplatelets: Secondary | ICD-10-CM | POA: Diagnosis not present

## 2018-06-20 DIAGNOSIS — I110 Hypertensive heart disease with heart failure: Secondary | ICD-10-CM | POA: Diagnosis not present

## 2018-06-20 DIAGNOSIS — M1990 Unspecified osteoarthritis, unspecified site: Secondary | ICD-10-CM | POA: Diagnosis not present

## 2018-06-20 DIAGNOSIS — Z950 Presence of cardiac pacemaker: Secondary | ICD-10-CM | POA: Diagnosis not present

## 2018-06-20 DIAGNOSIS — R0602 Shortness of breath: Secondary | ICD-10-CM | POA: Diagnosis not present

## 2018-06-20 DIAGNOSIS — M109 Gout, unspecified: Secondary | ICD-10-CM | POA: Diagnosis not present

## 2018-06-20 DIAGNOSIS — R531 Weakness: Secondary | ICD-10-CM | POA: Diagnosis not present

## 2018-06-20 DIAGNOSIS — R0689 Other abnormalities of breathing: Secondary | ICD-10-CM | POA: Diagnosis not present

## 2018-06-20 DIAGNOSIS — I4891 Unspecified atrial fibrillation: Secondary | ICD-10-CM | POA: Diagnosis not present

## 2018-06-20 DIAGNOSIS — E119 Type 2 diabetes mellitus without complications: Secondary | ICD-10-CM | POA: Diagnosis not present

## 2018-06-20 DIAGNOSIS — R609 Edema, unspecified: Secondary | ICD-10-CM | POA: Diagnosis not present

## 2018-06-20 DIAGNOSIS — R55 Syncope and collapse: Secondary | ICD-10-CM | POA: Diagnosis not present

## 2018-06-20 DIAGNOSIS — Z87891 Personal history of nicotine dependence: Secondary | ICD-10-CM | POA: Diagnosis not present

## 2018-06-20 DIAGNOSIS — R42 Dizziness and giddiness: Secondary | ICD-10-CM | POA: Diagnosis not present

## 2018-06-20 DIAGNOSIS — Z79899 Other long term (current) drug therapy: Secondary | ICD-10-CM | POA: Diagnosis not present

## 2018-06-20 NOTE — Telephone Encounter (Signed)
Pt called states she has lymph and needs an apptment -Advised pt to call MD and get referral sent to our office, then we would schedule apptment. NF 06/20/2018

## 2018-06-21 DIAGNOSIS — I4891 Unspecified atrial fibrillation: Secondary | ICD-10-CM | POA: Diagnosis not present

## 2018-06-21 DIAGNOSIS — I10 Essential (primary) hypertension: Secondary | ICD-10-CM | POA: Diagnosis not present

## 2018-06-21 DIAGNOSIS — Z299 Encounter for prophylactic measures, unspecified: Secondary | ICD-10-CM | POA: Diagnosis not present

## 2018-06-21 DIAGNOSIS — E1165 Type 2 diabetes mellitus with hyperglycemia: Secondary | ICD-10-CM | POA: Diagnosis not present

## 2018-06-21 DIAGNOSIS — R55 Syncope and collapse: Secondary | ICD-10-CM | POA: Diagnosis not present

## 2018-06-21 DIAGNOSIS — Z87891 Personal history of nicotine dependence: Secondary | ICD-10-CM | POA: Diagnosis not present

## 2018-06-21 DIAGNOSIS — Z6826 Body mass index (BMI) 26.0-26.9, adult: Secondary | ICD-10-CM | POA: Diagnosis not present

## 2018-07-07 DIAGNOSIS — M79642 Pain in left hand: Secondary | ICD-10-CM | POA: Diagnosis not present

## 2018-07-07 DIAGNOSIS — I4891 Unspecified atrial fibrillation: Secondary | ICD-10-CM | POA: Diagnosis not present

## 2018-07-07 DIAGNOSIS — Z87891 Personal history of nicotine dependence: Secondary | ICD-10-CM | POA: Diagnosis not present

## 2018-07-07 DIAGNOSIS — W208XXA Other cause of strike by thrown, projected or falling object, initial encounter: Secondary | ICD-10-CM | POA: Diagnosis not present

## 2018-07-07 DIAGNOSIS — I509 Heart failure, unspecified: Secondary | ICD-10-CM | POA: Diagnosis not present

## 2018-07-07 DIAGNOSIS — E119 Type 2 diabetes mellitus without complications: Secondary | ICD-10-CM | POA: Diagnosis not present

## 2018-07-07 DIAGNOSIS — M109 Gout, unspecified: Secondary | ICD-10-CM | POA: Diagnosis not present

## 2018-07-07 DIAGNOSIS — S61412A Laceration without foreign body of left hand, initial encounter: Secondary | ICD-10-CM | POA: Diagnosis not present

## 2018-07-07 DIAGNOSIS — I110 Hypertensive heart disease with heart failure: Secondary | ICD-10-CM | POA: Diagnosis not present

## 2018-07-07 DIAGNOSIS — Z7901 Long term (current) use of anticoagulants: Secondary | ICD-10-CM | POA: Diagnosis not present

## 2018-07-07 DIAGNOSIS — Z79899 Other long term (current) drug therapy: Secondary | ICD-10-CM | POA: Diagnosis not present

## 2018-07-07 DIAGNOSIS — S61421A Laceration with foreign body of right hand, initial encounter: Secondary | ICD-10-CM | POA: Diagnosis not present

## 2018-07-14 DIAGNOSIS — Z4802 Encounter for removal of sutures: Secondary | ICD-10-CM | POA: Diagnosis not present

## 2018-07-20 ENCOUNTER — Ambulatory Visit (INDEPENDENT_AMBULATORY_CARE_PROVIDER_SITE_OTHER): Payer: Medicare Other | Admitting: Cardiology

## 2018-07-20 ENCOUNTER — Other Ambulatory Visit: Payer: Self-pay

## 2018-07-20 ENCOUNTER — Encounter: Payer: Self-pay | Admitting: Cardiology

## 2018-07-20 VITALS — BP 106/59 | HR 74 | Ht 65.0 in | Wt 168.0 lb

## 2018-07-20 DIAGNOSIS — Z6828 Body mass index (BMI) 28.0-28.9, adult: Secondary | ICD-10-CM | POA: Diagnosis not present

## 2018-07-20 DIAGNOSIS — Z299 Encounter for prophylactic measures, unspecified: Secondary | ICD-10-CM | POA: Diagnosis not present

## 2018-07-20 DIAGNOSIS — I5032 Chronic diastolic (congestive) heart failure: Secondary | ICD-10-CM | POA: Diagnosis not present

## 2018-07-20 DIAGNOSIS — E1165 Type 2 diabetes mellitus with hyperglycemia: Secondary | ICD-10-CM | POA: Diagnosis not present

## 2018-07-20 DIAGNOSIS — R42 Dizziness and giddiness: Secondary | ICD-10-CM | POA: Diagnosis not present

## 2018-07-20 DIAGNOSIS — I4891 Unspecified atrial fibrillation: Secondary | ICD-10-CM | POA: Diagnosis not present

## 2018-07-20 DIAGNOSIS — Z950 Presence of cardiac pacemaker: Secondary | ICD-10-CM

## 2018-07-20 DIAGNOSIS — I10 Essential (primary) hypertension: Secondary | ICD-10-CM | POA: Diagnosis not present

## 2018-07-20 NOTE — Progress Notes (Signed)
Clinical Summary Megan Walsh is a 85 y.o.female  1. Chronic diastolic HF/Lymphedema/Leg swelling - admit to [facility] 07/2017 with fluid overload -07/2017 echo showed LVEF 55-60%. Diuresed with improved symptoms  - completed lymphedema clinic in 01/2018. Has home compression stockings and pump.   - chronic LE edema ongoing. Taking lasix 40mg  bid Wears compression socks, using compression pump. Weight stable.      2. Permanent pacemaker -pacemaker followed in device clinic  - no recent symptoms. Due for device check in July  3. Afib - no recent symptoms. Compliant with meds, no bleeding on eliquis.    4. Hypertrophic CM - echo with severe asymmetric septal hypertrophy in 2016, no gradient. - no symptoms   5. HTN -bp meds previously lowered due to issues with low bp's - she remains compliant with meds  6. Hyperlipidemia -she is compliant with statin   Past Medical History:  Diagnosis Date   Anxiety    Arthritis    CHF (congestive heart failure) (H[facility])    Coronary artery disease    Diabetes mellitus without complication ([facility])    Dysrhythmia    a-fib   Fatty tumor    History of gout    Hypertension    Myocardial infarction [facility])    Permanent atrial fibrillation    Presence of permanent cardiac pacemaker    MDT ADDR01/Implanted: 05/31/08   Sleep apnea    cannot tolerate CPAP   Symptomatic bradycardia      Allergies  Allergen Reactions   Atorvastatin Swelling and Cough     Current Outpatient Medications  Medication Sig Dispense Refill   acetaminophen (TYLENOL) 650 MG CR tablet Take 650 mg by mouth 2 (two) times daily as needed for pain.      allopurinol (ZYLOPRIM) 300 MG tablet Take 300 mg by mouth daily.     amoxicillin (AMOXIL) 500 MG capsule Take 2,000 mg by mouth See admin instructions. Take 2000 mg by mouth 1 hour prior to dental appointment     ascorbic acid (VITAMIN C) 500 MG tablet Take 500 mg by mouth daily.      docusate sodium (COLACE) 100 MG capsule Take 1 capsule (100 mg total) by mouth every 12 (twelve) hours. (Patient taking differently: Take 100 mg by mouth daily as needed for moderate constipation. ) 20 capsule 0   DUREZOL 0.05 % EMUL Place 1 drop into the left eye See admin instructions. Begin after surgery. Place 1 drop in left eye 3 times daily and continue as directed  1   ELIQUIS 5 MG TABS tablet TAKE 1 TABLET BY MOUTH TWICE DAILY 180 tablet 1   furosemide (LASIX) 20 MG tablet Take 40 mg by mouth 2 (two) times daily.      gabapentin (NEURONTIN) 100 MG capsule Take 100 mg by mouth 2 (two) times daily.     ketorolac (ACULAR) 0.4 % SOLN Place 1 drop into the left eye See admin instructions. Begin 3 days prior to surgery. Place 1 drop in left eye 4 times daily, 1 drop morning of surgery and continue as directed  1   lisinopril (PRINIVIL,ZESTRIL) 20 MG tablet Take 1 tablet (20 mg total) by mouth daily. 30 tablet 11   metoprolol succinate (TOPROL-XL) 25 MG 24 hr tablet TAKE 1 TABLET BY MOUTH EVERY DAY 90 tablet 1   moxifloxacin (VIGAMOX) 0.5 % ophthalmic solution Place 1 drop into the left eye See admin instructions. Begin 3 days prior to surgery. Place 1 drop in  left eye 3 times daily, the morning of surgery and continue for 1 week  1   potassium chloride SA (K-DUR,KLOR-CON) 20 MEQ tablet Take 20 mEq by mouth daily.     simvastatin (ZOCOR) 40 MG tablet Take 40 mg by mouth daily.     No current facility-administered medications for this visit.      Past Surgical History:  Procedure Laterality Date   ABDOMINAL HYSTERECTOMY     BROW LIFT Bilateral 03/10/2018   Procedure: BLEPHAROPLASTY BILATERAL UPPER EYELIDS;  Surgeon: Baruch Goldmann, MD;  Location: [facility];  Service: Ophthalmology;  Laterality: Bilateral;   CATARACT EXTRACTION W/PHACO Left 10/17/2017   Procedure: CATARACT EXTRACTION PHACO AND INTRAOCULAR LENS PLACEMENT LEFT EYE;  Surgeon: Tonny , MD;  Location: [facility];   Service: Ophthalmology;  Laterality: Left;  CDE: 11.64   CATARACT EXTRACTION W/PHACO Right 10/31/2017   Procedure: CATARACT EXTRACTION PHACO AND INTRAOCULAR LENS PLACEMENT RIGHT EYE;  Surgeon: Tonny , MD;  Location: [facility];  Service: Ophthalmology;  Laterality: Right;  CDE: 11.19   fatty tumor removal to left arm Right    PACEMAKER GENERATOR CHANGE  05/31/08   at [facility]: MDT ADDR01/Implanted: 05/31/08 as gen change by Dr Reinaldo Berber   PACEMAKER INSERTION  1992     Allergies  Allergen Reactions   Atorvastatin Swelling and Cough      Family History  Problem Relation Age of Onset   Heart failure Mother      Social History [name] reports that she quit smoking about 44 years ago. Her smoking use included cigarettes. She started smoking about 73 years ago. She has a 30.00 pack-year smoking history. She has never used smokeless tobacco. [name] reports no history of alcohol use.   Review of Systems CONSTITUTIONAL: No weight loss, fever, chills, weakness or fatigue.  HEENT: Eyes: No visual loss, blurred vision, double vision or yellow sclerae.No hearing loss, sneezing, congestion, runny nose or sore throat.  SKIN: No rash or itching.  CARDIOVASCULAR: per hpi RESPIRATORY: No shortness of breath, cough or sputum.  GASTROINTESTINAL: No anorexia, nausea, vomiting or diarrhea. No abdominal pain or blood.  GENITOURINARY: No burning on urination, no polyuria NEUROLOGICAL: No headache, dizziness, syncope, paralysis, ataxia, numbness or tingling in the extremities. No change in bowel or bladder control.  MUSCULOSKELETAL: No muscle, back pain, joint pain or stiffness.  LYMPHATICS: No enlarged nodes. No history of splenectomy.  PSYCHIATRIC: No history of depression or anxiety.  ENDOCRINOLOGIC: No reports of sweating, cold or heat intolerance. No polyuria or polydipsia.  Marland Kitchen   Physical Examination Today's Vitals   07/20/18 1057  BP: (!) 106/59  Pulse: 74  SpO2: 96%    Weight: 168 lb (76.2 kg)  Height: 5\' 5"  (1.651 m)   Body mass index is 27.96 kg/m.  Gen: resting comfortably, no acute distress HEENT: no scleral icterus, pupils equal round and reactive, no palptable cervical adenopathy,  CV: RRR, no m/r/g, no jvd Resp: Clear to auscultation bilaterally GI: abdomen is soft, non-tender, non-distended, normal bowel sounds, no hepatosplenomegaly MSK: extremities are warm, 2+ nonpitting edema Skin: warm, no rash Neuro:  no focal deficits Psych: appropriate affect   Diagnostic Studies  09/2014 echo Study Conclusions  - Left ventricle: The cavity size was normal. There was severe asymmetric hypertrophy of the septum. Systolic function was normal. The estimated ejection fraction was in the range of 60% to 65%. Wall motion was normal; there were no regional wall motion abnormalities. - Aortic valve: There was mild regurgitation. -  Mitral valve: There was mild regurgitation directed centrally. - Left atrium: The atrium was moderately to severely dilated. - Tricuspid valve: There was moderate regurgitation. - Pulmonary arteries: Systolic pressure was mildly increased. PA peak pressure: 41 mm Hg (S).  Impressions:  - Appearance is consistent with hypertrophic cardiomyopathy with no outflow tract obstruction (at rest).   Assessment and Plan  1.Chronic diastolic HF/LE edema/Lymphedema - stable weights, ongoing edema that is stable. Previous uptitration of diuretics led to AKI, I think remaining edema is lymphedema and will continue to treat with compression therapy.    2. Afib -no symptoms, she will continue current meds along with anticoag  3. Permanent pacemaker -device check in July  4. Hypertrophic CM - no symptoms, continue to monitor.   5. HTN - she is at goal, continue current meds  F/u 6 months    [name], M.D.

## 2018-07-20 NOTE — Patient Instructions (Signed)

## 2018-07-21 DIAGNOSIS — H40023 Open angle with borderline findings, high risk, bilateral: Secondary | ICD-10-CM | POA: Diagnosis not present

## 2018-08-23 ENCOUNTER — Other Ambulatory Visit: Payer: Self-pay | Admitting: Cardiology

## 2018-08-23 ENCOUNTER — Telehealth: Payer: Self-pay | Admitting: Cardiology

## 2018-08-23 DIAGNOSIS — Z1211 Encounter for screening for malignant neoplasm of colon: Secondary | ICD-10-CM | POA: Diagnosis not present

## 2018-08-23 DIAGNOSIS — E7800 Pure hypercholesterolemia, unspecified: Secondary | ICD-10-CM | POA: Diagnosis not present

## 2018-08-23 DIAGNOSIS — Z0000 Encounter for general adult medical examination without abnormal findings: Secondary | ICD-10-CM | POA: Diagnosis not present

## 2018-08-23 DIAGNOSIS — Z1331 Encounter for screening for depression: Secondary | ICD-10-CM | POA: Diagnosis not present

## 2018-08-23 DIAGNOSIS — Z1339 Encounter for screening examination for other mental health and behavioral disorders: Secondary | ICD-10-CM | POA: Diagnosis not present

## 2018-08-23 DIAGNOSIS — R5383 Other fatigue: Secondary | ICD-10-CM | POA: Diagnosis not present

## 2018-08-23 DIAGNOSIS — Z7189 Other specified counseling: Secondary | ICD-10-CM | POA: Diagnosis not present

## 2018-08-23 DIAGNOSIS — Z79899 Other long term (current) drug therapy: Secondary | ICD-10-CM | POA: Diagnosis not present

## 2018-08-23 DIAGNOSIS — Z6828 Body mass index (BMI) 28.0-28.9, adult: Secondary | ICD-10-CM | POA: Diagnosis not present

## 2018-08-23 DIAGNOSIS — E559 Vitamin D deficiency, unspecified: Secondary | ICD-10-CM | POA: Diagnosis not present

## 2018-08-23 DIAGNOSIS — I10 Essential (primary) hypertension: Secondary | ICD-10-CM | POA: Diagnosis not present

## 2018-08-23 DIAGNOSIS — Z299 Encounter for prophylactic measures, unspecified: Secondary | ICD-10-CM | POA: Diagnosis not present

## 2018-08-23 MED ORDER — METOPROLOL SUCCINATE ER 25 MG PO TB24: 25.0000 mg | ORAL_TABLET | Freq: Every day | ORAL | 6 refills | Status: DC

## 2018-08-23 MED ORDER — LISINOPRIL 20 MG PO TABS: 20.0000 mg | ORAL_TABLET | Freq: Every day | ORAL | 6 refills | Status: DC

## 2018-08-23 NOTE — Telephone Encounter (Signed)
°*  STAT* If patient is at the pharmacy, call can be transferred to refill team.   1. Which medications need to be refilled?   Metoprolol ER SUC 25MG   2. Which pharmacy/location (including street and city if local pharmacy) is medication to be sent to? [facility]  RX # [id number]  0. Do they need a 30 day or 90 day supply? [place]

## 2018-08-23 NOTE — Telephone Encounter (Signed)
°*  STAT* If patient is at the pharmacy, call can be transferred to refill team.   1. Which medications need to be refilled? lisinopril (PRINIVIL,ZESTRIL) 20 MG tablet    2. Which pharmacy/location (including street and city if local pharmacy) is medication to be sent to? [facility]  3. Do they need a 30 day or 90 day supply? [place]

## 2018-08-23 NOTE — Telephone Encounter (Signed)
Medication sent to pharmacy  

## 2018-08-23 NOTE — Telephone Encounter (Signed)
Done

## 2018-10-09 ENCOUNTER — Telehealth: Payer: Self-pay | Admitting: Cardiology

## 2018-10-09 NOTE — Telephone Encounter (Signed)
Patient called stating that the compression stockings she is wearing is not doing what they are suppose to do anymore. [phone])

## 2018-10-10 NOTE — Telephone Encounter (Signed)
Called pt to get more information. Advised her I will contact the lymphedema clinic to ask for direction. Pt voiced understanding.  I sent staff message to [place], PTA asking for advice.

## 2018-11-02 ENCOUNTER — Telehealth (HOSPITAL_COMMUNITY): Payer: Self-pay | Admitting: Physical Therapy

## 2018-11-02 ENCOUNTER — Telehealth: Payer: Self-pay | Admitting: Cardiology

## 2018-11-02 NOTE — Telephone Encounter (Signed)
Pump has broken part and she needs it fixed  Stated pump is used for swelling

## 2018-11-02 NOTE — Telephone Encounter (Signed)
Pt will contact lymphedema clinic regarding this since they were the ones to order pump compression for legs

## 2018-11-02 NOTE — Telephone Encounter (Signed)
Pt called stating her Lymph pump has broken and she wanted [place] to fix it. I advised pt that the pump equipment should have a phone number on it for her to call that number to report the issue. Will let Amy Mare Ferrari PTA know about this also.

## 2018-11-15 DIAGNOSIS — R600 Localized edema: Secondary | ICD-10-CM | POA: Diagnosis not present

## 2018-11-15 DIAGNOSIS — Z299 Encounter for prophylactic measures, unspecified: Secondary | ICD-10-CM | POA: Diagnosis not present

## 2018-11-15 DIAGNOSIS — E1165 Type 2 diabetes mellitus with hyperglycemia: Secondary | ICD-10-CM | POA: Diagnosis not present

## 2018-11-15 DIAGNOSIS — Z6826 Body mass index (BMI) 26.0-26.9, adult: Secondary | ICD-10-CM | POA: Diagnosis not present

## 2018-11-15 DIAGNOSIS — I4891 Unspecified atrial fibrillation: Secondary | ICD-10-CM | POA: Diagnosis not present

## 2018-11-15 DIAGNOSIS — I10 Essential (primary) hypertension: Secondary | ICD-10-CM | POA: Diagnosis not present

## 2018-11-17 ENCOUNTER — Encounter: Payer: Medicare Other | Admitting: Internal Medicine

## 2018-11-17 DIAGNOSIS — E11319 Type 2 diabetes mellitus with unspecified diabetic retinopathy without macular edema: Secondary | ICD-10-CM | POA: Diagnosis not present

## 2018-11-20 ENCOUNTER — Other Ambulatory Visit: Payer: Self-pay | Admitting: Cardiology

## 2018-12-05 ENCOUNTER — Telehealth: Payer: Self-pay

## 2018-12-05 ENCOUNTER — Ambulatory Visit (INDEPENDENT_AMBULATORY_CARE_PROVIDER_SITE_OTHER): Payer: Medicare Other | Admitting: *Deleted

## 2018-12-05 DIAGNOSIS — I495 Sick sinus syndrome: Secondary | ICD-10-CM | POA: Diagnosis not present

## 2018-12-05 LAB — CUP PACEART REMOTE DEVICE CHECK
Battery Impedance: 2706 Ohm
Battery Remaining Longevity: 27 mo
Battery Voltage: 2.75 V
Brady Statistic RV Percent Paced: 83 %
Date Time Interrogation Session: 20200728165741
Implantable Lead Implant Date: 19980116
Implantable Lead Implant Date: 19980116
Implantable Lead Location: 753859
Implantable Lead Location: 753860
Implantable Lead Model: 5034
Implantable Lead Model: 5534
Implantable Pulse Generator Implant Date: 20100122
Lead Channel Impedance Value: 1447 Ohm
Lead Channel Impedance Value: 67 Ohm
Lead Channel Pacing Threshold Amplitude: 0.5 V
Lead Channel Pacing Threshold Pulse Width: 0.4 ms
Lead Channel Setting Pacing Amplitude: 2.5 V
Lead Channel Setting Pacing Pulse Width: 0.4 ms
Lead Channel Setting Sensing Sensitivity: 4 mV

## 2018-12-05 NOTE — Telephone Encounter (Signed)
Transmission received. I told the pt I will let the nurse know we got the transmission. If everything looks normal the nurse will Not call back. If they nurse see anything she will get a call back. The pt verbalized understanding.

## 2018-12-05 NOTE — Telephone Encounter (Signed)
I spoke with Megan Walsh she is going to get someone to help the pt send a transmission with her home monitor. I gave my direct office number in case they needed help sending the transmission.

## 2018-12-05 NOTE — Telephone Encounter (Signed)
Transmission received and added to schedule for processing.

## 2018-12-08 ENCOUNTER — Telehealth (INDEPENDENT_AMBULATORY_CARE_PROVIDER_SITE_OTHER): Payer: Medicare Other | Admitting: Internal Medicine

## 2018-12-08 DIAGNOSIS — I10 Essential (primary) hypertension: Secondary | ICD-10-CM | POA: Diagnosis not present

## 2018-12-08 DIAGNOSIS — I4821 Permanent atrial fibrillation: Secondary | ICD-10-CM

## 2018-12-08 DIAGNOSIS — I5032 Chronic diastolic (congestive) heart failure: Secondary | ICD-10-CM | POA: Diagnosis not present

## 2018-12-08 DIAGNOSIS — I495 Sick sinus syndrome: Secondary | ICD-10-CM | POA: Diagnosis not present

## 2018-12-08 NOTE — Progress Notes (Signed)
Electrophysiology TeleHealth Note   Due to national recommendations of social distancing due to [place] 19, an audio telehealth visit is felt to be most appropriate for this patient at this time.  Verbal consent was obtained by me for the telehealth visit today.  The patient does not have capability for a virtual visit.  A phone visit is therefore required today.   Date:  12/08/2018   ID:  Megan Walsh, DOB 1932-12-27, MRN [id number]  Location: patient's home  Provider location:  [facility]  Evaluation Performed: Follow-up visit  PCP:  Monico Blitz, MD   Electrophysiologist:  Dr Rayann Heman  Chief Complaint:  Pacemaker follow up  History of Present Illness:    Megan Walsh is a 85 y.o. female who presents via telehealth conferencing today.  Since last being seen in our clinic, the patient reports doing very well.  Today, she denies symptoms of palpitations, chest pain, shortness of breath (above baseline), dizziness, presyncope, or syncope.  The patient is otherwise without complaint today.  The patient denies symptoms of fevers, chills, cough, or new SOB worrisome for COVID 19.  Past Medical History:  Diagnosis Date  . Anxiety   . Arthritis   . CHF (congestive heart failure) ([place])   . Coronary artery disease   . Diabetes mellitus without complication ([place])   . Dysrhythmia    a-fib  . Fatty tumor   . History of gout   . Hypertension   . Myocardial infarction ([place])   . Permanent atrial fibrillation   . Presence of permanent cardiac pacemaker    MDT ADDR01/Implanted: 05/31/08  . Sleep apnea    cannot tolerate CPAP  . Symptomatic bradycardia     Past Surgical History:  Procedure Laterality Date  . ABDOMINAL HYSTERECTOMY    . BROW LIFT Bilateral 03/10/2018   Procedure: BLEPHAROPLASTY BILATERAL UPPER EYELIDS;  Surgeon: Baruch Goldmann, MD;  Location: [facility];  Service: Ophthalmology;  Laterality: Bilateral;  . CATARACT EXTRACTION W/PHACO Left 10/17/2017   Procedure:  CATARACT EXTRACTION PHACO AND INTRAOCULAR LENS PLACEMENT LEFT EYE;  Surgeon: Tonny Branch, MD;  Location: [facility];  Service: Ophthalmology;  Laterality: Left;  CDE: 11.64  . CATARACT EXTRACTION W/PHACO Right 10/31/2017   Procedure: CATARACT EXTRACTION PHACO AND INTRAOCULAR LENS PLACEMENT RIGHT EYE;  Surgeon: Tonny Branch, MD;  Location: [facility];  Service: Ophthalmology;  Laterality: Right;  CDE: 11.19  . fatty tumor removal to left arm Right   . PACEMAKER GENERATOR CHANGE  05/31/08   at [facility]: MDT ADDR01/Implanted: 05/31/08 as gen change by Dr Reinaldo Berber  . PACEMAKER INSERTION  1992    Current Outpatient Medications  Medication Sig Dispense Refill  . acetaminophen (TYLENOL) 650 MG CR tablet Take 650 mg by mouth 2 (two) times daily as needed for pain.     Marland Kitchen allopurinol (ZYLOPRIM) 300 MG tablet Take 300 mg by mouth daily.    Marland Kitchen amoxicillin (AMOXIL) 500 MG capsule Take 2,000 mg by mouth See admin instructions. Take 2000 mg by mouth 1 hour prior to dental appointment    . ascorbic acid (VITAMIN C) 500 MG tablet Take 500 mg by mouth daily.    Marland Kitchen docusate sodium (COLACE) 100 MG capsule Take 1 capsule (100 mg total) by mouth every 12 (twelve) hours. (Patient taking differently: Take 100 mg by mouth daily as needed for moderate constipation. ) 20 capsule 0  . ELIQUIS 5 MG TABS tablet TAKE 1 TABLET BY MOUTH TWICE DAILY 180 tablet 0  .  furosemide (LASIX) 20 MG tablet Take 40 mg by mouth 2 (two) times daily.     Marland Kitchen gabapentin (NEURONTIN) 100 MG capsule Take 100 mg by mouth 2 (two) times daily.    Marland Kitchen lisinopril (PRINIVIL,ZESTRIL) 20 MG tablet Take 1 tablet (20 mg total) by mouth daily. 30 tablet 6  . metoprolol succinate (TOPROL-XL) 25 MG 24 hr tablet Take 1 tablet (25 mg total) by mouth daily. 30 tablet 6  . potassium chloride SA (K-DUR,KLOR-CON) 20 MEQ tablet Take 20 mEq by mouth daily.    . simvastatin (ZOCOR) 40 MG tablet Take 40 mg by mouth daily.     No current facility-administered medications for this visit.      Allergies:   Atorvastatin   Social History:  The patient  reports that she quit smoking about 44 years ago. Her smoking use included cigarettes. She started smoking about 74 years ago. She has a 30.00 pack-year smoking history. She has never used smokeless tobacco. She reports that she does not drink alcohol or use drugs.   Family History:  The patient's  family history includes Heart failure in her mother.   ROS:  Please see the history of present illness.   All other systems are personally reviewed and negative.    Exam:    Vital Signs:  There were no vitals taken for this visit.  Well sounding   Labs/Other Tests and Data Reviewed:    Recent Labs: No results found for requested labs within last 8760 hours.   Wt Readings from Last 3 Encounters:  07/20/18 168 lb (76.2 kg)  03/13/18 170 lb (77.1 kg)  11/29/17 167 lb (75.8 kg)     Last device remote is reviewed from [facility] which reveals normal device function    ASSESSMENT & PLAN:    1.  Symptomatic bradycardia Normal device function by recent remote See PaceArt report  2.  Permanent atrial fibrillation V rates controlled Continue Eliquis for CHADS2VASC of 7  3.  HTN Stable No change required today  4.  Chronic diastolic heart failure Still with LE edema but stable No changes today   Follow-up:  Remotes, me in 1 year    Patient Risk:  after full review of this patients clinical status, I feel that they are at moderate risk at this time.  Today, I have spent 15 minutes with the patient with telehealth technology discussing arrhythmia management .    Army Fossa, MD  12/08/2018 10:32 AM     [facility] [address] [place] [address] [phone] (office) [phone] (fax)

## 2018-12-18 ENCOUNTER — Encounter: Payer: Self-pay | Admitting: Cardiology

## 2018-12-18 NOTE — Progress Notes (Signed)
Remote pacemaker transmission.   

## 2018-12-28 DIAGNOSIS — D696 Thrombocytopenia, unspecified: Secondary | ICD-10-CM | POA: Diagnosis not present

## 2018-12-28 DIAGNOSIS — Z6826 Body mass index (BMI) 26.0-26.9, adult: Secondary | ICD-10-CM | POA: Diagnosis not present

## 2018-12-28 DIAGNOSIS — I5032 Chronic diastolic (congestive) heart failure: Secondary | ICD-10-CM | POA: Diagnosis not present

## 2018-12-28 DIAGNOSIS — Z299 Encounter for prophylactic measures, unspecified: Secondary | ICD-10-CM | POA: Diagnosis not present

## 2018-12-28 DIAGNOSIS — E1165 Type 2 diabetes mellitus with hyperglycemia: Secondary | ICD-10-CM | POA: Diagnosis not present

## 2018-12-28 DIAGNOSIS — I10 Essential (primary) hypertension: Secondary | ICD-10-CM | POA: Diagnosis not present

## 2018-12-28 DIAGNOSIS — M25569 Pain in unspecified knee: Secondary | ICD-10-CM | POA: Diagnosis not present

## 2019-01-10 DIAGNOSIS — Z23 Encounter for immunization: Secondary | ICD-10-CM | POA: Diagnosis not present

## 2019-01-25 ENCOUNTER — Encounter: Payer: Self-pay | Admitting: Cardiology

## 2019-01-25 ENCOUNTER — Telehealth (INDEPENDENT_AMBULATORY_CARE_PROVIDER_SITE_OTHER): Payer: Medicare Other | Admitting: Cardiology

## 2019-01-25 VITALS — BP 143/72 | Ht 65.0 in | Wt 161.0 lb

## 2019-01-25 DIAGNOSIS — Z950 Presence of cardiac pacemaker: Secondary | ICD-10-CM

## 2019-01-25 DIAGNOSIS — I4891 Unspecified atrial fibrillation: Secondary | ICD-10-CM | POA: Diagnosis not present

## 2019-01-25 DIAGNOSIS — I5032 Chronic diastolic (congestive) heart failure: Secondary | ICD-10-CM

## 2019-01-25 DIAGNOSIS — I10 Essential (primary) hypertension: Secondary | ICD-10-CM

## 2019-01-25 DIAGNOSIS — I890 Lymphedema, not elsewhere classified: Secondary | ICD-10-CM

## 2019-01-25 MED ORDER — LISINOPRIL 10 MG PO TABS: 10.0000 mg | ORAL_TABLET | Freq: Every day | ORAL | 1 refills | Status: DC

## 2019-01-25 NOTE — Patient Instructions (Signed)
Your physician wants you to follow-up in: [place] will receive a reminder letter in the mail two months in advance. If you don't receive a letter, please call our office to schedule the follow-up appointment.  Your physician has recommended you make the following change in your medication:   DECREASE LISINOPRIL 10 MG DAILY   Your physician has requested that you regularly monitor and record your blood pressure readings at home FOR 1 WEEK AND CALL us WITH READINGS. Please use the same machine at the same time of day to check your readings  Thank you for choosing [facility]!!

## 2019-01-25 NOTE — Progress Notes (Signed)
Virtual Visit via Telephone Note   This visit type was conducted due to national recommendations for restrictions regarding the COVID-19 Pandemic (e.g. social distancing) in an effort to limit this patient's exposure and mitigate transmission in our community.  Due to her co-morbid illnesses, this patient is at least at moderate risk for complications without adequate follow up.  This format is felt to be most appropriate for this patient at this time.  The patient did not have access to video technology/had technical difficulties with video requiring transitioning to audio format only (telephone).  All issues noted in this document were discussed and addressed.  No physical exam could be performed with this format.  Please refer to the patient's chart for her  consent to telehealth for [place].   Date:  01/25/2019   ID:  Megan Walsh, DOB 1933-02-17, MRN [id number]  Patient Location: Home Provider Location: Office  PCP:  Monico Blitz, MD  Cardiologist:  Megan Dolly, MD  Electrophysiologist:  Thompson Grayer, MD   Evaluation Performed:  Follow-Up Visit  Chief Complaint:  Follow up  History of Present Illness:    Megan Walsh is a 85 y.o. female seen today for follow up of the following medical problems.   1. Chronic diastolic HF/Lymphedema/Leg swelling - admit to [facility] 07/2017 with fluid overload -07/2017 echo showed LVEF 55-60%. Diuresed with improved symptoms  - completed lymphedema clinic in 01/2018. Has home compression stockings and pump.  - no recent SOB/DOE - chronic mildly improved. Continues to use compression treatments   2. Permanent pacemaker - normal device check 11/2018 with EP visit - no symptoms  3. Afib - no recent palpitations - compliant with meds  4. Hypertrophic CM - echo with severe asymmetric septal hypertrophy in 2016, no gradient. - denies any symptoms   5. HTN -bp meds previously lowered due to issues with low bp's  -still with some low bps at times, her home aids reprots SBP occasioanlly high 80s  6. Hyperlipidemia -labs followed by pcp - compliant with statin   The patient does not have symptoms concerning for COVID-19 infection (fever, chills, cough, or new shortness of breath).    Past Medical History:  Diagnosis Date  . Anxiety   . Arthritis   . CHF (congestive heart failure) ([place])   . Coronary artery disease   . Diabetes mellitus without complication ([place])   . Dysrhythmia    a-fib  . Fatty tumor   . History of gout   . Hypertension   . Myocardial infarction ([place])   . Permanent atrial fibrillation   . Presence of permanent cardiac pacemaker    MDT ADDR01/Implanted: 05/31/08  . Sleep apnea    cannot tolerate CPAP  . Symptomatic bradycardia    Past Surgical History:  Procedure Laterality Date  . ABDOMINAL HYSTERECTOMY    . BROW LIFT Bilateral 03/10/2018   Procedure: BLEPHAROPLASTY BILATERAL UPPER EYELIDS;  Surgeon: Baruch Goldmann, MD;  Location: [facility];  Service: Ophthalmology;  Laterality: Bilateral;  . CATARACT EXTRACTION W/PHACO Left 10/17/2017   Procedure: CATARACT EXTRACTION PHACO AND INTRAOCULAR LENS PLACEMENT LEFT EYE;  Surgeon: Tonny Branch, MD;  Location: [facility];  Service: Ophthalmology;  Laterality: Left;  CDE: 11.64  . CATARACT EXTRACTION W/PHACO Right 10/31/2017   Procedure: CATARACT EXTRACTION PHACO AND INTRAOCULAR LENS PLACEMENT RIGHT EYE;  Surgeon: Tonny Branch, MD;  Location: [facility];  Service: Ophthalmology;  Laterality: Right;  CDE: 11.19  . fatty tumor removal to left arm Right   .  PACEMAKER GENERATOR CHANGE  05/31/08   at [facility]: MDT ADDR01/Implanted: 05/31/08 as gen change by Dr Reinaldo Berber  . PACEMAKER INSERTION  1992     Current Meds  Medication Sig  . acetaminophen (TYLENOL) 650 MG CR tablet Take 650 mg by mouth 2 (two) times daily as needed for pain.   Marland Kitchen allopurinol (ZYLOPRIM) 300 MG tablet Take 300 mg by mouth daily.  Marland Kitchen amoxicillin (AMOXIL) 500 MG capsule Take  2,000 mg by mouth See admin instructions. Take 2000 mg by mouth 1 hour prior to dental appointment  . ascorbic acid (VITAMIN C) 500 MG tablet Take 500 mg by mouth daily.  Marland Kitchen docusate sodium (COLACE) 100 MG capsule Take 1 capsule (100 mg total) by mouth every 12 (twelve) hours. (Patient taking differently: Take 100 mg by mouth daily as needed for moderate constipation. )  . ELIQUIS 5 MG TABS tablet TAKE 1 TABLET BY MOUTH TWICE DAILY  . furosemide (LASIX) 20 MG tablet Take 40 mg by mouth 2 (two) times daily.   Marland Kitchen gabapentin (NEURONTIN) 100 MG capsule Take 100 mg by mouth 2 (two) times daily.  Marland Kitchen lisinopril (PRINIVIL,ZESTRIL) 20 MG tablet Take 1 tablet (20 mg total) by mouth daily.  . metoprolol succinate (TOPROL-XL) 25 MG 24 hr tablet Take 1 tablet (25 mg total) by mouth daily.  . potassium chloride SA (K-DUR,KLOR-CON) 20 MEQ tablet Take 20 mEq by mouth daily.  . simvastatin (ZOCOR) 40 MG tablet Take 40 mg by mouth daily.     Allergies:   Atorvastatin   Social History   Tobacco Use  . Smoking status: Former Smoker    Packs/day: 1.00    Years: 30.00    Pack years: 30.00    Types: Cigarettes    Start date: 08/22/1944    Quit date: 05/10/1974    Years since quitting: 44.7  . Smokeless tobacco: Never Used  Substance Use Topics  . Alcohol use: No    Alcohol/week: 0.0 standard drinks  . Drug use: No     Family Hx: The patient's family history includes Heart failure in her mother.  ROS:   Please see the history of present illness.     All other systems reviewed and are negative.   Prior CV studies:   The following studies were reviewed today:  09/2014 echo Study Conclusions  - Left ventricle: The cavity size was normal. There was severe asymmetric hypertrophy of the septum. Systolic function was normal. The estimated ejection fraction was in the range of 60% to 65%. Wall motion was normal; there were no regional wall motion abnormalities. - Aortic valve: There was mild  regurgitation. - Mitral valve: There was mild regurgitation directed centrally. - Left atrium: The atrium was moderately to severely dilated. - Tricuspid valve: There was moderate regurgitation. - Pulmonary arteries: Systolic pressure was mildly increased. PA peak pressure: 41 mm Hg (S).  Impressions:  - Appearance is consistent with hypertrophic cardiomyopathy with no outflow tract obstruction (at rest).  Labs/Other Tests and Data Reviewed:    EKG:  No ECG reviewed.  Recent Labs: No results found for requested labs within last 8760 hours.   Recent Lipid Panel Lab Results  Component Value Date/Time   CHOL 124 09/23/2014 02:23 AM   TRIG 69 09/23/2014 02:23 AM   HDL 62 09/23/2014 02:23 AM   CHOLHDL 2.0 09/23/2014 02:23 AM   LDLCALC 48 09/23/2014 02:23 AM    Wt Readings from Last 3 Encounters:  01/25/19 161 lb (73 kg)  07/20/18  168 lb (76.2 kg)  03/13/18 170 lb (77.1 kg)     Objective:    Vital Signs:  BP (!) 143/72   Ht 5\' 5"  (1.651 m)   Wt 161 lb (73 kg)   BMI 26.79 kg/m    Normal affect. Normal speech pattern and tone. Comfortable, no apparent distress. No audible signs of SOB or wheezing.   ASSESSMENT & PLAN:    1.Chronic diastolic HF/LE edema/Lymphedema -stable weights, ongoing edema that is stable. Previous uptitration of diuretics led to AKI, I think remaining edema is lymphedema and will continue to treat with compression therapy.    2. Afib - no recent symptoms, continue current meds  3. Permanent pacemaker -recent normal device check, continue to monitor.   4. HTN - low bp's at times, we will lower lisinopril to 10mg  daily - if ongoing would repeat labs to see if she may be dehydrated.  - she is to report home bp's in 1 weeks     COVID-19 Education: The signs and symptoms of COVID-19 were discussed with the patient and how to seek care for testing (follow up with PCP or arrange E-visit).  The importance of social distancing was  discussed today.  Time:   Today, I have spent 22 minutes with the patient with telehealth technology discussing the above problems.     Medication Adjustments/Labs and Tests Ordered: Current medicines are reviewed at length with the patient today.  Concerns regarding medicines are outlined above.   Tests Ordered: No orders of the defined types were placed in this encounter.   Medication Changes: No orders of the defined types were placed in this encounter.   Follow Up:  Virtual Visit in 6 month(s)  Signed, Megan Dolly, MD  01/25/2019 10:42 AM    [place]

## 2019-01-30 ENCOUNTER — Telehealth: Payer: Self-pay | Admitting: *Deleted

## 2019-01-30 DIAGNOSIS — I5032 Chronic diastolic (congestive) heart failure: Secondary | ICD-10-CM | POA: Diagnosis not present

## 2019-01-30 DIAGNOSIS — Z299 Encounter for prophylactic measures, unspecified: Secondary | ICD-10-CM | POA: Diagnosis not present

## 2019-01-30 DIAGNOSIS — M1711 Unilateral primary osteoarthritis, right knee: Secondary | ICD-10-CM | POA: Diagnosis not present

## 2019-01-30 DIAGNOSIS — I10 Essential (primary) hypertension: Secondary | ICD-10-CM | POA: Diagnosis not present

## 2019-01-30 DIAGNOSIS — Z6825 Body mass index (BMI) 25.0-25.9, adult: Secondary | ICD-10-CM | POA: Diagnosis not present

## 2019-01-30 DIAGNOSIS — I4891 Unspecified atrial fibrillation: Secondary | ICD-10-CM | POA: Diagnosis not present

## 2019-01-30 NOTE — Telephone Encounter (Signed)
Pt wanted to let Dr Harl Bowie that hgbA1c is 6

## 2019-02-06 DIAGNOSIS — I10 Essential (primary) hypertension: Secondary | ICD-10-CM | POA: Diagnosis not present

## 2019-02-15 ENCOUNTER — Other Ambulatory Visit: Payer: Self-pay | Admitting: Cardiology

## 2019-02-20 ENCOUNTER — Telehealth: Payer: Self-pay | Admitting: Cardiology

## 2019-02-20 DIAGNOSIS — E1165 Type 2 diabetes mellitus with hyperglycemia: Secondary | ICD-10-CM | POA: Diagnosis not present

## 2019-02-20 DIAGNOSIS — Z299 Encounter for prophylactic measures, unspecified: Secondary | ICD-10-CM | POA: Diagnosis not present

## 2019-02-20 DIAGNOSIS — I10 Essential (primary) hypertension: Secondary | ICD-10-CM | POA: Diagnosis not present

## 2019-02-20 DIAGNOSIS — Z713 Dietary counseling and surveillance: Secondary | ICD-10-CM | POA: Diagnosis not present

## 2019-02-20 DIAGNOSIS — I5032 Chronic diastolic (congestive) heart failure: Secondary | ICD-10-CM | POA: Diagnosis not present

## 2019-02-20 DIAGNOSIS — M25569 Pain in unspecified knee: Secondary | ICD-10-CM | POA: Diagnosis not present

## 2019-02-20 NOTE — Telephone Encounter (Signed)
Patient calling to give BP readings °

## 2019-02-21 DIAGNOSIS — R69 Illness, unspecified: Secondary | ICD-10-CM | POA: Diagnosis not present

## 2019-02-22 ENCOUNTER — Telehealth: Payer: Medicare Other | Admitting: Cardiology

## 2019-02-22 DIAGNOSIS — E109 Type 1 diabetes mellitus without complications: Secondary | ICD-10-CM | POA: Diagnosis not present

## 2019-02-22 NOTE — Telephone Encounter (Signed)
BP's look good, no changes   J Jolon Degante MD 

## 2019-02-22 NOTE — Telephone Encounter (Signed)
Calling back

## 2019-02-22 NOTE — Telephone Encounter (Signed)
Patient informed via vm. 

## 2019-02-22 NOTE — Telephone Encounter (Signed)
10/01 115/74 10/02 132/75 10/03 130/76 10/04 139/76 10/05 116/77 10/06 128/61 10/07 133/69 10/08 123/62 10/09 109/69 10/10 110/69 10/11 128/64 10/12 115/57 10/13 114/63 10/14 131/67 10/15 120/69 

## 2019-03-01 ENCOUNTER — Telehealth: Payer: Self-pay | Admitting: Internal Medicine

## 2019-03-01 NOTE — Telephone Encounter (Signed)
Patient called stating that she is feeling funny. States that she is dizzy. She is requesting to speak with someone about having her device checked today.

## 2019-03-01 NOTE — Telephone Encounter (Signed)
Routed to device clinic 

## 2019-03-01 NOTE — Telephone Encounter (Signed)
Attempted to call patient, no answer. Left message asking her to send a remote transmission for review.   Chanetta Marshall, NP 03/01/2019 8:52 AM

## 2019-03-01 NOTE — Telephone Encounter (Signed)
Transmission received. Normal device function. V rates controlled. VP on presenting.  Pt states that she is having dizziness. It feels like "she is going to fall". She fell in the kitchen Sunday night. She did have dizziness before her fall. She did not pass out. Before her fall, she had been standing for a while. She turned around and got dizzy. Her dizziness comes and goes. It is more likely to happen when she turns around.   Reassured her that pacemaker function was normal. I do not think her dizziness is cardiac. I have advised her to follow up with PCP. ER precautions given  Chanetta Marshall, NP 03/01/2019 12:30 PM

## 2019-03-01 NOTE — Telephone Encounter (Signed)
Pt sent a transmission this morning. Transmission received.The pt states she is not feeling well. Pt head doesn't feel right. Pt is asking for the nurse to give her a call back soon please.

## 2019-03-01 NOTE — Telephone Encounter (Signed)
New message   Patient wants to know if transmission went through. Please advise.

## 2019-03-02 DIAGNOSIS — Z6825 Body mass index (BMI) 25.0-25.9, adult: Secondary | ICD-10-CM | POA: Diagnosis not present

## 2019-03-02 DIAGNOSIS — I4891 Unspecified atrial fibrillation: Secondary | ICD-10-CM | POA: Diagnosis not present

## 2019-03-02 DIAGNOSIS — R42 Dizziness and giddiness: Secondary | ICD-10-CM | POA: Diagnosis not present

## 2019-03-02 DIAGNOSIS — I10 Essential (primary) hypertension: Secondary | ICD-10-CM | POA: Diagnosis not present

## 2019-03-02 DIAGNOSIS — Z299 Encounter for prophylactic measures, unspecified: Secondary | ICD-10-CM | POA: Diagnosis not present

## 2019-03-06 ENCOUNTER — Encounter: Payer: Medicare Other | Admitting: *Deleted

## 2019-03-07 DIAGNOSIS — I10 Essential (primary) hypertension: Secondary | ICD-10-CM | POA: Diagnosis not present

## 2019-03-09 DIAGNOSIS — Z6825 Body mass index (BMI) 25.0-25.9, adult: Secondary | ICD-10-CM | POA: Diagnosis not present

## 2019-03-09 DIAGNOSIS — I5032 Chronic diastolic (congestive) heart failure: Secondary | ICD-10-CM | POA: Diagnosis not present

## 2019-03-09 DIAGNOSIS — Z299 Encounter for prophylactic measures, unspecified: Secondary | ICD-10-CM | POA: Diagnosis not present

## 2019-03-09 DIAGNOSIS — I10 Essential (primary) hypertension: Secondary | ICD-10-CM | POA: Diagnosis not present

## 2019-03-09 DIAGNOSIS — I4891 Unspecified atrial fibrillation: Secondary | ICD-10-CM | POA: Diagnosis not present

## 2019-03-09 DIAGNOSIS — R42 Dizziness and giddiness: Secondary | ICD-10-CM | POA: Diagnosis not present

## 2019-03-22 DIAGNOSIS — R05 Cough: Secondary | ICD-10-CM | POA: Diagnosis not present

## 2019-03-22 DIAGNOSIS — Z87891 Personal history of nicotine dependence: Secondary | ICD-10-CM | POA: Diagnosis not present

## 2019-03-22 DIAGNOSIS — Z6825 Body mass index (BMI) 25.0-25.9, adult: Secondary | ICD-10-CM | POA: Diagnosis not present

## 2019-03-22 DIAGNOSIS — Z299 Encounter for prophylactic measures, unspecified: Secondary | ICD-10-CM | POA: Diagnosis not present

## 2019-03-22 DIAGNOSIS — E1165 Type 2 diabetes mellitus with hyperglycemia: Secondary | ICD-10-CM | POA: Diagnosis not present

## 2019-03-22 DIAGNOSIS — I10 Essential (primary) hypertension: Secondary | ICD-10-CM | POA: Diagnosis not present

## 2019-03-23 DIAGNOSIS — E109 Type 1 diabetes mellitus without complications: Secondary | ICD-10-CM | POA: Diagnosis not present

## 2019-03-30 DIAGNOSIS — I4891 Unspecified atrial fibrillation: Secondary | ICD-10-CM | POA: Diagnosis not present

## 2019-03-30 DIAGNOSIS — R252 Cramp and spasm: Secondary | ICD-10-CM | POA: Diagnosis not present

## 2019-03-30 DIAGNOSIS — I5032 Chronic diastolic (congestive) heart failure: Secondary | ICD-10-CM | POA: Diagnosis not present

## 2019-03-30 DIAGNOSIS — E1165 Type 2 diabetes mellitus with hyperglycemia: Secondary | ICD-10-CM | POA: Diagnosis not present

## 2019-03-30 DIAGNOSIS — Z6825 Body mass index (BMI) 25.0-25.9, adult: Secondary | ICD-10-CM | POA: Diagnosis not present

## 2019-04-02 DIAGNOSIS — E559 Vitamin D deficiency, unspecified: Secondary | ICD-10-CM | POA: Diagnosis not present

## 2019-04-02 DIAGNOSIS — E1165 Type 2 diabetes mellitus with hyperglycemia: Secondary | ICD-10-CM | POA: Diagnosis not present

## 2019-04-02 DIAGNOSIS — Z299 Encounter for prophylactic measures, unspecified: Secondary | ICD-10-CM | POA: Diagnosis not present

## 2019-04-02 DIAGNOSIS — Z6825 Body mass index (BMI) 25.0-25.9, adult: Secondary | ICD-10-CM | POA: Diagnosis not present

## 2019-04-02 DIAGNOSIS — M79643 Pain in unspecified hand: Secondary | ICD-10-CM | POA: Diagnosis not present

## 2019-04-02 DIAGNOSIS — I10 Essential (primary) hypertension: Secondary | ICD-10-CM | POA: Diagnosis not present

## 2019-04-02 DIAGNOSIS — M25569 Pain in unspecified knee: Secondary | ICD-10-CM | POA: Diagnosis not present

## 2019-04-09 DIAGNOSIS — I10 Essential (primary) hypertension: Secondary | ICD-10-CM | POA: Diagnosis not present

## 2019-04-16 DIAGNOSIS — M549 Dorsalgia, unspecified: Secondary | ICD-10-CM | POA: Diagnosis not present

## 2019-04-16 DIAGNOSIS — M79641 Pain in right hand: Secondary | ICD-10-CM | POA: Diagnosis not present

## 2019-04-16 DIAGNOSIS — E1165 Type 2 diabetes mellitus with hyperglycemia: Secondary | ICD-10-CM | POA: Diagnosis not present

## 2019-04-16 DIAGNOSIS — Z6825 Body mass index (BMI) 25.0-25.9, adult: Secondary | ICD-10-CM | POA: Diagnosis not present

## 2019-04-16 DIAGNOSIS — M06049 Rheumatoid arthritis without rheumatoid factor, unspecified hand: Secondary | ICD-10-CM | POA: Diagnosis not present

## 2019-04-16 DIAGNOSIS — Z299 Encounter for prophylactic measures, unspecified: Secondary | ICD-10-CM | POA: Diagnosis not present

## 2019-04-20 DIAGNOSIS — E109 Type 1 diabetes mellitus without complications: Secondary | ICD-10-CM | POA: Diagnosis not present

## 2019-05-02 DIAGNOSIS — I10 Essential (primary) hypertension: Secondary | ICD-10-CM | POA: Diagnosis not present

## 2019-05-09 DIAGNOSIS — M06049 Rheumatoid arthritis without rheumatoid factor, unspecified hand: Secondary | ICD-10-CM | POA: Diagnosis not present

## 2019-05-18 DIAGNOSIS — E109 Type 1 diabetes mellitus without complications: Secondary | ICD-10-CM | POA: Diagnosis not present

## 2019-05-21 DIAGNOSIS — H26491 Other secondary cataract, right eye: Secondary | ICD-10-CM | POA: Diagnosis not present

## 2019-05-21 DIAGNOSIS — Z961 Presence of intraocular lens: Secondary | ICD-10-CM | POA: Diagnosis not present

## 2019-05-21 DIAGNOSIS — H40011 Open angle with borderline findings, low risk, right eye: Secondary | ICD-10-CM | POA: Diagnosis not present

## 2019-05-21 DIAGNOSIS — H26492 Other secondary cataract, left eye: Secondary | ICD-10-CM | POA: Diagnosis not present

## 2019-05-25 DIAGNOSIS — R609 Edema, unspecified: Secondary | ICD-10-CM | POA: Diagnosis not present

## 2019-05-25 DIAGNOSIS — E1165 Type 2 diabetes mellitus with hyperglycemia: Secondary | ICD-10-CM | POA: Diagnosis not present

## 2019-05-25 DIAGNOSIS — Z6826 Body mass index (BMI) 26.0-26.9, adult: Secondary | ICD-10-CM | POA: Diagnosis not present

## 2019-05-25 DIAGNOSIS — M25561 Pain in right knee: Secondary | ICD-10-CM | POA: Diagnosis not present

## 2019-05-25 DIAGNOSIS — M0600 Rheumatoid arthritis without rheumatoid factor, unspecified site: Secondary | ICD-10-CM | POA: Diagnosis not present

## 2019-05-25 DIAGNOSIS — Z299 Encounter for prophylactic measures, unspecified: Secondary | ICD-10-CM | POA: Diagnosis not present

## 2019-05-25 DIAGNOSIS — Z87891 Personal history of nicotine dependence: Secondary | ICD-10-CM | POA: Diagnosis not present

## 2019-05-25 DIAGNOSIS — I10 Essential (primary) hypertension: Secondary | ICD-10-CM | POA: Diagnosis not present

## 2019-05-25 DIAGNOSIS — Z79899 Other long term (current) drug therapy: Secondary | ICD-10-CM | POA: Diagnosis not present

## 2019-05-29 ENCOUNTER — Ambulatory Visit (INDEPENDENT_AMBULATORY_CARE_PROVIDER_SITE_OTHER): Payer: Medicare HMO | Admitting: *Deleted

## 2019-05-29 DIAGNOSIS — I5032 Chronic diastolic (congestive) heart failure: Secondary | ICD-10-CM | POA: Diagnosis not present

## 2019-05-29 DIAGNOSIS — D696 Thrombocytopenia, unspecified: Secondary | ICD-10-CM | POA: Diagnosis not present

## 2019-05-29 DIAGNOSIS — I10 Essential (primary) hypertension: Secondary | ICD-10-CM | POA: Diagnosis not present

## 2019-05-29 DIAGNOSIS — Z87891 Personal history of nicotine dependence: Secondary | ICD-10-CM | POA: Diagnosis not present

## 2019-05-29 DIAGNOSIS — I495 Sick sinus syndrome: Secondary | ICD-10-CM | POA: Diagnosis not present

## 2019-05-29 DIAGNOSIS — E1165 Type 2 diabetes mellitus with hyperglycemia: Secondary | ICD-10-CM | POA: Diagnosis not present

## 2019-05-29 DIAGNOSIS — Z6825 Body mass index (BMI) 25.0-25.9, adult: Secondary | ICD-10-CM | POA: Diagnosis not present

## 2019-05-29 DIAGNOSIS — Z299 Encounter for prophylactic measures, unspecified: Secondary | ICD-10-CM | POA: Diagnosis not present

## 2019-05-31 DIAGNOSIS — E1165 Type 2 diabetes mellitus with hyperglycemia: Secondary | ICD-10-CM | POA: Diagnosis not present

## 2019-05-31 DIAGNOSIS — R413 Other amnesia: Secondary | ICD-10-CM | POA: Diagnosis not present

## 2019-05-31 LAB — CUP PACEART REMOTE DEVICE CHECK
Battery Impedance: 3168 Ohm
Battery Remaining Longevity: 23 mo
Battery Voltage: 2.74 V
Brady Statistic RV Percent Paced: 84 %
Date Time Interrogation Session: 20210119025809
Implantable Lead Implant Date: 19980116
Implantable Lead Implant Date: 19980116
Implantable Lead Location: 753859
Implantable Lead Location: 753860
Implantable Lead Model: 5034
Implantable Lead Model: 5534
Implantable Pulse Generator Implant Date: 20100122
Lead Channel Impedance Value: 1415 Ohm
Lead Channel Impedance Value: 67 Ohm
Lead Channel Pacing Threshold Amplitude: 0.375 V
Lead Channel Pacing Threshold Pulse Width: 0.4 ms
Lead Channel Setting Pacing Amplitude: 2.5 V
Lead Channel Setting Pacing Pulse Width: 0.4 ms
Lead Channel Setting Sensing Sensitivity: 4 mV

## 2019-06-05 DIAGNOSIS — I10 Essential (primary) hypertension: Secondary | ICD-10-CM | POA: Diagnosis not present

## 2019-06-12 DIAGNOSIS — I5032 Chronic diastolic (congestive) heart failure: Secondary | ICD-10-CM | POA: Diagnosis not present

## 2019-06-12 DIAGNOSIS — G309 Alzheimer's disease, unspecified: Secondary | ICD-10-CM | POA: Diagnosis not present

## 2019-06-12 DIAGNOSIS — R413 Other amnesia: Secondary | ICD-10-CM | POA: Diagnosis not present

## 2019-06-12 DIAGNOSIS — Z6825 Body mass index (BMI) 25.0-25.9, adult: Secondary | ICD-10-CM | POA: Diagnosis not present

## 2019-06-12 DIAGNOSIS — F0280 Dementia in other diseases classified elsewhere without behavioral disturbance: Secondary | ICD-10-CM | POA: Diagnosis not present

## 2019-06-12 DIAGNOSIS — Z299 Encounter for prophylactic measures, unspecified: Secondary | ICD-10-CM | POA: Diagnosis not present

## 2019-06-18 DIAGNOSIS — E109 Type 1 diabetes mellitus without complications: Secondary | ICD-10-CM | POA: Diagnosis not present

## 2019-07-06 DIAGNOSIS — M06049 Rheumatoid arthritis without rheumatoid factor, unspecified hand: Secondary | ICD-10-CM | POA: Diagnosis not present

## 2019-07-06 DIAGNOSIS — Z6825 Body mass index (BMI) 25.0-25.9, adult: Secondary | ICD-10-CM | POA: Diagnosis not present

## 2019-07-06 DIAGNOSIS — I10 Essential (primary) hypertension: Secondary | ICD-10-CM | POA: Diagnosis not present

## 2019-07-06 DIAGNOSIS — Z87891 Personal history of nicotine dependence: Secondary | ICD-10-CM | POA: Diagnosis not present

## 2019-07-06 DIAGNOSIS — M25561 Pain in right knee: Secondary | ICD-10-CM | POA: Diagnosis not present

## 2019-07-06 DIAGNOSIS — M25562 Pain in left knee: Secondary | ICD-10-CM | POA: Diagnosis not present

## 2019-07-06 DIAGNOSIS — Z299 Encounter for prophylactic measures, unspecified: Secondary | ICD-10-CM | POA: Diagnosis not present

## 2019-07-06 DIAGNOSIS — G8929 Other chronic pain: Secondary | ICD-10-CM | POA: Diagnosis not present

## 2019-07-08 DIAGNOSIS — I10 Essential (primary) hypertension: Secondary | ICD-10-CM | POA: Diagnosis not present

## 2019-07-11 DIAGNOSIS — I10 Essential (primary) hypertension: Secondary | ICD-10-CM | POA: Diagnosis not present

## 2019-07-11 DIAGNOSIS — R413 Other amnesia: Secondary | ICD-10-CM | POA: Diagnosis not present

## 2019-07-11 DIAGNOSIS — Z299 Encounter for prophylactic measures, unspecified: Secondary | ICD-10-CM | POA: Diagnosis not present

## 2019-07-11 DIAGNOSIS — Z87891 Personal history of nicotine dependence: Secondary | ICD-10-CM | POA: Diagnosis not present

## 2019-07-11 DIAGNOSIS — F0280 Dementia in other diseases classified elsewhere without behavioral disturbance: Secondary | ICD-10-CM | POA: Diagnosis not present

## 2019-07-11 DIAGNOSIS — Z6825 Body mass index (BMI) 25.0-25.9, adult: Secondary | ICD-10-CM | POA: Diagnosis not present

## 2019-07-11 DIAGNOSIS — G309 Alzheimer's disease, unspecified: Secondary | ICD-10-CM | POA: Diagnosis not present

## 2019-07-11 DIAGNOSIS — E1165 Type 2 diabetes mellitus with hyperglycemia: Secondary | ICD-10-CM | POA: Diagnosis not present

## 2019-07-17 ENCOUNTER — Telehealth (INDEPENDENT_AMBULATORY_CARE_PROVIDER_SITE_OTHER): Payer: Medicare HMO | Admitting: Cardiology

## 2019-07-17 ENCOUNTER — Encounter: Payer: Self-pay | Admitting: *Deleted

## 2019-07-17 ENCOUNTER — Encounter: Payer: Self-pay | Admitting: Cardiology

## 2019-07-17 VITALS — BP 117/60 | HR 65 | Ht 65.0 in | Wt 146.0 lb

## 2019-07-17 DIAGNOSIS — Z950 Presence of cardiac pacemaker: Secondary | ICD-10-CM

## 2019-07-17 DIAGNOSIS — E109 Type 1 diabetes mellitus without complications: Secondary | ICD-10-CM | POA: Diagnosis not present

## 2019-07-17 DIAGNOSIS — I5032 Chronic diastolic (congestive) heart failure: Secondary | ICD-10-CM

## 2019-07-17 DIAGNOSIS — I10 Essential (primary) hypertension: Secondary | ICD-10-CM

## 2019-07-17 DIAGNOSIS — I4891 Unspecified atrial fibrillation: Secondary | ICD-10-CM

## 2019-07-17 NOTE — Progress Notes (Signed)
Virtual Visit via Telephone Note   This visit type was conducted due to national recommendations for restrictions regarding the COVID-19 Pandemic (e.g. social distancing) in an effort to limit this patient's exposure and mitigate transmission in our community.  Due to her co-morbid illnesses, this patient is at least at moderate risk for complications without adequate follow up.  This format is felt to be most appropriate for this patient at this time.  The patient did not have access to video technology/had technical difficulties with video requiring transitioning to audio format only (telephone).  All issues noted in this document were discussed and addressed.  No physical exam could be performed with this format.  Please refer to the patient's chart for her  consent to telehealth for [facility].   The patient was identified using 2 identifiers.  Date:  07/17/2019   ID:  Megan Walsh, DOB 04/16/1933, MRN [id number]  Patient Location: Home Provider Location: Office  PCP:  Monico Blitz, MD  Cardiologist:  Carlyle Dolly, MD  Electrophysiologist:  Thompson Grayer, MD   Evaluation Performed:  Follow-Up Visit  Chief Complaint:  Follow up visit  History of Present Illness:    Walsh Megan is a 86 y.o. female seen today for follow up of the following medical problems.   1. Chronic diastolic HF/Lymphedema/Leg swelling - admit to [facility] 07/2017 with fluid overload -07/2017 echo showed LVEF 55-60%. Diuresed with improved symptoms  - completed lymphedema clinic in 01/2018. Has home compression stockings and pump.    - swelling much improved. - no recent SOB or doe - compliant with meds.    2. Permanent pacemaker -no recent symptoms. Normal device check Jan 2021  3. Afib - no recent palpitations - compliant with meds  - no recent palpitations . No bleeding on eliquis  4. Hypertrophic CM - echo with severe asymmetric septal hypertrophy in 2016, no gradient. -  denies any symptoms   5. HTN -bp meds previously lowered due to issues with low bp's -still with some low bps at times, her home aids reprots SBP occasioanlly high 80s  6. Hyperlipidemia -labs followed by pcp - compliant with statin  The patient does not have symptoms concerning for COVID-19 infection (fever, chills, cough, or new shortness of breath).    Past Medical History:  Diagnosis Date  . Anxiety   . Arthritis   . CHF (congestive heart failure) ([place])   . Coronary artery disease   . Diabetes mellitus without complication ([place])   . Dysrhythmia    a-fib  . Fatty tumor   . History of gout   . Hypertension   . Myocardial infarction ([place])   . Permanent atrial fibrillation   . Presence of permanent cardiac pacemaker    MDT ADDR01/Implanted: 05/31/08  . Sleep apnea    cannot tolerate CPAP  . Symptomatic bradycardia    Past Surgical History:  Procedure Laterality Date  . ABDOMINAL HYSTERECTOMY    . BROW LIFT Bilateral 03/10/2018   Procedure: BLEPHAROPLASTY BILATERAL UPPER EYELIDS;  Surgeon: Baruch Goldmann, MD;  Location: [facility];  Service: Ophthalmology;  Laterality: Bilateral;  . CATARACT EXTRACTION W/PHACO Left 10/17/2017   Procedure: CATARACT EXTRACTION PHACO AND INTRAOCULAR LENS PLACEMENT LEFT EYE;  Surgeon: Tonny Lliam Hoh, MD;  Location: [facility];  Service: Ophthalmology;  Laterality: Left;  CDE: 11.64  . CATARACT EXTRACTION W/PHACO Right 10/31/2017   Procedure: CATARACT EXTRACTION PHACO AND INTRAOCULAR LENS PLACEMENT RIGHT EYE;  Surgeon: Tonny Telly Broberg, MD;  Location: [facility];  Service: Ophthalmology;  Laterality: Right;  CDE: 11.19  . fatty tumor removal to left arm Right   . PACEMAKER GENERATOR CHANGE  05/31/08   at [facility]: MDT ADDR01/Implanted: 05/31/08 as gen change by Dr Reinaldo Berber  . PACEMAKER INSERTION  1992     No outpatient medications have been marked as taking for the 07/17/19 encounter (Appointment) with [name], MD.     Allergies:   Atorvastatin    Social History   Tobacco Use  . Smoking status: Former Smoker    Packs/day: 1.00    Years: 30.00    Pack years: 30.00    Types: Cigarettes    Start date: 08/22/1944    Quit date: 05/10/1974    Years since quitting: 45.2  . Smokeless tobacco: Never Used  Substance Use Topics  . Alcohol use: No    Alcohol/week: 0.0 standard drinks  . Drug use: No     Family Hx: The patient's family history includes Heart failure in her mother.  ROS:   Please see the history of present illness.     All other systems reviewed and are negative.   Prior CV studies:   The following studies were reviewed today:  09/2014 echo Study Conclusions  - Left ventricle: The cavity size was normal. There was severe asymmetric hypertrophy of the septum. Systolic function was normal. The estimated ejection fraction was in the range of 60% to 65%. Wall motion was normal; there were no regional wall motion abnormalities. - Aortic valve: There was mild regurgitation. - Mitral valve: There was mild regurgitation directed centrally. - Left atrium: The atrium was moderately to severely dilated. - Tricuspid valve: There was moderate regurgitation. - Pulmonary arteries: Systolic pressure was mildly increased. PA peak pressure: 41 mm Hg (S).  Impressions:  - Appearance is consistent with hypertrophic cardiomyopathy with no outflow tract obstruction (at rest).   Labs/Other Tests and Data Reviewed:    EKG:  No ECG reviewed.  Recent Labs: No results found for requested labs within last 8760 hours.   Recent Lipid Panel Lab Results  Component Value Date/Time   CHOL 124 09/23/2014 02:23 AM   TRIG 69 09/23/2014 02:23 AM   HDL 62 09/23/2014 02:23 AM   CHOLHDL 2.0 09/23/2014 02:23 AM   LDLCALC 48 09/23/2014 02:23 AM    Wt Readings from Last 3 Encounters:  01/25/19 161 lb (73 kg)  07/20/18 168 lb (76.2 kg)  03/13/18 170 lb (77.1 kg)     Objective:    Vital Signs:   Today's Vitals    07/17/19 1017  BP: 117/60  Pulse: 65  Weight: 146 lb (66.2 kg)  Height: 5\' 5"  (1.651 m)   Body mass index is 24.3 kg/m. Normal affect. Normal speech pattern and tone. Comfortable, no apparent distress, No audible signs of SOB or wheezing.   ASSESSMENT & PLAN:    1.Chronic diastolic HF/LE edema/Lymphedema -doing well, continue diuretic and home compression treatments.    2. Afib - doing well, continue current meds  3. Permanent pacemaker -continue to follow in device clinic, no normal function by last check.   4. HTN -at goal, continue current meds  COVID-19 Education: The signs and symptoms of COVID-19 were discussed with the patient and how to seek care for testing (follow up with PCP or arrange E-visit).  The importance of social distancing was discussed today.  Time:   Today, I have spent 21 minutes with the patient with telehealth technology discussing the above problems.  Medication Adjustments/Labs and Tests Ordered: Current medicines are reviewed at length with the patient today.  Concerns regarding medicines are outlined above.   Tests Ordered: No orders of the defined types were placed in this encounter.   Medication Changes: No orders of the defined types were placed in this encounter.   Follow Up:  Either In Person or Virtual in 6 month(s)  Signed, Carlyle Dolly, MD  07/17/2019 8:56 AM    [place]

## 2019-07-17 NOTE — Patient Instructions (Signed)

## 2019-07-21 ENCOUNTER — Ambulatory Visit: Payer: Medicare HMO | Attending: Internal Medicine

## 2019-07-21 DIAGNOSIS — Z23 Encounter for immunization: Secondary | ICD-10-CM

## 2019-07-21 NOTE — Progress Notes (Signed)
   Covid-19 Vaccination Clinic  Name:  Megan Walsh    MRN: [id number] DOB: [birth year]  07/21/2019  [name] was observed post Covid-19 immunization for 15 minutes without incident. She was provided with Vaccine Information Sheet and instruction to access the V-Safe system.   [name] was instructed to call 911 with any severe reactions post vaccine: Marland Kitchen Difficulty breathing  . Swelling of face and throat  . A fast heartbeat  . A bad rash all over body  . Dizziness and weakness   Immunizations Administered    Name Date Dose VIS Date Route   Moderna COVID-19 Vaccine 07/21/2019 12:08 PM 0.5 mL 04/10/2019 Intramuscular   Manufacturer: Moderna   Lot: [id number]   [place]VO:[id number]

## 2019-07-27 ENCOUNTER — Other Ambulatory Visit: Payer: Self-pay | Admitting: Cardiology

## 2019-07-30 ENCOUNTER — Telehealth: Payer: Self-pay | Admitting: Licensed Clinical Social Worker

## 2019-07-30 NOTE — Telephone Encounter (Signed)
CSW referred to assist patient with obtaining a BP cuff. CSW contacted patient to inform cuff will be delivered to home. Patient grateful for support and assistance. CSW available as needed. Jackie Tyreak Reagle, LCSW, CCSW-MCS [phone]  

## 2019-08-06 DIAGNOSIS — M1711 Unilateral primary osteoarthritis, right knee: Secondary | ICD-10-CM | POA: Diagnosis not present

## 2019-08-09 DIAGNOSIS — M1712 Unilateral primary osteoarthritis, left knee: Secondary | ICD-10-CM | POA: Diagnosis not present

## 2019-08-13 DIAGNOSIS — M1711 Unilateral primary osteoarthritis, right knee: Secondary | ICD-10-CM | POA: Diagnosis not present

## 2019-08-14 DIAGNOSIS — I4891 Unspecified atrial fibrillation: Secondary | ICD-10-CM | POA: Diagnosis not present

## 2019-08-14 DIAGNOSIS — Z6824 Body mass index (BMI) 24.0-24.9, adult: Secondary | ICD-10-CM | POA: Diagnosis not present

## 2019-08-14 DIAGNOSIS — E1165 Type 2 diabetes mellitus with hyperglycemia: Secondary | ICD-10-CM | POA: Diagnosis not present

## 2019-08-14 DIAGNOSIS — D696 Thrombocytopenia, unspecified: Secondary | ICD-10-CM | POA: Diagnosis not present

## 2019-08-14 DIAGNOSIS — Z299 Encounter for prophylactic measures, unspecified: Secondary | ICD-10-CM | POA: Diagnosis not present

## 2019-08-14 DIAGNOSIS — I10 Essential (primary) hypertension: Secondary | ICD-10-CM | POA: Diagnosis not present

## 2019-08-16 ENCOUNTER — Other Ambulatory Visit: Payer: Self-pay | Admitting: Cardiology

## 2019-08-16 DIAGNOSIS — E109 Type 1 diabetes mellitus without complications: Secondary | ICD-10-CM | POA: Diagnosis not present

## 2019-08-17 DIAGNOSIS — M1712 Unilateral primary osteoarthritis, left knee: Secondary | ICD-10-CM | POA: Diagnosis not present

## 2019-08-20 DIAGNOSIS — M1711 Unilateral primary osteoarthritis, right knee: Secondary | ICD-10-CM | POA: Diagnosis not present

## 2019-08-22 ENCOUNTER — Ambulatory Visit: Payer: Medicare HMO | Attending: Internal Medicine

## 2019-08-22 DIAGNOSIS — Z23 Encounter for immunization: Secondary | ICD-10-CM

## 2019-08-22 NOTE — Progress Notes (Signed)
   Covid-19 Vaccination Clinic  Name:  Megan Walsh    MRN: [id number] DOB: 12-05-1932  08/22/2019  Megan Walsh was observed post Covid-19 immunization for 15 minutes without incident. She was provided with Vaccine Information Sheet and instruction to access the V-Safe system.   Megan Walsh was instructed to call 911 with any severe reactions post vaccine: Marland Kitchen Difficulty breathing  . Swelling of face and throat  . A fast heartbeat  . A bad rash all over body  . Dizziness and weakness   Immunizations Administered    Name Date Dose VIS Date Route   Moderna COVID-19 Vaccine 08/22/2019 10:23 AM 0.5 mL 04/10/2019 Intramuscular   Manufacturer: Moderna   Lot: [id number]   [id number]

## 2019-08-23 DIAGNOSIS — M1712 Unilateral primary osteoarthritis, left knee: Secondary | ICD-10-CM | POA: Diagnosis not present

## 2019-08-27 DIAGNOSIS — M1711 Unilateral primary osteoarthritis, right knee: Secondary | ICD-10-CM | POA: Diagnosis not present

## 2019-08-28 ENCOUNTER — Ambulatory Visit (INDEPENDENT_AMBULATORY_CARE_PROVIDER_SITE_OTHER): Payer: Medicare HMO | Admitting: *Deleted

## 2019-08-28 DIAGNOSIS — I495 Sick sinus syndrome: Secondary | ICD-10-CM

## 2019-08-29 LAB — CUP PACEART REMOTE DEVICE CHECK
Battery Impedance: 3648 Ohm
Battery Remaining Longevity: 20 mo
Battery Voltage: 2.73 V
Brady Statistic RV Percent Paced: 83 %
Date Time Interrogation Session: 20210420124557
Implantable Lead Implant Date: 19980116
Implantable Lead Implant Date: 19980116
Implantable Lead Location: 753859
Implantable Lead Location: 753860
Implantable Lead Model: 5034
Implantable Lead Model: 5534
Implantable Pulse Generator Implant Date: 20100122
Lead Channel Impedance Value: 1395 Ohm
Lead Channel Impedance Value: 67 Ohm
Lead Channel Pacing Threshold Amplitude: 0.375 V
Lead Channel Pacing Threshold Pulse Width: 0.4 ms
Lead Channel Setting Pacing Amplitude: 2.5 V
Lead Channel Setting Pacing Pulse Width: 0.4 ms
Lead Channel Setting Sensing Sensitivity: 5.6 mV

## 2019-08-29 NOTE — Progress Notes (Signed)
PPM Remote  

## 2019-08-31 DIAGNOSIS — M1712 Unilateral primary osteoarthritis, left knee: Secondary | ICD-10-CM | POA: Diagnosis not present

## 2019-09-03 DIAGNOSIS — M1711 Unilateral primary osteoarthritis, right knee: Secondary | ICD-10-CM | POA: Diagnosis not present

## 2019-09-04 DIAGNOSIS — E1142 Type 2 diabetes mellitus with diabetic polyneuropathy: Secondary | ICD-10-CM | POA: Diagnosis not present

## 2019-09-04 DIAGNOSIS — B351 Tinea unguium: Secondary | ICD-10-CM | POA: Diagnosis not present

## 2019-09-04 DIAGNOSIS — L84 Corns and callosities: Secondary | ICD-10-CM | POA: Diagnosis not present

## 2019-09-04 DIAGNOSIS — M79676 Pain in unspecified toe(s): Secondary | ICD-10-CM | POA: Diagnosis not present

## 2019-09-05 DIAGNOSIS — Z0000 Encounter for general adult medical examination without abnormal findings: Secondary | ICD-10-CM | POA: Diagnosis not present

## 2019-09-05 DIAGNOSIS — E7800 Pure hypercholesterolemia, unspecified: Secondary | ICD-10-CM | POA: Diagnosis not present

## 2019-09-05 DIAGNOSIS — Z7189 Other specified counseling: Secondary | ICD-10-CM | POA: Diagnosis not present

## 2019-09-05 DIAGNOSIS — Z1331 Encounter for screening for depression: Secondary | ICD-10-CM | POA: Diagnosis not present

## 2019-09-05 DIAGNOSIS — Z1339 Encounter for screening examination for other mental health and behavioral disorders: Secondary | ICD-10-CM | POA: Diagnosis not present

## 2019-09-05 DIAGNOSIS — Z1211 Encounter for screening for malignant neoplasm of colon: Secondary | ICD-10-CM | POA: Diagnosis not present

## 2019-09-05 DIAGNOSIS — Z6824 Body mass index (BMI) 24.0-24.9, adult: Secondary | ICD-10-CM | POA: Diagnosis not present

## 2019-09-05 DIAGNOSIS — E1165 Type 2 diabetes mellitus with hyperglycemia: Secondary | ICD-10-CM | POA: Diagnosis not present

## 2019-09-05 DIAGNOSIS — Z299 Encounter for prophylactic measures, unspecified: Secondary | ICD-10-CM | POA: Diagnosis not present

## 2019-09-06 DIAGNOSIS — M1712 Unilateral primary osteoarthritis, left knee: Secondary | ICD-10-CM | POA: Diagnosis not present

## 2019-09-19 DIAGNOSIS — E109 Type 1 diabetes mellitus without complications: Secondary | ICD-10-CM | POA: Diagnosis not present

## 2019-09-24 DIAGNOSIS — I10 Essential (primary) hypertension: Secondary | ICD-10-CM | POA: Diagnosis not present

## 2019-09-24 DIAGNOSIS — F0280 Dementia in other diseases classified elsewhere without behavioral disturbance: Secondary | ICD-10-CM | POA: Diagnosis not present

## 2019-09-24 DIAGNOSIS — Z299 Encounter for prophylactic measures, unspecified: Secondary | ICD-10-CM | POA: Diagnosis not present

## 2019-09-24 DIAGNOSIS — F322 Major depressive disorder, single episode, severe without psychotic features: Secondary | ICD-10-CM | POA: Diagnosis not present

## 2019-09-24 DIAGNOSIS — R6883 Chills (without fever): Secondary | ICD-10-CM | POA: Diagnosis not present

## 2019-09-24 DIAGNOSIS — G309 Alzheimer's disease, unspecified: Secondary | ICD-10-CM | POA: Diagnosis not present

## 2019-09-28 DIAGNOSIS — E2839 Other primary ovarian failure: Secondary | ICD-10-CM | POA: Diagnosis not present

## 2019-10-17 DIAGNOSIS — E109 Type 1 diabetes mellitus without complications: Secondary | ICD-10-CM | POA: Diagnosis not present

## 2019-10-23 DIAGNOSIS — Z1231 Encounter for screening mammogram for malignant neoplasm of breast: Secondary | ICD-10-CM | POA: Diagnosis not present

## 2019-11-05 DIAGNOSIS — M542 Cervicalgia: Secondary | ICD-10-CM | POA: Diagnosis not present

## 2019-11-05 DIAGNOSIS — I5032 Chronic diastolic (congestive) heart failure: Secondary | ICD-10-CM | POA: Diagnosis not present

## 2019-11-05 DIAGNOSIS — Z299 Encounter for prophylactic measures, unspecified: Secondary | ICD-10-CM | POA: Diagnosis not present

## 2019-11-05 DIAGNOSIS — G250 Essential tremor: Secondary | ICD-10-CM | POA: Diagnosis not present

## 2019-11-05 DIAGNOSIS — I10 Essential (primary) hypertension: Secondary | ICD-10-CM | POA: Diagnosis not present

## 2019-11-05 DIAGNOSIS — G309 Alzheimer's disease, unspecified: Secondary | ICD-10-CM | POA: Diagnosis not present

## 2019-11-16 DIAGNOSIS — E109 Type 1 diabetes mellitus without complications: Secondary | ICD-10-CM | POA: Diagnosis not present

## 2019-11-20 DIAGNOSIS — H524 Presbyopia: Secondary | ICD-10-CM | POA: Diagnosis not present

## 2019-11-26 ENCOUNTER — Telehealth: Payer: Self-pay | Admitting: Cardiology

## 2019-11-26 DIAGNOSIS — F0280 Dementia in other diseases classified elsewhere without behavioral disturbance: Secondary | ICD-10-CM | POA: Diagnosis not present

## 2019-11-26 DIAGNOSIS — I10 Essential (primary) hypertension: Secondary | ICD-10-CM | POA: Diagnosis not present

## 2019-11-26 DIAGNOSIS — Z299 Encounter for prophylactic measures, unspecified: Secondary | ICD-10-CM | POA: Diagnosis not present

## 2019-11-26 DIAGNOSIS — G309 Alzheimer's disease, unspecified: Secondary | ICD-10-CM | POA: Diagnosis not present

## 2019-11-26 DIAGNOSIS — N183 Chronic kidney disease, stage 3 unspecified: Secondary | ICD-10-CM | POA: Diagnosis not present

## 2019-11-26 DIAGNOSIS — D696 Thrombocytopenia, unspecified: Secondary | ICD-10-CM | POA: Diagnosis not present

## 2019-11-26 NOTE — Telephone Encounter (Signed)
Per phone call from [place]- pt's aid- called stating the pt had a low BP reading of 85/49 on Saturday with the home health nurse and they contacted the pharmacy -- they want to take her off of her  metoprolol succinate (TOPROL-XL) 25 MG 24 hr tablet [[id number]]  lisinopril (ZESTRIL) 10 MG tablet [[id number]]   for 2 days-- the aid wants to speak w/ Dr. Harl Bowie before taking the pt off any medication  Please call [phone] or home number [phone]

## 2019-11-26 NOTE — Telephone Encounter (Signed)
I would stop her lisinopril   Zandra Abts MD

## 2019-11-26 NOTE — Telephone Encounter (Signed)
Left message to return call 

## 2019-11-26 NOTE — Telephone Encounter (Signed)
Angie, aid returning call   7/12 - 100/54  64 7/13 - 88/45  63 7/14 - 105/48  69 7/15 - 89/46  88 7/16 - 98/56  73 7/17 - 85/49   7/19 - 112/58  49   Stated her BP readings has been fluctuating like this for the last month.  Did have OV with pcp (saw PA) 11/05/2019 - labs were done at that visit - was told she was dehydrated.  Does not have much of an appetite and not drinking very much.  Also, has had 2 falls this past month.  No c/o chest pain or SOB - does have the dizziness occasionally.    Supposed to be seeing pcp again today as well.

## 2019-11-26 NOTE — Telephone Encounter (Signed)
Pt voiced understanding - updated medication list 

## 2019-11-27 ENCOUNTER — Telehealth: Payer: Self-pay | Admitting: Internal Medicine

## 2019-11-27 ENCOUNTER — Ambulatory Visit (INDEPENDENT_AMBULATORY_CARE_PROVIDER_SITE_OTHER): Payer: Medicare HMO | Admitting: *Deleted

## 2019-11-27 DIAGNOSIS — I495 Sick sinus syndrome: Secondary | ICD-10-CM | POA: Diagnosis not present

## 2019-11-27 LAB — CUP PACEART REMOTE DEVICE CHECK
Battery Impedance: 4095 Ohm
Battery Remaining Longevity: 18 mo
Battery Voltage: 2.71 V
Brady Statistic RV Percent Paced: 80 %
Date Time Interrogation Session: 20210720085435
Implantable Lead Implant Date: 19980116
Implantable Lead Implant Date: 19980116
Implantable Lead Location: 753859
Implantable Lead Location: 753860
Implantable Lead Model: 5034
Implantable Lead Model: 5534
Implantable Pulse Generator Implant Date: 20100122
Lead Channel Impedance Value: 1437 Ohm
Lead Channel Impedance Value: 67 Ohm
Lead Channel Pacing Threshold Amplitude: 0.5 V
Lead Channel Pacing Threshold Pulse Width: 0.4 ms
Lead Channel Setting Pacing Amplitude: 2.5 V
Lead Channel Setting Pacing Pulse Width: 0.4 ms
Lead Channel Setting Sensing Sensitivity: 5.6 mV

## 2019-11-27 NOTE — Telephone Encounter (Signed)
Spoke with Angie, she states pt moved, she assisted pt with sending remote transmission this morning.  Confirmed that transmission was received.  Some noted HVR episodes as recently as this monring at 0600.

## 2019-11-27 NOTE — Telephone Encounter (Signed)
Angie(HomeCare for Pt) is asking if Pt's location will matter for Remote Checks.   Please call Angie [phone]   thanks renee

## 2019-11-29 NOTE — Progress Notes (Signed)
Remote pacemaker transmission.   

## 2019-12-04 DIAGNOSIS — M79676 Pain in unspecified toe(s): Secondary | ICD-10-CM | POA: Diagnosis not present

## 2019-12-04 DIAGNOSIS — L84 Corns and callosities: Secondary | ICD-10-CM | POA: Diagnosis not present

## 2019-12-04 DIAGNOSIS — B351 Tinea unguium: Secondary | ICD-10-CM | POA: Diagnosis not present

## 2019-12-04 DIAGNOSIS — E1142 Type 2 diabetes mellitus with diabetic polyneuropathy: Secondary | ICD-10-CM | POA: Diagnosis not present

## 2019-12-11 DIAGNOSIS — R519 Headache, unspecified: Secondary | ICD-10-CM | POA: Diagnosis not present

## 2019-12-11 DIAGNOSIS — T17320A Food in larynx causing asphyxiation, initial encounter: Secondary | ICD-10-CM | POA: Diagnosis not present

## 2019-12-11 DIAGNOSIS — Z299 Encounter for prophylactic measures, unspecified: Secondary | ICD-10-CM | POA: Diagnosis not present

## 2019-12-11 DIAGNOSIS — N183 Chronic kidney disease, stage 3 unspecified: Secondary | ICD-10-CM | POA: Diagnosis not present

## 2019-12-11 DIAGNOSIS — E559 Vitamin D deficiency, unspecified: Secondary | ICD-10-CM | POA: Diagnosis not present

## 2019-12-11 DIAGNOSIS — E1165 Type 2 diabetes mellitus with hyperglycemia: Secondary | ICD-10-CM | POA: Diagnosis not present

## 2019-12-11 DIAGNOSIS — I10 Essential (primary) hypertension: Secondary | ICD-10-CM | POA: Diagnosis not present

## 2019-12-14 ENCOUNTER — Ambulatory Visit (INDEPENDENT_AMBULATORY_CARE_PROVIDER_SITE_OTHER): Payer: Medicare HMO | Admitting: Internal Medicine

## 2019-12-14 ENCOUNTER — Encounter: Payer: Self-pay | Admitting: Internal Medicine

## 2019-12-14 VITALS — BP 138/68 | HR 78 | Ht 65.0 in | Wt 130.8 lb

## 2019-12-14 DIAGNOSIS — I509 Heart failure, unspecified: Secondary | ICD-10-CM | POA: Diagnosis not present

## 2019-12-14 DIAGNOSIS — M19011 Primary osteoarthritis, right shoulder: Secondary | ICD-10-CM | POA: Diagnosis not present

## 2019-12-14 DIAGNOSIS — Z9181 History of falling: Secondary | ICD-10-CM

## 2019-12-14 DIAGNOSIS — G8929 Other chronic pain: Secondary | ICD-10-CM

## 2019-12-14 DIAGNOSIS — I5032 Chronic diastolic (congestive) heart failure: Secondary | ICD-10-CM | POA: Diagnosis not present

## 2019-12-14 DIAGNOSIS — Z950 Presence of cardiac pacemaker: Secondary | ICD-10-CM | POA: Diagnosis not present

## 2019-12-14 DIAGNOSIS — I4821 Permanent atrial fibrillation: Secondary | ICD-10-CM | POA: Diagnosis not present

## 2019-12-14 DIAGNOSIS — M25511 Pain in right shoulder: Secondary | ICD-10-CM

## 2019-12-14 DIAGNOSIS — I110 Hypertensive heart disease with heart failure: Secondary | ICD-10-CM | POA: Diagnosis not present

## 2019-12-14 DIAGNOSIS — E119 Type 2 diabetes mellitus without complications: Secondary | ICD-10-CM | POA: Diagnosis not present

## 2019-12-14 DIAGNOSIS — I10 Essential (primary) hypertension: Secondary | ICD-10-CM | POA: Diagnosis not present

## 2019-12-14 LAB — CUP PACEART INCLINIC DEVICE CHECK
Battery Impedance: 4202 Ohm
Battery Remaining Longevity: 17 mo
Battery Voltage: 2.71 V
Brady Statistic RV Percent Paced: 79 %
Date Time Interrogation Session: 20210806120012
Implantable Lead Implant Date: 19980116
Implantable Lead Implant Date: 19980116
Implantable Lead Location: 753859
Implantable Lead Location: 753860
Implantable Lead Model: 5034
Implantable Lead Model: 5534
Implantable Pulse Generator Implant Date: 20100122
Lead Channel Impedance Value: 1406 Ohm
Lead Channel Impedance Value: 67 Ohm
Lead Channel Pacing Threshold Amplitude: 0.5 V
Lead Channel Pacing Threshold Pulse Width: 0.4 ms
Lead Channel Sensing Intrinsic Amplitude: 15.67 mV
Lead Channel Setting Pacing Amplitude: 2.5 V
Lead Channel Setting Pacing Pulse Width: 0.4 ms
Lead Channel Setting Sensing Sensitivity: 5.6 mV

## 2019-12-14 MED ORDER — APIXABAN 2.5 MG PO TABS: 2.5000 mg | ORAL_TABLET | Freq: Two times a day (BID) | ORAL | 6 refills | Status: DC

## 2019-12-14 NOTE — Patient Instructions (Addendum)
Medication Instructions:   Decrease Eliquis to 2.5mg  twice a day.  Continue all other medications.    Labwork: none  Testing/Procedures:  Right shoulder x-ray.  Office will contact with results via phone or letter.    Follow-Up: 1 year   Any Other Special Instructions Will Be Listed Below (If Applicable).  If you need a refill on your cardiac medications before your next appointment, please call your pharmacy.

## 2019-12-14 NOTE — Progress Notes (Signed)
PCP: Monico Blitz, MD Primary Cardiologist: Dr Harl Bowie Primary EP:  Dr Rayann Heman  History is provided by patient with assistance of a caregiver today  Megan Walsh is a 86 y.o. female who presents today for routine electrophysiology followup.  Since last being seen in our clinic, the patient reports doing reasonably well.  She is not very active.  She has had several falls over the past few months.  She fell on her R should about 6 weeks ago and has had pain since that time.  She feels that her ROM is mostly preserved but is very concerned about this.  Dr Manuella Ghazi ordered a head CT which she did not comply with.  I have advised compliance. Today, she denies symptoms of palpitations, chest pain, dizziness, presyncope, or syncope.  Edema and SOB are stable. The patient is otherwise without complaint today.   Past Medical History:  Diagnosis Date  . Anxiety   . Arthritis   . CHF (congestive heart failure) ([place])   . Coronary artery disease   . Diabetes mellitus without complication ([place])   . Dysrhythmia    a-fib  . Fatty tumor   . History of gout   . Hypertension   . Myocardial infarction ([place])   . Permanent atrial fibrillation ([place])   . Presence of permanent cardiac pacemaker    MDT ADDR01/Implanted: 05/31/08  . Sleep apnea    cannot tolerate CPAP  . Symptomatic bradycardia    Past Surgical History:  Procedure Laterality Date  . ABDOMINAL HYSTERECTOMY    . BROW LIFT Bilateral 03/10/2018   Procedure: BLEPHAROPLASTY BILATERAL UPPER EYELIDS;  Surgeon: Baruch Goldmann, MD;  Location: [facility];  Service: Ophthalmology;  Laterality: Bilateral;  . CATARACT EXTRACTION W/PHACO Left 10/17/2017   Procedure: CATARACT EXTRACTION PHACO AND INTRAOCULAR LENS PLACEMENT LEFT EYE;  Surgeon: Tonny Branch, MD;  Location: [facility];  Service: Ophthalmology;  Laterality: Left;  CDE: 11.64  . CATARACT EXTRACTION W/PHACO Right 10/31/2017   Procedure: CATARACT EXTRACTION PHACO AND INTRAOCULAR LENS PLACEMENT RIGHT EYE;   Surgeon: Tonny Branch, MD;  Location: [facility];  Service: Ophthalmology;  Laterality: Right;  CDE: 11.19  . fatty tumor removal to left arm Right   . PACEMAKER GENERATOR CHANGE  05/31/08   at [facility]: MDT ADDR01/Implanted: 05/31/08 as gen change by Dr Reinaldo Berber  . PACEMAKER INSERTION  1992    ROS- all systems are reviewed and negative except as per HPI above  Current Outpatient Medications  Medication Sig Dispense Refill  . acetaminophen (TYLENOL) 650 MG CR tablet Take 650 mg by mouth 2 (two) times daily as needed for pain.     Marland Kitchen allopurinol (ZYLOPRIM) 300 MG tablet Take 300 mg by mouth daily.    Marland Kitchen amoxicillin (AMOXIL) 500 MG capsule Take 2,000 mg by mouth See admin instructions. Take 2000 mg by mouth 1 hour prior to dental appointment    . ascorbic acid (VITAMIN C) 500 MG tablet Take 500 mg by mouth daily.    Marland Kitchen ELIQUIS 5 MG TABS tablet TAKE 1 TABLET BY MOUTH TWICE DAILY 324 tablet 1  . folic acid (FOLVITE) 1 MG tablet Take 1 mg by mouth daily.    . furosemide (LASIX) 20 MG tablet Take 40 mg by mouth 2 (two) times daily.     Marland Kitchen gabapentin (NEURONTIN) 100 MG capsule Take 100 mg by mouth 2 (two) times daily.    . potassium chloride SA (K-DUR,KLOR-CON) 20 MEQ tablet Take 20 mEq by mouth daily.    Marland Kitchen  simvastatin (ZOCOR) 40 MG tablet Take 40 mg by mouth daily.    . Vitamin D, Ergocalciferol, (DRISDOL) 1.25 MG (50000 UNIT) CAPS capsule Take 50,000 Units by mouth every 7 (seven) days.     No current facility-administered medications for this visit.    Physical Exam: Vitals:   12/14/19 1146  BP: 138/68  Pulse: 78  SpO2: 98%  Weight: 130 lb 12.8 oz (59.3 kg)  Height: 5\' 5"  (1.651 m)    GEN- The patient is elderly and frail appearing, alert and oriented x 3 today.  Caregiver assists with history  Head- normocephalic, atraumatic Eyes-  Sclera clear, conjunctiva pink Ears- hearing intact Oropharynx- clear Lungs-  , normal work of breathing Chest- pacemaker pocket is well healed Heart- Regular  rate and rhythm (paced) GI- soft  Extremities- no clubbing, cyanosis, or edema R shoulder is painful though ROM appears reasonably preserved Pacemaker interrogation- reviewed in detail today,  See PACEART report   Assessment and Plan:  1. Symptomatic bradycardia  Normal pacemaker function See Pace Art report No changes today she is not device dependant today  2. Permanent afib Rate controlled On eliquis for chads2vasc score of 7 Reduce eliquis to 2.5mg  daily given age > 75 and Wt >60 kg (59.3 kg today)  3. Chronic diastolic dysfunction Stable No change required today  4. HTN Stable No change required today  5. R shoulder pain Will order plain films at the request of patient and caregiver Follow-up with Dr Manuella Ghazi is advised  Risks, benefits and potential toxicities for medications prescribed and/or refilled reviewed with patient today.   Thompson Grayer MD, [facility] 12/14/2019 11:59 AM

## 2019-12-18 DIAGNOSIS — E109 Type 1 diabetes mellitus without complications: Secondary | ICD-10-CM | POA: Diagnosis not present

## 2020-01-04 DIAGNOSIS — G319 Degenerative disease of nervous system, unspecified: Secondary | ICD-10-CM | POA: Diagnosis not present

## 2020-01-04 DIAGNOSIS — R519 Headache, unspecified: Secondary | ICD-10-CM | POA: Diagnosis not present

## 2020-01-07 DIAGNOSIS — M5412 Radiculopathy, cervical region: Secondary | ICD-10-CM | POA: Diagnosis not present

## 2020-01-22 ENCOUNTER — Ambulatory Visit: Payer: Medicare HMO | Admitting: Cardiology

## 2020-01-24 ENCOUNTER — Other Ambulatory Visit: Payer: Self-pay | Admitting: Nephrology

## 2020-01-24 DIAGNOSIS — N170 Acute kidney failure with tubular necrosis: Secondary | ICD-10-CM

## 2020-01-24 DIAGNOSIS — N189 Chronic kidney disease, unspecified: Secondary | ICD-10-CM

## 2020-02-05 ENCOUNTER — Ambulatory Visit (HOSPITAL_COMMUNITY)
Admission: RE | Admit: 2020-02-05 | Discharge: 2020-02-05 | Disposition: A | Discharge status: Home / Self Care | Payer: Medicare Other | Source: Ambulatory Visit | Attending: Nephrology | Admitting: Nephrology

## 2020-02-05 ENCOUNTER — Other Ambulatory Visit: Payer: Self-pay

## 2020-02-05 DIAGNOSIS — N170 Acute kidney failure with tubular necrosis: Secondary | ICD-10-CM | POA: Diagnosis present

## 2020-02-05 DIAGNOSIS — N189 Chronic kidney disease, unspecified: Secondary | ICD-10-CM

## 2020-02-06 ENCOUNTER — Encounter: Payer: Self-pay | Admitting: *Deleted

## 2020-02-06 ENCOUNTER — Ambulatory Visit (INDEPENDENT_AMBULATORY_CARE_PROVIDER_SITE_OTHER): Payer: Medicare Other | Admitting: Cardiology

## 2020-02-06 ENCOUNTER — Encounter: Payer: Self-pay | Admitting: Cardiology

## 2020-02-06 VITALS — BP 130/60 | HR 70 | Ht 65.0 in | Wt 135.2 lb

## 2020-02-06 DIAGNOSIS — Z23 Encounter for immunization: Secondary | ICD-10-CM

## 2020-02-06 DIAGNOSIS — Z950 Presence of cardiac pacemaker: Secondary | ICD-10-CM | POA: Diagnosis not present

## 2020-02-06 DIAGNOSIS — I4891 Unspecified atrial fibrillation: Secondary | ICD-10-CM | POA: Diagnosis not present

## 2020-02-06 DIAGNOSIS — I5032 Chronic diastolic (congestive) heart failure: Secondary | ICD-10-CM

## 2020-02-06 MED ORDER — FUROSEMIDE 20 MG PO TABS
20.0000 mg | ORAL_TABLET | Freq: Every day | ORAL | 6 refills | Status: DC
Start: 2020-02-06 — End: 2020-08-07

## 2020-02-06 MED ORDER — FUROSEMIDE 20 MG PO TABS
20.0000 mg | ORAL_TABLET | Freq: Every day | ORAL | Status: DC
Start: 2020-02-06 — End: 2020-02-06

## 2020-02-06 NOTE — Patient Instructions (Addendum)
Medication Instructions:   Decrease Lasix to 20mg  daily - may take an extra 1/2 tab as needed for severe swelling.   Continue all other medications.    Labwork: none  Testing/Procedures: none  Follow-Up: 6 months   Any Other Special Instructions Will Be Listed Below (If Applicable).  If you need a refill on your cardiac medications before your next appointment, please call your pharmacy.

## 2020-02-06 NOTE — Progress Notes (Signed)
Clinical Summary Megan Walsh is a 86 y.o.female seen today for follow up of the following medical problems.  1. Chronic diastolic HF/Lymphedema/Leg swelling - admit to [facility] 07/2017 with fluid overload -07/2017 echo showed LVEF 55-60%. Diuresed with improved symptoms  - completed lymphedema clinic in 01/2018. Has home compression stockings and pump.    - swelling is imrpvoing. Wearing compression and pumps at home for her lymphedema - compliant with lasix.    2. Permanent pacemaker -12/2019 normal function - no recent symptoms  3. Afib - no bleeding on eliquis  4. Hypertrophic CM - echo with severe asymmetric septal hypertrophy in 2016, no gradient. - no symptoms   5. HTN -bp meds previously lowered due to issues with low bp's -still with some low bps at times, her home aids reprots SBP occasioanlly high 80s  - home bp's usually 130s/70s    6. Hyperlipidemia -labs followed by pcp - compliant with statin  7. CKD - followed by Megan Walsh   Past Medical History:  Diagnosis Date  . Anxiety   . Arthritis   . CHF (congestive heart failure) ([place])   . Coronary artery disease   . Diabetes mellitus without complication ([place])   . Dysrhythmia    a-fib  . Fatty tumor   . History of gout   . Hypertension   . Myocardial infarction ([place])   . Permanent atrial fibrillation ([place])   . Presence of permanent cardiac pacemaker    MDT ADDR01/Implanted: 05/31/08  . Sleep apnea    cannot tolerate CPAP  . Symptomatic bradycardia      Allergies  Allergen Reactions  . Atorvastatin Swelling and Cough     Current Outpatient Medications  Medication Sig Dispense Refill  . acetaminophen (TYLENOL) 650 MG CR tablet Take 650 mg by mouth 2 (two) times daily as needed for pain.     Marland Kitchen allopurinol (ZYLOPRIM) 300 MG tablet Take 300 mg by mouth daily.    Marland Kitchen amoxicillin (AMOXIL) 500 MG capsule Take 2,000 mg by mouth See admin instructions. Take 2000 mg by mouth 1  hour prior to dental appointment    . apixaban (ELIQUIS) 2.5 MG TABS tablet Take 1 tablet (2.5 mg total) by mouth 2 (two) times daily. 60 tablet 6  . ascorbic acid (VITAMIN C) 500 MG tablet Take 500 mg by mouth daily.    . folic acid (FOLVITE) 1 MG tablet Take 1 mg by mouth daily.    . furosemide (LASIX) 20 MG tablet Take 40 mg by mouth 2 (two) times daily.     Marland Kitchen gabapentin (NEURONTIN) 100 MG capsule Take 100 mg by mouth 2 (two) times daily.    . potassium chloride SA (K-DUR,KLOR-CON) 20 MEQ tablet Take 20 mEq by mouth daily.    . simvastatin (ZOCOR) 40 MG tablet Take 40 mg by mouth daily.    . Vitamin D, Ergocalciferol, (DRISDOL) 1.25 MG (50000 UNIT) CAPS capsule Take 50,000 Units by mouth every 7 (seven) days.     No current facility-administered medications for this visit.     Past Surgical History:  Procedure Laterality Date  . ABDOMINAL HYSTERECTOMY    . BROW LIFT Bilateral 03/10/2018   Procedure: BLEPHAROPLASTY BILATERAL UPPER EYELIDS;  Surgeon: Baruch Goldmann, MD;  Location: [facility];  Service: Ophthalmology;  Laterality: Bilateral;  . CATARACT EXTRACTION W/PHACO Left 10/17/2017   Procedure: CATARACT EXTRACTION PHACO AND INTRAOCULAR LENS PLACEMENT LEFT EYE;  Surgeon: Tonny , MD;  Location: [facility];  Service:  Ophthalmology;  Laterality: Left;  CDE: 11.64  . CATARACT EXTRACTION W/PHACO Right 10/31/2017   Procedure: CATARACT EXTRACTION PHACO AND INTRAOCULAR LENS PLACEMENT RIGHT EYE;  Surgeon: Tonny Thompson Mckim, MD;  Location: [facility];  Service: Ophthalmology;  Laterality: Right;  CDE: 11.19  . fatty tumor removal to left arm Right   . PACEMAKER GENERATOR CHANGE  05/31/08   at [facility]: MDT ADDR01/Implanted: 05/31/08 as gen change by Megan Reinaldo Berber  . PACEMAKER INSERTION  1992     Allergies  Allergen Reactions  . Atorvastatin Swelling and Cough      Family History  Problem Relation Age of Onset  . Heart failure Mother      Social History Ms. Hauth reports that she quit smoking  about 45 years ago. Her smoking use included cigarettes. She started smoking about 75 years ago. She has a 30.00 pack-year smoking history. She has never used smokeless tobacco. Ms. Keeling reports no history of alcohol use.   Review of Systems CONSTITUTIONAL: No weight loss, fever, chills, weakness or fatigue.  HEENT: Eyes: No visual loss, blurred vision, double vision or yellow sclerae.No hearing loss, sneezing, congestion, runny nose or sore throat.  SKIN: No rash or itching.  CARDIOVASCULAR: per hpi RESPIRATORY: No shortness of breath, cough or sputum.  GASTROINTESTINAL: No anorexia, nausea, vomiting or diarrhea. No abdominal pain or blood.  GENITOURINARY: No burning on urination, no polyuria NEUROLOGICAL: No headache, dizziness, syncope, paralysis, ataxia, numbness or tingling in the extremities. No change in bowel or bladder control.  MUSCULOSKELETAL: No muscle, back pain, joint pain or stiffness.  LYMPHATICS: No enlarged nodes. No history of splenectomy.  PSYCHIATRIC: No history of depression or anxiety.  ENDOCRINOLOGIC: No reports of sweating, cold or heat intolerance. No polyuria or polydipsia.  Marland Kitchen   Physical Examination Today's Vitals   02/06/20 1020  BP: 130/60  Pulse: 70  SpO2: 97%  Weight: 135 lb 3.2 oz (61.3 kg)  Height: 5\' 5"  (1.651 m)   Body mass index is 22.5 kg/m.  Gen: resting comfortably, no acute distress HEENT: no scleral icterus, pupils equal round and reactive, no palptable cervical adenopathy,  CV: RRR, no m/r/g, no jvd Resp: Clear to auscultation bilaterally GI: abdomen is soft, non-tender, non-distended, normal bowel sounds, no hepatosplenomegaly MSK: extremities are warm, 1+ bilateral LE edema Skin: warm, no rash Neuro:  no focal deficits Psych: appropriate affect   Diagnostic Studies 09/2014 echo Study Conclusions  - Left ventricle: The cavity size was normal. There was severe asymmetric hypertrophy of the septum. Systolic function  was normal. The estimated ejection fraction was in the range of 60% to 65%. Wall motion was normal; there were no regional wall motion abnormalities. - Aortic valve: There was mild regurgitation. - Mitral valve: There was mild regurgitation directed centrally. - Left atrium: The atrium was moderately to severely dilated. - Tricuspid valve: There was moderate regurgitation. - Pulmonary arteries: Systolic pressure was mildly increased. PA peak pressure: 41 mm Hg (S).  Impressions:  - Appearance is consistent with hypertrophic cardiomyopathy with no outflow tract obstruction (at rest).    Assessment and Plan  1.Chronic diastolic HF/LE edema/Lymphedema -overall stable, we will lower her lasix to 20mg  daily, may take additional as needed for severe swelling   2. Afib - no symptoms, continue current meds  3. Permanent pacemaker -no symptoms, continue to follow in device clinic  4. HTN -bp at goal, continue current meds     [name], M.D.

## 2020-02-26 ENCOUNTER — Telehealth: Payer: Self-pay | Admitting: Internal Medicine

## 2020-02-26 ENCOUNTER — Ambulatory Visit (INDEPENDENT_AMBULATORY_CARE_PROVIDER_SITE_OTHER): Payer: Medicare Other

## 2020-02-26 DIAGNOSIS — I495 Sick sinus syndrome: Secondary | ICD-10-CM

## 2020-02-26 LAB — CUP PACEART REMOTE DEVICE CHECK
Battery Impedance: 4875 Ohm
Battery Remaining Longevity: 14 mo
Battery Voltage: 2.69 V
Brady Statistic RV Percent Paced: 41 %
Date Time Interrogation Session: 20211019124523
Implantable Lead Implant Date: 19980116
Implantable Lead Implant Date: 19980116
Implantable Lead Location: 753859
Implantable Lead Location: 753860
Implantable Lead Model: 5034
Implantable Lead Model: 5534
Implantable Pulse Generator Implant Date: 20100122
Lead Channel Impedance Value: 1282 Ohm
Lead Channel Impedance Value: 67 Ohm
Lead Channel Pacing Threshold Amplitude: 0.5 V
Lead Channel Pacing Threshold Pulse Width: 0.4 ms
Lead Channel Setting Pacing Amplitude: 2.5 V
Lead Channel Setting Pacing Pulse Width: 0.4 ms
Lead Channel Setting Sensing Sensitivity: 4 mV

## 2020-02-26 NOTE — Telephone Encounter (Signed)
Patient missed her transmit this AM. Her aid Angie wanted to know if they can do transmit now. Please call [phone].

## 2020-02-26 NOTE — Telephone Encounter (Signed)
Transmission received.

## 2020-03-03 NOTE — Progress Notes (Signed)
Remote pacemaker transmission.   

## 2020-03-24 ENCOUNTER — Emergency Department (HOSPITAL_COMMUNITY): Payer: Medicare Other

## 2020-03-24 ENCOUNTER — Encounter (HOSPITAL_COMMUNITY): Payer: Self-pay | Admitting: Emergency Medicine

## 2020-03-24 ENCOUNTER — Emergency Department (HOSPITAL_COMMUNITY)
Admission: EM | Admit: 2020-03-24 | Discharge: 2020-03-24 | Disposition: A | Discharge status: Home / Self Care | Payer: Medicare Other | Attending: Emergency Medicine | Admitting: Emergency Medicine

## 2020-03-24 ENCOUNTER — Other Ambulatory Visit: Payer: Self-pay

## 2020-03-24 DIAGNOSIS — Z86018 Personal history of other benign neoplasm: Secondary | ICD-10-CM | POA: Insufficient documentation

## 2020-03-24 DIAGNOSIS — I110 Hypertensive heart disease with heart failure: Secondary | ICD-10-CM | POA: Insufficient documentation

## 2020-03-24 DIAGNOSIS — I2510 Atherosclerotic heart disease of native coronary artery without angina pectoris: Secondary | ICD-10-CM | POA: Insufficient documentation

## 2020-03-24 DIAGNOSIS — I509 Heart failure, unspecified: Secondary | ICD-10-CM | POA: Diagnosis not present

## 2020-03-24 DIAGNOSIS — Z950 Presence of cardiac pacemaker: Secondary | ICD-10-CM | POA: Diagnosis not present

## 2020-03-24 DIAGNOSIS — Z87891 Personal history of nicotine dependence: Secondary | ICD-10-CM | POA: Diagnosis not present

## 2020-03-24 DIAGNOSIS — R109 Unspecified abdominal pain: Secondary | ICD-10-CM | POA: Insufficient documentation

## 2020-03-24 DIAGNOSIS — Z79899 Other long term (current) drug therapy: Secondary | ICD-10-CM | POA: Diagnosis not present

## 2020-03-24 DIAGNOSIS — E860 Dehydration: Secondary | ICD-10-CM | POA: Insufficient documentation

## 2020-03-24 DIAGNOSIS — R112 Nausea with vomiting, unspecified: Secondary | ICD-10-CM | POA: Diagnosis not present

## 2020-03-24 DIAGNOSIS — R531 Weakness: Secondary | ICD-10-CM | POA: Diagnosis present

## 2020-03-24 DIAGNOSIS — Z7901 Long term (current) use of anticoagulants: Secondary | ICD-10-CM | POA: Insufficient documentation

## 2020-03-24 LAB — COMPREHENSIVE METABOLIC PANEL
ALT: 14 U/L (ref 0–44)
AST: 32 U/L (ref 15–41)
Albumin: 3.8 g/dL (ref 3.5–5.0)
Alkaline Phosphatase: 65 U/L (ref 38–126)
Anion gap: 9 (ref 5–15)
BUN: 16 mg/dL (ref 8–23)
CO2: 25 mmol/L (ref 22–32)
Calcium: 9.3 mg/dL (ref 8.9–10.3)
Chloride: 104 mmol/L (ref 98–111)
Creatinine, Ser: 1.06 mg/dL — ABNORMAL HIGH (ref 0.44–1.00)
GFR, Estimated: 51 mL/min — ABNORMAL LOW (ref >60)
Glucose, Bld: 150 mg/dL — ABNORMAL HIGH (ref 70–99)
Potassium: 4.4 mmol/L (ref 3.5–5.1)
Sodium: 138 mmol/L (ref 135–145)
Total Bilirubin: 1.1 mg/dL (ref 0.3–1.2)
Total Protein: 6.9 g/dL (ref 6.5–8.1)

## 2020-03-24 LAB — URINALYSIS, ROUTINE W REFLEX MICROSCOPIC
Bacteria, UA: NONE SEEN
Bilirubin Urine: NEGATIVE
Glucose, UA: NEGATIVE mg/dL
Hgb urine dipstick: NEGATIVE
Ketones, ur: NEGATIVE mg/dL
Leukocytes,Ua: NEGATIVE
Nitrite: NEGATIVE
Protein, ur: 30 mg/dL — AB
Specific Gravity, Urine: 1.042 — ABNORMAL HIGH (ref 1.005–1.030)
pH: 6 (ref 5.0–8.0)

## 2020-03-24 LAB — CBC WITH DIFFERENTIAL/PLATELET
Abs Immature Granulocytes: 0.01 10*3/uL (ref 0.00–0.07)
Basophils Absolute: 0 10*3/uL (ref 0.0–0.1)
Basophils Relative: 1 %
Eosinophils Absolute: 0.1 10*3/uL (ref 0.0–0.5)
Eosinophils Relative: 3 %
HCT: 40.5 % (ref 36.0–46.0)
Hemoglobin: 12.4 g/dL (ref 12.0–15.0)
Immature Granulocytes: 0 %
Lymphocytes Relative: 23 %
Lymphs Abs: 0.7 10*3/uL (ref 0.7–4.0)
MCH: 27.7 pg (ref 26.0–34.0)
MCHC: 30.6 g/dL (ref 30.0–36.0)
MCV: 90.6 fL (ref 80.0–100.0)
Monocytes Absolute: 0.2 10*3/uL (ref 0.1–1.0)
Monocytes Relative: 8 %
Neutro Abs: 2.1 10*3/uL (ref 1.7–7.7)
Neutrophils Relative %: 65 %
Platelets: 208 10*3/uL (ref 150–400)
RBC: 4.47 MIL/uL (ref 3.87–5.11)
RDW: 12.8 % (ref 11.5–15.5)
WBC: 3.2 10*3/uL — ABNORMAL LOW (ref 4.0–10.5)
nRBC: 0 % (ref 0.0–0.2)

## 2020-03-24 LAB — LIPASE, BLOOD: Lipase: 26 U/L (ref 11–51)

## 2020-03-24 MED ORDER — IOHEXOL 300 MG/ML  SOLN
75.0000 mL | Freq: Once | INTRAMUSCULAR | Status: AC | PRN
  Administered 2020-03-24: 75 mL via INTRAVENOUS

## 2020-03-24 MED ORDER — ONDANSETRON 4 MG PO TBDP: 4.0000 mg | ORAL_TABLET | Freq: Three times a day (TID) | ORAL | 0 refills | Status: DC | PRN

## 2020-03-24 MED ORDER — SODIUM CHLORIDE 0.9 % IV BOLUS
500.0000 mL | Freq: Once | INTRAVENOUS | Status: AC
  Administered 2020-03-24: 500 mL via INTRAVENOUS

## 2020-03-24 NOTE — ED Triage Notes (Signed)
Pt from home with caregiver. Called EMS for increased weakness and nausea.

## 2020-03-24 NOTE — Discharge Instructions (Addendum)
Your evaluation today has been largely reassuring.  But, it is important that you monitor your condition carefully, and do not hesitate to return to the ED if you develop new, or concerning changes in your condition. ° °Otherwise, please follow-up with your physician for appropriate ongoing care. ° °

## 2020-03-24 NOTE — ED Notes (Signed)
Pt drank 2 cups of water.  

## 2020-03-24 NOTE — ED Provider Notes (Signed)
[facility] EMERGENCY DEPARTMENT Provider Note   CSN: [id number] Arrival date & time: 03/24/20  1223     History Chief Complaint  Patient presents with  . Weakness    EDNA REDE is a 86 y.o. female.  Pt presents to the ED today with generalized weakness.  She has had some nausea and some abdominal pain.  Pt has not had any f/c.  No diarrhea or constipation.        Past Medical History:  Diagnosis Date  . Anxiety   . Arthritis   . CHF (congestive heart failure) ([place])   . Coronary artery disease   . Diabetes mellitus without complication ([place])   . Dysrhythmia    a-fib  . Fatty tumor   . History of gout   . Hypertension   . Myocardial infarction ([place])   . Permanent atrial fibrillation ([place])   . Presence of permanent cardiac pacemaker    MDT ADDR01/Implanted: 05/31/08  . Sleep apnea    cannot tolerate CPAP  . Symptomatic bradycardia     Patient Active Problem List   Diagnosis Date Noted  . Tachycardia-bradycardia ([employer]) 10/23/2014  . Midsternal chest pain 09/24/2014  . Chest pain 09/22/2014  . CAD (coronary artery disease) 09/22/2014  . Atrial fibrillation with controlled ventricular response ([place]) 09/22/2014  . HTN (hypertension) 09/22/2014  . Diabetes mellitus type 2, insulin dependent ([place]) 09/22/2014  . Pacemaker - MDT ADDR01 Implanted: 05/31/08 Serial# [id number] 09/22/2014  . History of CHF (congestive heart failure) 09/22/2014  . Acute renal failure ([place]) 09/22/2014  . Gout 09/22/2014  . Abnormal EKG     Past Surgical History:  Procedure Laterality Date  . ABDOMINAL HYSTERECTOMY    . BROW LIFT Bilateral 03/10/2018   Procedure: BLEPHAROPLASTY BILATERAL UPPER EYELIDS;  Surgeon: Baruch Goldmann, MD;  Location: [facility] ORS;  Service: Ophthalmology;  Laterality: Bilateral;  . CATARACT EXTRACTION W/PHACO Left 10/17/2017   Procedure: CATARACT EXTRACTION PHACO AND INTRAOCULAR LENS PLACEMENT LEFT EYE;  Surgeon: Tonny Branch, MD;  Location: [facility] ORS;  Service:  Ophthalmology;  Laterality: Left;  CDE: 11.64  . CATARACT EXTRACTION W/PHACO Right 10/31/2017   Procedure: CATARACT EXTRACTION PHACO AND INTRAOCULAR LENS PLACEMENT RIGHT EYE;  Surgeon: Tonny Branch, MD;  Location: [facility] ORS;  Service: Ophthalmology;  Laterality: Right;  CDE: 11.19  . fatty tumor removal to left arm Right   . PACEMAKER GENERATOR CHANGE  05/31/08   at [facility]: MDT ADDR01/Implanted: 05/31/08 as gen change by Dr Reinaldo Berber  . PACEMAKER INSERTION  1992     OB History   No obstetric history on file.     Family History  Problem Relation Age of Onset  . Heart failure Mother     Social History   Tobacco Use  . Smoking status: Former Smoker    Packs/day: 1.00    Years: 30.00    Pack years: 30.00    Types: Cigarettes    Start date: 08/22/1944    Quit date: 05/10/1974    Years since quitting: 45.9  . Smokeless tobacco: Never Used  Vaping Use  . Vaping Use: Never used  Substance Use Topics  . Alcohol use: No    Alcohol/week: 0.0 standard drinks  . Drug use: No    Home Medications Prior to Admission medications   Medication Sig Start Date End Date Taking? Authorizing Provider  acetaminophen (TYLENOL) 650 MG CR tablet Take 650 mg by mouth 2 (two) times daily as needed for pain.     [provider]  allopurinol (ZYLOPRIM) 300 MG tablet Take 300 mg by mouth daily.    [provider]  amoxicillin (AMOXIL) 500 MG capsule Take 2,000 mg by mouth See admin instructions. Take 2000 mg by mouth 1 hour prior to dental appointment    [provider]  apixaban (ELIQUIS) 2.5 MG TABS tablet Take 1 tablet (2.5 mg total) by mouth 2 (two) times daily. 12/14/19   Allred, Jeneen Rinks, MD  ascorbic acid (VITAMIN C) 500 MG tablet Take 500 mg by mouth daily.    [provider]  folic acid (FOLVITE) 1 MG tablet Take 1 mg by mouth daily.    [provider]  furosemide (LASIX) 20 MG tablet Take 1 tablet (20 mg total) by mouth daily. (may take an extra 1/2 tab as needed  for severe swelling) 02/06/20   Branch, Alphonse Guild, MD  gabapentin (NEURONTIN) 100 MG capsule Take 100 mg by mouth 2 (two) times daily.    [provider]  ondansetron (ZOFRAN ODT) 4 MG disintegrating tablet Take 1 tablet (4 mg total) by mouth every 8 (eight) hours as needed for nausea or vomiting. 03/24/20   Isla Pence, MD  potassium chloride SA (K-DUR,KLOR-CON) 20 MEQ tablet Take 20 mEq by mouth daily.    [provider]  simvastatin (ZOCOR) 40 MG tablet Take 40 mg by mouth daily.    [provider]  Vitamin D, Ergocalciferol, (DRISDOL) 1.25 MG (50000 UNIT) CAPS capsule Take 50,000 Units by mouth every 7 (seven) days.    [provider]    Allergies    Atorvastatin  Review of Systems   Review of Systems  Gastrointestinal: Positive for abdominal pain and nausea.  Neurological: Positive for weakness.  All other systems reviewed and are negative.   Physical Exam Updated Vital Signs BP (!) 191/72   Pulse 67   Temp 97.6 F (36.4 C) (Oral)   Resp 17   Ht 5\' 5"  (1.651 m)   Wt 61.2 kg   SpO2 98%   BMI 22.47 kg/m   Physical Exam Vitals and nursing note reviewed.  Constitutional:      Appearance: Normal appearance.  HENT:     Head: Normocephalic and atraumatic.     Right Ear: External ear normal.     Left Ear: External ear normal.     Nose: Nose normal.     Mouth/Throat:     Mouth: Mucous membranes are moist.     Pharynx: Oropharynx is clear.  Eyes:     Extraocular Movements: Extraocular movements intact.     Conjunctiva/sclera: Conjunctivae normal.     Pupils: Pupils are equal, round, and reactive to light.  Cardiovascular:     Rate and Rhythm: Normal rate and regular rhythm.     Pulses: Normal pulses.     Heart sounds: Normal heart sounds.  Pulmonary:     Effort: Pulmonary effort is normal.     Breath sounds: Normal breath sounds.  Abdominal:     General: Abdomen is flat. Bowel sounds are normal.     Palpations: Abdomen is  soft.     Tenderness: There is abdominal tenderness in the epigastric area.  Musculoskeletal:        General: Normal range of motion.     Cervical back: Normal range of motion and neck supple.  Skin:    General: Skin is warm.     Capillary Refill: Capillary refill takes less than 2 seconds.  Neurological:     General: No focal deficit  present.     Mental Status: She is alert and oriented to person, place, and time.  Psychiatric:        Mood and Affect: Mood normal.        Behavior: Behavior normal.     ED Results / Procedures / Treatments   Labs (all labs ordered are listed, but only abnormal results are displayed) Labs Reviewed  CBC WITH DIFFERENTIAL/PLATELET - Abnormal; Notable for the following components:      Result Value   WBC 3.2 (*)    All other components within normal limits  COMPREHENSIVE METABOLIC PANEL - Abnormal; Notable for the following components:   Glucose, Bld 150 (*)    Creatinine, Ser 1.06 (*)    GFR, Estimated 51 (*)    All other components within normal limits  URINALYSIS, ROUTINE W REFLEX MICROSCOPIC - Abnormal; Notable for the following components:   Specific Gravity, Urine 1.042 (*)    Protein, ur 30 (*)    All other components within normal limits  LIPASE, BLOOD    EKG EKG Interpretation  Date/Time:  Monday March 24 2020 12:38:32 EST Ventricular Rate:  77 PR Interval:    QRS Duration: 86 QT Interval:  392 QTC Calculation: 444 R Axis:   -43 Text Interpretation: intermittent pacing Confirmed by Isla Pence [phone]) on 03/24/2020 2:07:39 PM   Radiology No results found.  Procedures Procedures (including critical care time)  Medications Ordered in ED Medications  sodium chloride 0.9 % bolus 500 mL (0 mLs Intravenous Stopped 03/24/20 1513)  iohexol (OMNIPAQUE) 300 MG/ML solution 75 mL (75 mLs Intravenous Contrast Given 03/24/20 1456)    ED Course  I have reviewed the triage vital signs and the nursing notes.  Pertinent labs  & imaging results that were available during my care of the patient were reviewed by me and considered in my medical decision making (see chart for details).    MDM Rules/Calculators/A&P                          Pt's pacemaker interrogated.  No events today.  1 episode of ventricular high rate on 11/9.  She has 13 months of life left in the pacemaker.     Pt is feeling much better after fluids.  She is able to tolerate po fluids.  Return if worse.  F/u with pcp. Final Clinical Impression(s) / ED Diagnoses Final diagnoses:  Dehydration  Non-intractable vomiting with nausea, unspecified vomiting type    Rx / DC Orders ED Discharge Orders         Ordered    ondansetron (ZOFRAN ODT) 4 MG disintegrating tablet  Every 8 hours PRN        03/24/20 1547           Isla Pence, MD 04/03/20 [phone]

## 2020-03-24 NOTE — ED Notes (Signed)
Pt states she cannot sign her name and son left after discharge instructions.

## 2020-03-31 ENCOUNTER — Other Ambulatory Visit (HOSPITAL_COMMUNITY): Payer: Self-pay | Admitting: Neurology

## 2020-03-31 ENCOUNTER — Other Ambulatory Visit: Payer: Self-pay | Admitting: Neurology

## 2020-03-31 DIAGNOSIS — M5412 Radiculopathy, cervical region: Secondary | ICD-10-CM

## 2020-04-04 ENCOUNTER — Other Ambulatory Visit (HOSPITAL_COMMUNITY): Payer: Self-pay | Admitting: Neurology

## 2020-04-04 DIAGNOSIS — M5412 Radiculopathy, cervical region: Secondary | ICD-10-CM

## 2020-04-25 ENCOUNTER — Telehealth: Payer: Self-pay | Admitting: Cardiology

## 2020-04-25 NOTE — Telephone Encounter (Signed)
Pt c/o dizziness/faint/warm feeling all over with any activity that has gotten worse over the last month BP this morning was  101/54 HR 65 rechecked BP a few mins ago 129/66 - pt cannot stand on scale to weigh - lasix was stopped last month by pcp due to kidney function - pt aid doesn't think she has gained any weight and denies swelling - says she looks like she is losing weight

## 2020-04-25 NOTE — Telephone Encounter (Signed)
Workup would be through pcp, if they find something cardiac then would have her see [place]   J Joffre Lucks MD

## 2020-04-25 NOTE — Telephone Encounter (Signed)
Per phone call from Rollene Fare pt's aid- pt has started to feel over exerted, this morning she started to feel over heated and began to feel faint  while standing for bed bath.   This has been going on for the last month, but pt has limited mobility  She's felt very over exerted and feels like she's going to pass out.  Please call Angie @ [phone]

## 2020-05-08 ENCOUNTER — Telehealth: Payer: Self-pay | Admitting: Cardiology

## 2020-05-08 NOTE — Telephone Encounter (Signed)
Patient called and remote transmission sent with assistance ofAngie her health care aid with patient;s permission. Transmission reviewed , device function WNL, AF with controlled v-rates, + Eliquis. Patient reassured device function is normal. She has appointment with PCP this morning. Encouraged to keep appointment with PCP to evaluate intermittent dizziness and fatigue. Patient and Karoline Caldwell confirmed that she will see PCP today.

## 2020-05-08 NOTE — Telephone Encounter (Signed)
Angie Calderon -sitter - called stating that patient is fatigue. Patient is suppose to be her pacemaker switched out. She is wondering if her pacemaker is working properly.  [phone].

## 2020-05-11 NOTE — Progress Notes (Deleted)
Cardiology Office Note  Date: 05/11/2020   ID: Megan, Walsh [birth year], MRN [id number]  PCP:  Megan Blitz, MD  Cardiologist:  Megan Dolly, MD Electrophysiologist:  Megan Grayer, MD   Chief Complaint: C/O fatigue  History of Present Illness: Megan Walsh is a 87 y.o. female with a history of chronic diastolic heart failure, lymphedema, lower extremity edema, CAD, DM2, permanent atrial fibrillation, HTN, MI, Pacemaker, Hx of OSA not tolerant of CPAP, CKD.  Recent visit to [facility] ED 03/24/2020 with complaint of weakness, nausea, and abdominal pain.  Last encounter with Dr. Harl Walsh 01/29/2020.  Lower extremity edema was improving.  She is wearing compression using pneumatic compression at home for lymphedema.  She was compliant with her Lasix.  Pacemaker at last check in August 2021 showed normal function.  She had no recent symptoms.  No bleeding on Eliquis.  Prior echo 2016 no gradient secondary to hypertrophic cardiomyopathy.  No symptoms.  Having some low blood pressures at times.  She was compliant with her statin medications.  She was followed by Dr. Theador Walsh for chronic kidney disease.  Encouraged to keep appointment with PCP to evaluate intermittent dizziness and fatigue. Patient and Megan Walsh confirmed that she will see PCP today    Past Medical History:  Diagnosis Date  . Anxiety   . Arthritis   . CHF (congestive heart failure) ([place])   . Coronary artery disease   . Diabetes mellitus without complication ([place])   . Dysrhythmia    a-fib  . Fatty tumor   . History of gout   . Hypertension   . Myocardial infarction ([place])   . Permanent atrial fibrillation ([place])   . Presence of permanent cardiac pacemaker    MDT ADDR01/Implanted: 05/31/08  . Sleep apnea    cannot tolerate CPAP  . Symptomatic bradycardia     Past Surgical History:  Procedure Laterality Date  . ABDOMINAL HYSTERECTOMY    . BROW LIFT Bilateral 03/10/2018   Procedure: BLEPHAROPLASTY BILATERAL UPPER  EYELIDS;  Surgeon: Megan Goldmann, MD;  Location: [facility] ORS;  Service: Ophthalmology;  Laterality: Bilateral;  . CATARACT EXTRACTION W/PHACO Left 10/17/2017   Procedure: CATARACT EXTRACTION PHACO AND INTRAOCULAR LENS PLACEMENT LEFT EYE;  Surgeon: Megan Branch, MD;  Location: [facility] ORS;  Service: Ophthalmology;  Laterality: Left;  CDE: 11.64  . CATARACT EXTRACTION W/PHACO Right 10/31/2017   Procedure: CATARACT EXTRACTION PHACO AND INTRAOCULAR LENS PLACEMENT RIGHT EYE;  Surgeon: Megan Branch, MD;  Location: [facility];  Service: Ophthalmology;  Laterality: Right;  CDE: 11.19  . fatty tumor removal to left arm Right   . PACEMAKER GENERATOR CHANGE  05/31/08   at [facility]: MDT ADDR01/Implanted: 05/31/08 as gen change by Dr Megan Walsh  . PACEMAKER INSERTION  1992    Current Outpatient Medications  Medication Sig Dispense Refill  . acetaminophen (TYLENOL) 650 MG CR tablet Take 650 mg by mouth 2 (two) times daily as needed for pain.     Marland Kitchen allopurinol (ZYLOPRIM) 300 MG tablet Take 300 mg by mouth daily.    Marland Kitchen amoxicillin (AMOXIL) 500 MG capsule Take 2,000 mg by mouth See admin instructions. Take 2000 mg by mouth 1 hour prior to dental appointment    . apixaban (ELIQUIS) 2.5 MG TABS tablet Take 1 tablet (2.5 mg total) by mouth 2 (two) times daily. 60 tablet 6  . ascorbic acid (VITAMIN C) 500 MG tablet Take 500 mg by mouth daily.    . folic acid (FOLVITE) 1 MG tablet Take 1 mg  by mouth daily.    . furosemide (LASIX) 20 MG tablet Take 1 tablet (20 mg total) by mouth daily. (may take an extra 1/2 tab as needed for severe swelling) 45 tablet 6  . gabapentin (NEURONTIN) 100 MG capsule Take 100 mg by mouth 2 (two) times daily.    . ondansetron (ZOFRAN ODT) 4 MG disintegrating tablet Take 1 tablet (4 mg total) by mouth every 8 (eight) hours as needed for nausea or vomiting. 20 tablet 0  . potassium chloride SA (K-DUR,KLOR-CON) 20 MEQ tablet Take 20 mEq by mouth daily.    . simvastatin (ZOCOR) 40 MG tablet Take 40 mg by mouth  daily.    . Vitamin D, Ergocalciferol, (DRISDOL) 1.25 MG (50000 UNIT) CAPS capsule Take 50,000 Units by mouth every 7 (seven) days.     No current facility-administered medications for this visit.   Allergies:  Atorvastatin   Social History: The patient  reports that she quit smoking about 46 years ago. Her smoking use included cigarettes. She started smoking about 75 years ago. She has a 30.00 pack-year smoking history. She has never used smokeless tobacco. She reports that she does not drink alcohol and does not use drugs.   Family History: The patient's family history includes Heart failure in her mother.   ROS:  Please see the history of present illness. Otherwise, complete review of systems is positive for {NONE DEFAULTED:18576::"none"}.  All other systems are reviewed and negative.   Physical Exam: VS:  There were no vitals taken for this visit., BMI There is no height or weight on file to calculate BMI.  Wt Readings from Last 3 Encounters:  03/24/20 135 lb (61.2 kg)  02/06/20 135 lb 3.2 oz (61.3 kg)  12/14/19 130 lb 12.8 oz (59.3 kg)    General: Patient appears comfortable at rest. HEENT: Conjunctiva and lids normal, oropharynx clear with moist mucosa. Neck: Supple, no elevated JVP or carotid bruits, no thyromegaly. Lungs: Clear to auscultation, nonlabored breathing at rest. Cardiac: Regular rate and rhythm, no S3 or significant systolic murmur, no pericardial rub. Abdomen: Soft, nontender, no hepatomegaly, bowel sounds present, no guarding or rebound. Extremities: No pitting edema, distal pulses 2+. Skin: Warm and dry. Musculoskeletal: No kyphosis. Neuropsychiatric: Alert and oriented x3, affect grossly appropriate.  ECG:  {EKG/Telemetry Strips Reviewed:[phone]}  Recent Labwork: 03/24/2020: ALT 14; AST 32; BUN 16; Creatinine, Ser 1.06; Hemoglobin 12.4; Platelets 208; Potassium 4.4; Sodium 138     Component Value Date/Time   CHOL 124 09/23/2014 0223   TRIG 69  09/23/2014 0223   HDL 62 09/23/2014 0223   CHOLHDL 2.0 09/23/2014 0223   VLDL 14 09/23/2014 0223   LDLCALC 48 09/23/2014 0223    Other Studies Reviewed Today:   09/2014 echo Study Conclusions  - Left ventricle: The cavity size was normal. There was severe asymmetric hypertrophy of the septum. Systolic function was normal. The estimated ejection fraction was in the range of 60% to 65%. Wall motion was normal; there were no regional wall motion abnormalities. - Aortic valve: There was mild regurgitation. - Mitral valve: There was mild regurgitation directed centrally. - Left atrium: The atrium was moderately to severely dilated. - Tricuspid valve: There was moderate regurgitation. - Pulmonary arteries: Systolic pressure was mildly increased. PA peak pressure: 41 mm Hg (S).  Impressions:  - Appearance is consistent with hypertrophic cardiomyopathy with no outflow tract obstruction (at rest).    Assessment and Plan:  1. Chronic diastolic heart failure ([place])   2. Atrial  fibrillation, unspecified type (HCC)   3. Pacemaker   4. Essential hypertension    1. Chronic diastolic heart failure (HCC) ***  2. Atrial fibrillation, unspecified type (HCC) ***  3. Pacemaker ***  4. Essential hypertension ***  Medication Adjustments/Labs and Tests Ordered: Current medicines are reviewed at length with the patient today.  Concerns regarding medicines are outlined above.   Disposition: Follow-up with ***  Signed, Rennis Harding, NP 05/11/2020 9:42 PM    [facility] [address] [place], [place], [place] [address] Phone: [phone]; Fax: [phone]

## 2020-05-12 ENCOUNTER — Ambulatory Visit: Payer: Medicare Other | Admitting: Family Medicine

## 2020-05-12 DIAGNOSIS — I4891 Unspecified atrial fibrillation: Secondary | ICD-10-CM

## 2020-05-12 DIAGNOSIS — I5032 Chronic diastolic (congestive) heart failure: Secondary | ICD-10-CM

## 2020-05-12 DIAGNOSIS — Z950 Presence of cardiac pacemaker: Secondary | ICD-10-CM

## 2020-05-12 DIAGNOSIS — I10 Essential (primary) hypertension: Secondary | ICD-10-CM

## 2020-05-14 DIAGNOSIS — M1380 Other specified arthritis, unspecified site: Secondary | ICD-10-CM | POA: Diagnosis not present

## 2020-05-15 NOTE — Progress Notes (Deleted)
Cardiology Office Note  Date: 05/15/2020   ID: Jaedyn, Leight Apr 08, 1933, MRN [id number]  PCP:  Monico Blitz, MD  Cardiologist:  Carlyle Dolly, MD Electrophysiologist:  Thompson Grayer, MD   Chief Complaint: C/O fatigue  History of Present Illness: Megan Walsh is a 87 y.o. female with a history of chronic diastolic heart failure, lymphedema, lower extremity edema, CAD, DM2, permanent atrial fibrillation, HTN, MI, Pacemaker, Hx of OSA not tolerant of CPAP, CKD.  Recent visit to [facility] ED 03/24/2020 with complaint of weakness, nausea, and abdominal pain.  Last encounter with Dr. Harl Bowie 01/29/2020.  Lower extremity edema was improving.  She is wearing compression using pneumatic compression at home for lymphedema.  She was compliant with her Lasix.  Pacemaker at last check in August 2021 showed normal function.  She had no recent symptoms.  No bleeding on Eliquis.  Prior echo 2016 no gradient secondary to hypertrophic cardiomyopathy.  No symptoms.  Having some low blood pressures at times.  She was compliant with her statin medications.  She was followed by Dr. Theador Hawthorne for chronic kidney disease.  Encouraged to keep appointment with PCP to evaluate intermittent dizziness and fatigue. Patient and Angie confirmed that she will see PCP today    Past Medical History:  Diagnosis Date  . Anxiety   . Arthritis   . CHF (congestive heart failure) ([place])   . Coronary artery disease   . Diabetes mellitus without complication ([place])   . Dysrhythmia    a-fib  . Fatty tumor   . History of gout   . Hypertension   . Myocardial infarction ([place])   . Permanent atrial fibrillation ([place])   . Presence of permanent cardiac pacemaker    MDT ADDR01/Implanted: 05/31/08  . Sleep apnea    cannot tolerate CPAP  . Symptomatic bradycardia     Past Surgical History:  Procedure Laterality Date  . ABDOMINAL HYSTERECTOMY    . BROW LIFT Bilateral 03/10/2018   Procedure: BLEPHAROPLASTY BILATERAL UPPER  EYELIDS;  Surgeon: Baruch Goldmann, MD;  Location: [facility] ORS;  Service: Ophthalmology;  Laterality: Bilateral;  . CATARACT EXTRACTION W/PHACO Left 10/17/2017   Procedure: CATARACT EXTRACTION PHACO AND INTRAOCULAR LENS PLACEMENT LEFT EYE;  Surgeon: Tonny Branch, MD;  Location: [facility];  Service: Ophthalmology;  Laterality: Left;  CDE: 11.64  . CATARACT EXTRACTION W/PHACO Right 10/31/2017   Procedure: CATARACT EXTRACTION PHACO AND INTRAOCULAR LENS PLACEMENT RIGHT EYE;  Surgeon: Tonny Branch, MD;  Location: [facility];  Service: Ophthalmology;  Laterality: Right;  CDE: 11.19  . fatty tumor removal to left arm Right   . PACEMAKER GENERATOR CHANGE  05/31/08   at [facility]: MDT ADDR01/Implanted: 05/31/08 as gen change by Dr Reinaldo Berber  . PACEMAKER INSERTION  1992    Current Outpatient Medications  Medication Sig Dispense Refill  . acetaminophen (TYLENOL) 650 MG CR tablet Take 650 mg by mouth 2 (two) times daily as needed for pain.     Marland Kitchen allopurinol (ZYLOPRIM) 300 MG tablet Take 300 mg by mouth daily.    Marland Kitchen amoxicillin (AMOXIL) 500 MG capsule Take 2,000 mg by mouth See admin instructions. Take 2000 mg by mouth 1 hour prior to dental appointment    . apixaban (ELIQUIS) 2.5 MG TABS tablet Take 1 tablet (2.5 mg total) by mouth 2 (two) times daily. 60 tablet 6  . ascorbic acid (VITAMIN C) 500 MG tablet Take 500 mg by mouth daily.    . folic acid (FOLVITE) 1 MG tablet Take 1 mg  by mouth daily.    . furosemide (LASIX) 20 MG tablet Take 1 tablet (20 mg total) by mouth daily. (may take an extra 1/2 tab as needed for severe swelling) 45 tablet 6  . gabapentin (NEURONTIN) 100 MG capsule Take 100 mg by mouth 2 (two) times daily.    . ondansetron (ZOFRAN ODT) 4 MG disintegrating tablet Take 1 tablet (4 mg total) by mouth every 8 (eight) hours as needed for nausea or vomiting. 20 tablet 0  . potassium chloride SA (K-DUR,KLOR-CON) 20 MEQ tablet Take 20 mEq by mouth daily.    . simvastatin (ZOCOR) 40 MG tablet Take 40 mg by mouth  daily.    . Vitamin D, Ergocalciferol, (DRISDOL) 1.25 MG (50000 UNIT) CAPS capsule Take 50,000 Units by mouth every 7 (seven) days.     No current facility-administered medications for this visit.   Allergies:  Atorvastatin   Social History: The patient  reports that she quit smoking about 46 years ago. Her smoking use included cigarettes. She started smoking about 75 years ago. She has a 30.00 pack-year smoking history. She has never used smokeless tobacco. She reports that she does not drink alcohol and does not use drugs.   Family History: The patient's family history includes Heart failure in her mother.   ROS:  Please see the history of present illness. Otherwise, complete review of systems is positive for {NONE DEFAULTED:18576::"none"}.  All other systems are reviewed and negative.   Physical Exam: VS:  There were no vitals taken for this visit., BMI There is no height or weight on file to calculate BMI.  Wt Readings from Last 3 Encounters:  03/24/20 135 lb (61.2 kg)  02/06/20 135 lb 3.2 oz (61.3 kg)  12/14/19 130 lb 12.8 oz (59.3 kg)    General: Patient appears comfortable at rest. HEENT: Conjunctiva and lids normal, oropharynx clear with moist mucosa. Neck: Supple, no elevated JVP or carotid bruits, no thyromegaly. Lungs: Clear to auscultation, nonlabored breathing at rest. Cardiac: Regular rate and rhythm, no S3 or significant systolic murmur, no pericardial rub. Abdomen: Soft, nontender, no hepatomegaly, bowel sounds present, no guarding or rebound. Extremities: No pitting edema, distal pulses 2+. Skin: Warm and dry. Musculoskeletal: No kyphosis. Neuropsychiatric: Alert and oriented x3, affect grossly appropriate.  ECG:  {EKG/Telemetry Strips Reviewed:4088667575}  Recent Labwork: 03/24/2020: ALT 14; AST 32; BUN 16; Creatinine, Ser 1.06; Hemoglobin 12.4; Platelets 208; Potassium 4.4; Sodium 138     Component Value Date/Time   CHOL 124 09/23/2014 0223   TRIG 69  09/23/2014 0223   HDL 62 09/23/2014 0223   CHOLHDL 2.0 09/23/2014 0223   VLDL 14 09/23/2014 0223   LDLCALC 48 09/23/2014 0223    Other Studies Reviewed Today:   09/2014 echo Study Conclusions  - Left ventricle: The cavity size was normal. There was severe asymmetric hypertrophy of the septum. Systolic function was normal. The estimated ejection fraction was in the range of 60% to 65%. Wall motion was normal; there were no regional wall motion abnormalities. - Aortic valve: There was mild regurgitation. - Mitral valve: There was mild regurgitation directed centrally. - Left atrium: The atrium was moderately to severely dilated. - Tricuspid valve: There was moderate regurgitation. - Pulmonary arteries: Systolic pressure was mildly increased. PA peak pressure: 41 mm Hg (S).  Impressions:  - Appearance is consistent with hypertrophic cardiomyopathy with no outflow tract obstruction (at rest).    Assessment and Plan:  No diagnosis found. 1. Chronic diastolic heart failure (HCC) ***  2. Atrial fibrillation, unspecified type (HCC) ***  3. Pacemaker ***  4. Essential hypertension ***  Medication Adjustments/Labs and Tests Ordered: Current medicines are reviewed at length with the patient today.  Concerns regarding medicines are outlined above.   Disposition: Follow-up with ***  Signed, Levell July, NP 05/15/2020 5:04 PM    [place] at [facility], [place], [place] [address] Phone: [phone]; Fax: [phone]

## 2020-05-16 ENCOUNTER — Ambulatory Visit: Payer: Medicare Other | Admitting: Family Medicine

## 2020-05-19 ENCOUNTER — Other Ambulatory Visit (HOSPITAL_COMMUNITY): Payer: Self-pay | Admitting: Neurology

## 2020-05-19 ENCOUNTER — Ambulatory Visit: Payer: Medicare Other | Admitting: Family Medicine

## 2020-05-19 ENCOUNTER — Other Ambulatory Visit: Payer: Self-pay | Admitting: Neurology

## 2020-05-19 DIAGNOSIS — M5412 Radiculopathy, cervical region: Secondary | ICD-10-CM

## 2020-05-19 DIAGNOSIS — M542 Cervicalgia: Secondary | ICD-10-CM | POA: Diagnosis not present

## 2020-05-19 DIAGNOSIS — R2689 Other abnormalities of gait and mobility: Secondary | ICD-10-CM | POA: Diagnosis not present

## 2020-05-19 DIAGNOSIS — M79609 Pain in unspecified limb: Secondary | ICD-10-CM | POA: Diagnosis not present

## 2020-05-19 DIAGNOSIS — M545 Low back pain, unspecified: Secondary | ICD-10-CM | POA: Diagnosis not present

## 2020-05-19 DIAGNOSIS — K5900 Constipation, unspecified: Secondary | ICD-10-CM | POA: Diagnosis not present

## 2020-05-27 ENCOUNTER — Telehealth: Payer: Self-pay | Admitting: Internal Medicine

## 2020-05-27 DIAGNOSIS — Z299 Encounter for prophylactic measures, unspecified: Secondary | ICD-10-CM | POA: Diagnosis not present

## 2020-05-27 DIAGNOSIS — I5032 Chronic diastolic (congestive) heart failure: Secondary | ICD-10-CM | POA: Diagnosis not present

## 2020-05-27 DIAGNOSIS — I10 Essential (primary) hypertension: Secondary | ICD-10-CM | POA: Diagnosis not present

## 2020-05-27 DIAGNOSIS — E1165 Type 2 diabetes mellitus with hyperglycemia: Secondary | ICD-10-CM | POA: Diagnosis not present

## 2020-05-27 DIAGNOSIS — D696 Thrombocytopenia, unspecified: Secondary | ICD-10-CM | POA: Diagnosis not present

## 2020-05-27 DIAGNOSIS — Z6821 Body mass index (BMI) 21.0-21.9, adult: Secondary | ICD-10-CM | POA: Diagnosis not present

## 2020-05-27 NOTE — Telephone Encounter (Signed)
Returning phone call.   Patients remote is scheduled for 05/27/20. Is still marked "scheduled", will wait until tomorrow to see if transmitted. Agreeable to plan.

## 2020-05-27 NOTE — Telephone Encounter (Signed)
Patient calling to see if transmission went through this morning.

## 2020-06-03 NOTE — Telephone Encounter (Signed)
Transmission was "misssed". Attempted to assist with sending transmission. During conversation phone line was disconnected. Attempted to call back twice with phone continuing to have busy signal.

## 2020-06-06 DIAGNOSIS — N183 Chronic kidney disease, stage 3 unspecified: Secondary | ICD-10-CM | POA: Diagnosis not present

## 2020-06-09 DIAGNOSIS — I130 Hypertensive heart and chronic kidney disease with heart failure and stage 1 through stage 4 chronic kidney disease, or unspecified chronic kidney disease: Secondary | ICD-10-CM | POA: Diagnosis not present

## 2020-06-09 DIAGNOSIS — I5032 Chronic diastolic (congestive) heart failure: Secondary | ICD-10-CM | POA: Diagnosis not present

## 2020-06-09 DIAGNOSIS — E1122 Type 2 diabetes mellitus with diabetic chronic kidney disease: Secondary | ICD-10-CM | POA: Diagnosis not present

## 2020-06-10 ENCOUNTER — Ambulatory Visit (INDEPENDENT_AMBULATORY_CARE_PROVIDER_SITE_OTHER): Payer: Medicare Other

## 2020-06-10 DIAGNOSIS — I495 Sick sinus syndrome: Secondary | ICD-10-CM | POA: Diagnosis not present

## 2020-06-10 LAB — CUP PACEART REMOTE DEVICE CHECK
Battery Impedance: 5624 Ohm
Battery Remaining Longevity: 11 mo
Battery Voltage: 2.68 V
Brady Statistic RV Percent Paced: 29 %
Date Time Interrogation Session: 20220201092252
Implantable Lead Implant Date: 19980116
Implantable Lead Implant Date: 19980116
Implantable Lead Location: 753859
Implantable Lead Location: 753860
Implantable Lead Model: 5034
Implantable Lead Model: 5534
Implantable Pulse Generator Implant Date: 20100122
Lead Channel Impedance Value: 1374 Ohm
Lead Channel Impedance Value: 67 Ohm
Lead Channel Pacing Threshold Amplitude: 0.75 V
Lead Channel Pacing Threshold Pulse Width: 0.4 ms
Lead Channel Setting Pacing Amplitude: 2.5 V
Lead Channel Setting Pacing Pulse Width: 0.4 ms
Lead Channel Setting Sensing Sensitivity: 5.6 mV

## 2020-06-10 NOTE — Telephone Encounter (Signed)
Transmission received.

## 2020-06-11 DIAGNOSIS — E1122 Type 2 diabetes mellitus with diabetic chronic kidney disease: Secondary | ICD-10-CM | POA: Diagnosis not present

## 2020-06-11 DIAGNOSIS — N189 Chronic kidney disease, unspecified: Secondary | ICD-10-CM | POA: Diagnosis not present

## 2020-06-11 DIAGNOSIS — I129 Hypertensive chronic kidney disease with stage 1 through stage 4 chronic kidney disease, or unspecified chronic kidney disease: Secondary | ICD-10-CM | POA: Diagnosis not present

## 2020-06-11 DIAGNOSIS — E211 Secondary hyperparathyroidism, not elsewhere classified: Secondary | ICD-10-CM | POA: Diagnosis not present

## 2020-06-11 DIAGNOSIS — I5032 Chronic diastolic (congestive) heart failure: Secondary | ICD-10-CM | POA: Diagnosis not present

## 2020-06-14 DIAGNOSIS — M1380 Other specified arthritis, unspecified site: Secondary | ICD-10-CM | POA: Diagnosis not present

## 2020-06-19 NOTE — Progress Notes (Signed)
Remote pacemaker transmission.   

## 2020-06-26 DIAGNOSIS — M5412 Radiculopathy, cervical region: Secondary | ICD-10-CM | POA: Diagnosis not present

## 2020-06-26 DIAGNOSIS — M792 Neuralgia and neuritis, unspecified: Secondary | ICD-10-CM | POA: Diagnosis not present

## 2020-06-26 DIAGNOSIS — M5416 Radiculopathy, lumbar region: Secondary | ICD-10-CM | POA: Diagnosis not present

## 2020-07-08 ENCOUNTER — Other Ambulatory Visit: Payer: Self-pay | Admitting: Internal Medicine

## 2020-07-08 NOTE — Telephone Encounter (Signed)
Prescription refill request for Eliquis received. Indication: AFib Last office visit: 02/06/20 Scr: 1.06 Age: 87 Weight: 61kg  Pt has been on Low dose Eliquis.  Based on above findings will continue Eliquis 2.5mg  twice daily.  Refill approved.

## 2020-07-12 DIAGNOSIS — M1380 Other specified arthritis, unspecified site: Secondary | ICD-10-CM | POA: Diagnosis not present

## 2020-07-14 ENCOUNTER — Other Ambulatory Visit (HOSPITAL_COMMUNITY): Payer: Self-pay | Admitting: Neurology

## 2020-07-14 ENCOUNTER — Other Ambulatory Visit: Payer: Self-pay | Admitting: Neurology

## 2020-07-14 DIAGNOSIS — M5412 Radiculopathy, cervical region: Secondary | ICD-10-CM | POA: Diagnosis not present

## 2020-07-14 DIAGNOSIS — K5900 Constipation, unspecified: Secondary | ICD-10-CM | POA: Diagnosis not present

## 2020-07-14 DIAGNOSIS — R2689 Other abnormalities of gait and mobility: Secondary | ICD-10-CM | POA: Diagnosis not present

## 2020-07-14 DIAGNOSIS — M542 Cervicalgia: Secondary | ICD-10-CM | POA: Diagnosis not present

## 2020-07-14 DIAGNOSIS — M79609 Pain in unspecified limb: Secondary | ICD-10-CM | POA: Diagnosis not present

## 2020-07-14 DIAGNOSIS — M545 Low back pain, unspecified: Secondary | ICD-10-CM | POA: Diagnosis not present

## 2020-07-15 ENCOUNTER — Other Ambulatory Visit (HOSPITAL_COMMUNITY): Payer: Self-pay | Admitting: Neurology

## 2020-07-15 DIAGNOSIS — M5412 Radiculopathy, cervical region: Secondary | ICD-10-CM

## 2020-07-22 DIAGNOSIS — E1142 Type 2 diabetes mellitus with diabetic polyneuropathy: Secondary | ICD-10-CM | POA: Diagnosis not present

## 2020-07-22 DIAGNOSIS — L84 Corns and callosities: Secondary | ICD-10-CM | POA: Diagnosis not present

## 2020-07-22 DIAGNOSIS — M79676 Pain in unspecified toe(s): Secondary | ICD-10-CM | POA: Diagnosis not present

## 2020-07-22 DIAGNOSIS — B351 Tinea unguium: Secondary | ICD-10-CM | POA: Diagnosis not present

## 2020-08-06 ENCOUNTER — Ambulatory Visit (HOSPITAL_COMMUNITY)
Admission: RE | Admit: 2020-08-06 | Discharge: 2020-08-06 | Disposition: A | Discharge status: Home / Self Care | Payer: Medicare Other | Source: Ambulatory Visit | Attending: Neurology | Admitting: Neurology

## 2020-08-06 ENCOUNTER — Other Ambulatory Visit: Payer: Self-pay

## 2020-08-06 DIAGNOSIS — I10 Essential (primary) hypertension: Secondary | ICD-10-CM | POA: Diagnosis not present

## 2020-08-06 DIAGNOSIS — E1122 Type 2 diabetes mellitus with diabetic chronic kidney disease: Secondary | ICD-10-CM | POA: Diagnosis not present

## 2020-08-06 DIAGNOSIS — M5412 Radiculopathy, cervical region: Secondary | ICD-10-CM | POA: Insufficient documentation

## 2020-08-06 DIAGNOSIS — M542 Cervicalgia: Secondary | ICD-10-CM | POA: Diagnosis not present

## 2020-08-06 DIAGNOSIS — I5032 Chronic diastolic (congestive) heart failure: Secondary | ICD-10-CM | POA: Diagnosis not present

## 2020-08-07 ENCOUNTER — Ambulatory Visit (INDEPENDENT_AMBULATORY_CARE_PROVIDER_SITE_OTHER): Payer: Medicare Other | Admitting: Cardiology

## 2020-08-07 ENCOUNTER — Encounter: Payer: Self-pay | Admitting: *Deleted

## 2020-08-07 ENCOUNTER — Encounter: Payer: Self-pay | Admitting: Cardiology

## 2020-08-07 VITALS — BP 138/70 | HR 76 | Ht 65.0 in | Wt 122.6 lb

## 2020-08-07 DIAGNOSIS — I4891 Unspecified atrial fibrillation: Secondary | ICD-10-CM | POA: Diagnosis not present

## 2020-08-07 DIAGNOSIS — Z950 Presence of cardiac pacemaker: Secondary | ICD-10-CM

## 2020-08-07 DIAGNOSIS — I5032 Chronic diastolic (congestive) heart failure: Secondary | ICD-10-CM | POA: Diagnosis not present

## 2020-08-07 NOTE — Progress Notes (Signed)
Clinical Summary Megan Walsh is a 87 y.o.female  seen today for follow up of the following medical problems.  1. Chronic diastolic HF/Lymphedema/Leg swelling - admit to [facility] 07/2017 with fluid overload -07/2017 echo showed LVEF 55-60%. Diuresed with improved symptoms  - completed lymphedema clinic in 01/2018. Has home compression stockings and pump.   - swelling continues to improve - uses lymphedema pump - she is off lasix, no recurrent swelling off diuretic  2. Permanent pacemaker - 06/2020 normal device check - no recent symptoms  3. Afib - rare infrequent palpitations.   4. Hypertrophic CM - echo with severe asymmetric septal hypertrophy in 2016, no gradient. - no symptoms   5. Hyperlipidemia -compliant with statin - labs followed by pcp  6. CKD - followed by Dr Theador Hawthorne   Past Medical History:  Diagnosis Date  . Anxiety   . Arthritis   . CHF (congestive heart failure) ([place])   . Coronary artery disease   . Diabetes mellitus without complication ([place])   . Dysrhythmia    a-fib  . Fatty tumor   . History of gout   . Hypertension   . Myocardial infarction ([place])   . Permanent atrial fibrillation ([place])   . Presence of permanent cardiac pacemaker    MDT ADDR01/Implanted: 05/31/08  . Sleep apnea    cannot tolerate CPAP  . Symptomatic bradycardia      Allergies  Allergen Reactions  . Atorvastatin Swelling and Cough     Current Outpatient Medications  Medication Sig Dispense Refill  . acetaminophen (TYLENOL) 650 MG CR tablet Take 650 mg by mouth 2 (two) times daily as needed for pain.     Marland Kitchen allopurinol (ZYLOPRIM) 300 MG tablet Take 300 mg by mouth daily.    Marland Kitchen amoxicillin (AMOXIL) 500 MG capsule Take 2,000 mg by mouth See admin instructions. Take 2000 mg by mouth 1 hour prior to dental appointment    . ascorbic acid (VITAMIN C) 500 MG tablet Take 500 mg by mouth daily.    Marland Kitchen ELIQUIS 2.5 MG TABS tablet TAKE 1 TABLET BY MOUTH TWICE DAILY  60 tablet 6  . folic acid (FOLVITE) 1 MG tablet Take 1 mg by mouth daily.    . furosemide (LASIX) 20 MG tablet Take 1 tablet (20 mg total) by mouth daily. (may take an extra 1/2 tab as needed for severe swelling) 45 tablet 6  . gabapentin (NEURONTIN) 100 MG capsule Take 100 mg by mouth 2 (two) times daily.    . ondansetron (ZOFRAN ODT) 4 MG disintegrating tablet Take 1 tablet (4 mg total) by mouth every 8 (eight) hours as needed for nausea or vomiting. 20 tablet 0  . potassium chloride SA (K-DUR,KLOR-CON) 20 MEQ tablet Take 20 mEq by mouth daily.    . simvastatin (ZOCOR) 40 MG tablet Take 40 mg by mouth daily.    . Vitamin D, Ergocalciferol, (DRISDOL) 1.25 MG (50000 UNIT) CAPS capsule Take 50,000 Units by mouth every 7 (seven) days.     No current facility-administered medications for this visit.     Past Surgical History:  Procedure Laterality Date  . ABDOMINAL HYSTERECTOMY    . BROW LIFT Bilateral 03/10/2018   Procedure: BLEPHAROPLASTY BILATERAL UPPER EYELIDS;  Surgeon: Baruch Goldmann, MD;  Location: [facility] OR[facility];  Service: Ophthalmology;  Laterality: Bilateral;  . CATARACT EXTRACTION W/PHACO Left 10/17/2017   Procedure: CATARACT EXTRACTION PHACO AND INTRAOCULAR LENS PLACEMENT LEFT EYE;  Surgeon: Tonny , MD;  Location: [facility];  Service: Ophthalmology;  Laterality: Left;  CDE: 11.64  . CATARACT EXTRACTION W/PHACO Right 10/31/2017   Procedure: CATARACT EXTRACTION PHACO AND INTRAOCULAR LENS PLACEMENT RIGHT EYE;  Surgeon: Tonny Ezequiel Macauley, MD;  Location: [facility] ORS;  Service: Ophthalmology;  Laterality: Right;  CDE: 11.19  . fatty tumor removal to left arm Right   . PACEMAKER GENERATOR CHANGE  05/31/08   at [facility]: MDT ADDR01/Implanted: 05/31/08 as gen change by Dr Reinaldo Berber  . PACEMAKER INSERTION  1992     Allergies  Allergen Reactions  . Atorvastatin Swelling and Cough      Family History  Problem Relation Age of Onset  . Heart failure Mother      Social History Megan Walsh reports that  she quit smoking about 46 years ago. Her smoking use included cigarettes. She started smoking about 76 years ago. She has a 30.00 pack-year smoking history. She has never used smokeless tobacco. Megan Walsh reports no history of alcohol use.   Review of Systems CONSTITUTIONAL: No weight loss, fever, chills, weakness or fatigue.  HEENT: Eyes: No visual loss, blurred vision, double vision or yellow sclerae.No hearing loss, sneezing, congestion, runny nose or sore throat.  SKIN: No rash or itching.  CARDIOVASCULAR: per hpi RESPIRATORY: No shortness of breath, cough or sputum.  GASTROINTESTINAL: No anorexia, nausea, vomiting or diarrhea. No abdominal pain or blood.  GENITOURINARY: No burning on urination, no polyuria NEUROLOGICAL: No headache, dizziness, syncope, paralysis, ataxia, numbness or tingling in the extremities. No change in bowel or bladder control.  MUSCULOSKELETAL: No muscle, back pain, joint pain or stiffness.  LYMPHATICS: No enlarged nodes. No history of splenectomy.  PSYCHIATRIC: No history of depression or anxiety.  ENDOCRINOLOGIC: No reports of sweating, cold or heat intolerance. No polyuria or polydipsia.  Marland Kitchen   Physical Examination Today's Vitals   08/07/20 1345  BP: 138/70  Pulse: 76  SpO2: 98%  Weight: 122 lb 9.6 oz (55.6 kg)  Height: 5\' 5"  (1.651 m)   Body mass index is 20.4 kg/m.  Gen: resting comfortably, no acute distress HEENT: no scleral icterus, pupils equal round and reactive, no palptable cervical adenopathy,  CV: irreg, no m/r/g, no jvd Resp: Clear to auscultation bilaterally GI: abdomen is soft, non-tender, non-distended, normal bowel sounds, no hepatosplenomegaly MSK: extremities are warm, 1+ bilateral nonpitting edema Skin: warm, no rash Neuro:  no focal deficits Psych: appropriate affect   Diagnostic Studies 09/2014 echo Study Conclusions  - Left ventricle: The cavity size was normal. There was severe asymmetric hypertrophy of the  septum. Systolic function was normal. The estimated ejection fraction was in the range of 60% to 65%. Wall motion was normal; there were no regional wall motion abnormalities. - Aortic valve: There was mild regurgitation. - Mitral valve: There was mild regurgitation directed centrally. - Left atrium: The atrium was moderately to severely dilated. - Tricuspid valve: There was moderate regurgitation. - Pulmonary arteries: Systolic pressure was mildly increased. PA peak pressure: 41 mm Hg (S).  Impressions:  - Appearance is consistent with hypertrophic cardiomyopathy with no outflow tract obstruction (at rest).     Assessment and Plan  1.Chronic diastolic HF/LE edema/Lymphedema -doing well from swelling standpiont, currently off diuretic it appears, not clear which provider stopped it but reasonable to be off, swelling doing ok without diuretic.    2. Afib - overall doing well, continue curren tmeds  3. Permanent pacemaker -normal device check last month, no symptoms, conitnue to monitor.   F/u 6 months     Jalacia Mattila F.  Harl Bowie, M.D

## 2020-08-07 NOTE — Patient Instructions (Signed)
Your physician recommends that you schedule a follow-up appointment in: 6 MONTHS WITH DR BRANCH  Your physician recommends that you continue on your current medications as directed. Please refer to the Current Medication list given to you today.  Thank you for choosing [facility]!!    

## 2020-08-12 DIAGNOSIS — M1380 Other specified arthritis, unspecified site: Secondary | ICD-10-CM | POA: Diagnosis not present

## 2020-08-21 DIAGNOSIS — M545 Low back pain, unspecified: Secondary | ICD-10-CM | POA: Diagnosis not present

## 2020-08-21 DIAGNOSIS — M5416 Radiculopathy, lumbar region: Secondary | ICD-10-CM | POA: Diagnosis not present

## 2020-08-21 DIAGNOSIS — M792 Neuralgia and neuritis, unspecified: Secondary | ICD-10-CM | POA: Diagnosis not present

## 2020-08-21 DIAGNOSIS — M542 Cervicalgia: Secondary | ICD-10-CM | POA: Diagnosis not present

## 2020-08-21 DIAGNOSIS — M5412 Radiculopathy, cervical region: Secondary | ICD-10-CM | POA: Diagnosis not present

## 2020-09-04 DIAGNOSIS — E7800 Pure hypercholesterolemia, unspecified: Secondary | ICD-10-CM | POA: Diagnosis not present

## 2020-09-04 DIAGNOSIS — Z79899 Other long term (current) drug therapy: Secondary | ICD-10-CM | POA: Diagnosis not present

## 2020-09-04 DIAGNOSIS — Z299 Encounter for prophylactic measures, unspecified: Secondary | ICD-10-CM | POA: Diagnosis not present

## 2020-09-04 DIAGNOSIS — Z0000 Encounter for general adult medical examination without abnormal findings: Secondary | ICD-10-CM | POA: Diagnosis not present

## 2020-09-04 DIAGNOSIS — I4891 Unspecified atrial fibrillation: Secondary | ICD-10-CM | POA: Diagnosis not present

## 2020-09-04 DIAGNOSIS — Z7189 Other specified counseling: Secondary | ICD-10-CM | POA: Diagnosis not present

## 2020-09-04 DIAGNOSIS — E1165 Type 2 diabetes mellitus with hyperglycemia: Secondary | ICD-10-CM | POA: Diagnosis not present

## 2020-09-04 DIAGNOSIS — Z6821 Body mass index (BMI) 21.0-21.9, adult: Secondary | ICD-10-CM | POA: Diagnosis not present

## 2020-09-04 DIAGNOSIS — R52 Pain, unspecified: Secondary | ICD-10-CM | POA: Diagnosis not present

## 2020-09-04 DIAGNOSIS — R5383 Other fatigue: Secondary | ICD-10-CM | POA: Diagnosis not present

## 2020-09-05 ENCOUNTER — Telehealth: Payer: Self-pay | Admitting: Cardiology

## 2020-09-08 DIAGNOSIS — K5900 Constipation, unspecified: Secondary | ICD-10-CM | POA: Diagnosis not present

## 2020-09-08 DIAGNOSIS — M5412 Radiculopathy, cervical region: Secondary | ICD-10-CM | POA: Diagnosis not present

## 2020-09-08 DIAGNOSIS — M545 Low back pain, unspecified: Secondary | ICD-10-CM | POA: Diagnosis not present

## 2020-09-08 DIAGNOSIS — M79609 Pain in unspecified limb: Secondary | ICD-10-CM | POA: Diagnosis not present

## 2020-09-08 DIAGNOSIS — R2689 Other abnormalities of gait and mobility: Secondary | ICD-10-CM | POA: Diagnosis not present

## 2020-09-08 DIAGNOSIS — M542 Cervicalgia: Secondary | ICD-10-CM | POA: Diagnosis not present

## 2020-09-11 DIAGNOSIS — M1380 Other specified arthritis, unspecified site: Secondary | ICD-10-CM | POA: Diagnosis not present

## 2020-09-12 ENCOUNTER — Telehealth: Payer: Self-pay

## 2020-09-12 NOTE — Telephone Encounter (Signed)
The patient son Megan Walsh and the patient was on the line as well. They was wanting help sending a transmission with her monitor. She states Dr. Harl Bowie wanted the transmission for a procedure she is having next week. They received the error code 2300. I called tech support to get additional help.  [employer] is sending her a new handheld. She should receive it in 3-5 business days. I will reach out on 09-19-2020 to see if the patient received the new handheld and help send the transmission.

## 2020-09-17 LAB — CUP PACEART REMOTE DEVICE CHECK
Battery Impedance: 7093 Ohm
Battery Remaining Longevity: 6 mo
Battery Voltage: 2.64 V
Brady Statistic RV Percent Paced: 31 %
Date Time Interrogation Session: 20220510210606
Implantable Lead Implant Date: 19980116
Implantable Lead Implant Date: 19980116
Implantable Lead Location: 753859
Implantable Lead Location: 753860
Implantable Lead Model: 5034
Implantable Lead Model: 5534
Implantable Pulse Generator Implant Date: 20100122
Lead Channel Impedance Value: 1382 Ohm
Lead Channel Impedance Value: 67 Ohm
Lead Channel Pacing Threshold Amplitude: 0.625 V
Lead Channel Pacing Threshold Pulse Width: 0.4 ms
Lead Channel Setting Pacing Amplitude: 2.5 V
Lead Channel Setting Pacing Pulse Width: 0.4 ms
Lead Channel Setting Sensing Sensitivity: 5.6 mV

## 2020-09-18 NOTE — Telephone Encounter (Signed)
Estimated battery longevity 6 month. Monthly battery checks are scheduled.  22 VHR event longest 2:37 minutes; EGM suggestive a fib w/ RVR.  Histogram controlled.

## 2020-09-18 NOTE — Telephone Encounter (Signed)
Transmission received 09/16/2020 

## 2020-09-19 NOTE — Telephone Encounter (Signed)
Patient returned call

## 2020-09-19 NOTE — Telephone Encounter (Signed)
Pt denies any palpitations/swelling/SOB/chest pain - says she has an appointment next Friday 5/20 at [facility] and will have them send clearance for procedure (pt was unclear exactly what type of procedure) son was also on call and wasn't clear on what type of procedure and f/u with [place] after appointment with more info

## 2020-09-19 NOTE — Telephone Encounter (Signed)
Known afib, monitor shows some episodes. Has she had any palpitations? I don't recall requesting this transmission, there is a note regarding a procedure she has upcoming and needed a device check. Can we get more info on this  J Megen Madewell MD

## 2020-10-06 DIAGNOSIS — I10 Essential (primary) hypertension: Secondary | ICD-10-CM | POA: Diagnosis not present

## 2020-10-06 DIAGNOSIS — E7800 Pure hypercholesterolemia, unspecified: Secondary | ICD-10-CM | POA: Diagnosis not present

## 2020-10-10 DIAGNOSIS — M5011 Cervical disc disorder with radiculopathy,  high cervical region: Secondary | ICD-10-CM | POA: Diagnosis not present

## 2020-10-10 DIAGNOSIS — M5117 Intervertebral disc disorders with radiculopathy, lumbosacral region: Secondary | ICD-10-CM | POA: Diagnosis not present

## 2020-10-10 DIAGNOSIS — M4802 Spinal stenosis, cervical region: Secondary | ICD-10-CM | POA: Diagnosis not present

## 2020-10-10 DIAGNOSIS — M5116 Intervertebral disc disorders with radiculopathy, lumbar region: Secondary | ICD-10-CM | POA: Diagnosis not present

## 2020-10-10 DIAGNOSIS — M4319 Spondylolisthesis, multiple sites in spine: Secondary | ICD-10-CM | POA: Diagnosis not present

## 2020-10-10 DIAGNOSIS — M419 Scoliosis, unspecified: Secondary | ICD-10-CM | POA: Diagnosis not present

## 2020-10-10 DIAGNOSIS — M5416 Radiculopathy, lumbar region: Secondary | ICD-10-CM | POA: Diagnosis not present

## 2020-10-10 DIAGNOSIS — M4807 Spinal stenosis, lumbosacral region: Secondary | ICD-10-CM | POA: Diagnosis not present

## 2020-10-10 DIAGNOSIS — M4312 Spondylolisthesis, cervical region: Secondary | ICD-10-CM | POA: Diagnosis not present

## 2020-10-10 DIAGNOSIS — M48061 Spinal stenosis, lumbar region without neurogenic claudication: Secondary | ICD-10-CM | POA: Diagnosis not present

## 2020-10-10 DIAGNOSIS — M47816 Spondylosis without myelopathy or radiculopathy, lumbar region: Secondary | ICD-10-CM | POA: Diagnosis not present

## 2020-10-10 DIAGNOSIS — M47817 Spondylosis without myelopathy or radiculopathy, lumbosacral region: Secondary | ICD-10-CM | POA: Diagnosis not present

## 2020-10-10 DIAGNOSIS — M4803 Spinal stenosis, cervicothoracic region: Secondary | ICD-10-CM | POA: Diagnosis not present

## 2020-10-10 DIAGNOSIS — M5115 Intervertebral disc disorders with radiculopathy, thoracolumbar region: Secondary | ICD-10-CM | POA: Diagnosis not present

## 2020-10-10 DIAGNOSIS — M4722 Other spondylosis with radiculopathy, cervical region: Secondary | ICD-10-CM | POA: Diagnosis not present

## 2020-10-10 DIAGNOSIS — M4726 Other spondylosis with radiculopathy, lumbar region: Secondary | ICD-10-CM | POA: Diagnosis not present

## 2020-10-12 DIAGNOSIS — M1380 Other specified arthritis, unspecified site: Secondary | ICD-10-CM | POA: Diagnosis not present

## 2020-10-16 ENCOUNTER — Ambulatory Visit (INDEPENDENT_AMBULATORY_CARE_PROVIDER_SITE_OTHER): Payer: Medicare Other

## 2020-10-16 DIAGNOSIS — I495 Sick sinus syndrome: Secondary | ICD-10-CM

## 2020-10-17 LAB — CUP PACEART REMOTE DEVICE CHECK
Battery Impedance: 7944 Ohm
Battery Remaining Longevity: 3 mo
Battery Voltage: 2.63 V
Brady Statistic RV Percent Paced: 32 %
Date Time Interrogation Session: 20220610124607
Implantable Lead Implant Date: 19980116
Implantable Lead Implant Date: 19980116
Implantable Lead Location: 753859
Implantable Lead Location: 753860
Implantable Lead Model: 5034
Implantable Lead Model: 5534
Implantable Pulse Generator Implant Date: 20100122
Lead Channel Impedance Value: 1286 Ohm
Lead Channel Impedance Value: 67 Ohm
Lead Channel Pacing Threshold Amplitude: 0.625 V
Lead Channel Pacing Threshold Pulse Width: 0.4 ms
Lead Channel Setting Pacing Amplitude: 2.5 V
Lead Channel Setting Pacing Pulse Width: 0.4 ms
Lead Channel Setting Sensing Sensitivity: 5.6 mV

## 2020-10-20 ENCOUNTER — Telehealth: Payer: Self-pay | Admitting: Emergency Medicine

## 2020-10-20 NOTE — Telephone Encounter (Signed)
LMOM per DPR to call device clinic with #. Alert for episodes of AF with RVR . Assess patient.+ Eliquis. F/U with Dr Rayann Heman on 12/12/20 in [place].

## 2020-10-21 DIAGNOSIS — L84 Corns and callosities: Secondary | ICD-10-CM | POA: Diagnosis not present

## 2020-10-21 DIAGNOSIS — E1142 Type 2 diabetes mellitus with diabetic polyneuropathy: Secondary | ICD-10-CM | POA: Diagnosis not present

## 2020-10-21 DIAGNOSIS — M5412 Radiculopathy, cervical region: Secondary | ICD-10-CM | POA: Diagnosis not present

## 2020-10-21 DIAGNOSIS — B351 Tinea unguium: Secondary | ICD-10-CM | POA: Diagnosis not present

## 2020-10-21 DIAGNOSIS — M79676 Pain in unspecified toe(s): Secondary | ICD-10-CM | POA: Diagnosis not present

## 2020-10-29 DIAGNOSIS — M5031 Other cervical disc degeneration,  high cervical region: Secondary | ICD-10-CM | POA: Diagnosis not present

## 2020-11-06 NOTE — Telephone Encounter (Signed)
2nd attempt to contact patient. No answer, LMTCB. 

## 2020-11-07 ENCOUNTER — Encounter: Payer: Self-pay | Admitting: Internal Medicine

## 2020-11-07 ENCOUNTER — Ambulatory Visit (INDEPENDENT_AMBULATORY_CARE_PROVIDER_SITE_OTHER): Payer: Medicare Other | Admitting: Internal Medicine

## 2020-11-07 VITALS — BP 130/72 | HR 82 | Ht 65.0 in | Wt 131.6 lb

## 2020-11-07 DIAGNOSIS — I495 Sick sinus syndrome: Secondary | ICD-10-CM | POA: Diagnosis not present

## 2020-11-07 DIAGNOSIS — I4821 Permanent atrial fibrillation: Secondary | ICD-10-CM | POA: Diagnosis not present

## 2020-11-07 DIAGNOSIS — D6869 Other thrombophilia: Secondary | ICD-10-CM | POA: Diagnosis not present

## 2020-11-07 DIAGNOSIS — Z950 Presence of cardiac pacemaker: Secondary | ICD-10-CM

## 2020-11-07 NOTE — Patient Instructions (Addendum)
Medication Instructions:  Continue all current medications.  Labwork: none  Testing/Procedures: none  Follow-Up: Continue monthly remotes  To be determined   Any Other Special Instructions Will Be Listed Below (If Applicable).  If you need a refill on your cardiac medications before your next appointment, please call your pharmacy.

## 2020-11-07 NOTE — Progress Notes (Signed)
Remote pacemaker transmission.   

## 2020-11-07 NOTE — Addendum Note (Signed)
Addended by: Cheri Kearns A on: 11/07/2020 02:35 PM   Modules accepted: Level of Service

## 2020-11-07 NOTE — Progress Notes (Signed)
PCP: Monico Blitz, MD Primary Cardiologist: Dr Harl Bowie Primary EP:  Dr [name] is a 87 y.o. female who presents today for routine electrophysiology followup.  Since last being seen in our clinic, the patient reports doing reasonably well.  Her primary concern is with unsteadiness.  She has had prior falls.  Today, she denies symptoms of palpitations, chest pain, shortness of breath,  lower extremity edema, or syncope.  The patient is otherwise without complaint today.   Past Medical History:  Diagnosis Date   Anxiety    Arthritis    CHF (congestive heart failure) ([place])    Coronary artery disease    Diabetes mellitus without complication ([place])    Dysrhythmia    a-fib   Fatty tumor    History of gout    Hypertension    Myocardial infarction [facility])    Permanent atrial fibrillation ([place])    Presence of permanent cardiac pacemaker    MDT ADDR01/Implanted: 05/31/08   Sleep apnea    cannot tolerate CPAP   Symptomatic bradycardia    Past Surgical History:  Procedure Laterality Date   ABDOMINAL HYSTERECTOMY     BROW LIFT Bilateral 03/10/2018   Procedure: BLEPHAROPLASTY BILATERAL UPPER EYELIDS;  Surgeon: Baruch Goldmann, MD;  Location: [facility] ORS;  Service: Ophthalmology;  Laterality: Bilateral;   CATARACT EXTRACTION W/PHACO Left 10/17/2017   Procedure: CATARACT EXTRACTION PHACO AND INTRAOCULAR LENS PLACEMENT LEFT EYE;  Surgeon: Tonny Branch, MD;  Location: [facility] ORS;  Service: Ophthalmology;  Laterality: Left;  CDE: 11.64   CATARACT EXTRACTION W/PHACO Right 10/31/2017   Procedure: CATARACT EXTRACTION PHACO AND INTRAOCULAR LENS PLACEMENT RIGHT EYE;  Surgeon: Tonny Branch, MD;  Location: [facility] ORS;  Service: Ophthalmology;  Laterality: Right;  CDE: 11.19   fatty tumor removal to left arm Right    PACEMAKER GENERATOR CHANGE  05/31/08   at [facility]: MDT ADDR01/Implanted: 05/31/08 as gen change by Dr Reinaldo Berber   PACEMAKER INSERTION  1992    ROS- all systems are reviewed and negative except  as per HPI above  Current Outpatient Medications  Medication Sig Dispense Refill   acetaminophen (TYLENOL) 650 MG CR tablet Take 650 mg by mouth 2 (two) times daily as needed for pain.      allopurinol (ZYLOPRIM) 300 MG tablet Take 300 mg by mouth daily.     ascorbic acid (VITAMIN C) 500 MG tablet Take 500 mg by mouth daily.     DULoxetine (CYMBALTA) 30 MG capsule Take 30 mg by mouth at bedtime.     ELIQUIS 2.5 MG TABS tablet TAKE 1 TABLET BY MOUTH TWICE DAILY 60 tablet 6   HYDROcodone-acetaminophen (NORCO/VICODIN) 5-325 MG tablet Take 1 tablet by mouth daily as needed.     potassium chloride SA (K-DUR,KLOR-CON) 20 MEQ tablet Take 20 mEq by mouth daily.     simvastatin (ZOCOR) 40 MG tablet Take 40 mg by mouth daily.     No current facility-administered medications for this visit.    Physical Exam: Vitals:   11/07/20 1046  BP: 130/72  Pulse: 82  SpO2: 98%  Weight: 131 lb 9.6 oz (59.7 kg)  Height: 5\' 5"  (1.651 m)    GEN- The patient is well appearing, alert and oriented x 3 today.   Head- normocephalic, atraumatic Eyes-  Sclera clear, conjunctiva pink Ears- hearing intact Oropharynx- clear Lungs- Clear to ausculation bilaterally, normal work of breathing Chest- pacemaker pocket is well healed Heart- IRRR GI- soft, NT, ND, + BS Extremities- no clubbing,  cyanosis, or edema  Pacemaker interrogation- reviewed in detail today,  See PACEART report  Echo from primary care 2019 reviewed.  EF 55%    Assessment and Plan:  1. Symptomatic bradycardia due to second degree heart block Normal pacemaker function See Pace Art report No changes today she is not device dependant today Approaching ERI Risks, benefits, and alternatives to PPM pulse generator replacement were discussed in detail today.  The patient understands that risks include but are not limited to bleeding, infection, pneumothorax, perforation, tamponade, vascular damage, renal failure, MI, stroke, death, damage to his  existing leads, and lead dislodgement and wishes to proceed once ERI.   We will continue monthly remotes.  Once ERI, ok to schedule generator change without repeat office visit required. Hold eliquis 24 hours prior to the procedure.  2. Permanent afib Rate controlled Chads2vasc score is 7.    Hold eliquis 24 hours prior to PPM generator change  3. HL Continue simvastatin 40mg  daily  4. Chronic diastolic dysfunction Sodium restriction advised No changes today  Continue monthly remotes  Thompson Grayer MD, [facility] 11/07/2020 11:07 AM

## 2020-11-11 DIAGNOSIS — M1380 Other specified arthritis, unspecified site: Secondary | ICD-10-CM | POA: Diagnosis not present

## 2020-11-13 NOTE — Telephone Encounter (Signed)
Attempted to reach patient, no answer, unidentified VM.  Tried calling pt son Natale Milch [facility] on record.  Left message requesting he have patient call us back.  After message left, noted that patient had a visit with Dr. Rayann Heman on 7/1, therefore return phone call is no longer needed.  Left message for son advising to disregard.

## 2020-11-14 DIAGNOSIS — M542 Cervicalgia: Secondary | ICD-10-CM | POA: Diagnosis not present

## 2020-11-14 DIAGNOSIS — M5412 Radiculopathy, cervical region: Secondary | ICD-10-CM | POA: Diagnosis not present

## 2020-11-14 DIAGNOSIS — M545 Low back pain, unspecified: Secondary | ICD-10-CM | POA: Diagnosis not present

## 2020-11-18 ENCOUNTER — Ambulatory Visit (INDEPENDENT_AMBULATORY_CARE_PROVIDER_SITE_OTHER): Payer: Medicare Other

## 2020-11-18 DIAGNOSIS — I495 Sick sinus syndrome: Secondary | ICD-10-CM

## 2020-11-20 ENCOUNTER — Telehealth: Payer: Self-pay

## 2020-11-20 LAB — CUP PACEART REMOTE DEVICE CHECK
Battery Impedance: 10099 Ohm
Battery Voltage: 2.61 V
Brady Statistic RV Percent Paced: 65 %
Date Time Interrogation Session: 20220713140545
Implantable Lead Implant Date: 19980116
Implantable Lead Implant Date: 19980116
Implantable Lead Location: 753859
Implantable Lead Location: 753860
Implantable Lead Model: 5034
Implantable Lead Model: 5534
Implantable Pulse Generator Implant Date: 20100122
Lead Channel Impedance Value: 1317 Ohm
Lead Channel Impedance Value: 67 Ohm
Lead Channel Setting Pacing Amplitude: 2.5 V
Lead Channel Setting Pacing Pulse Width: 0.4 ms
Lead Channel Setting Sensing Sensitivity: 5.6 mV

## 2020-11-20 NOTE — Telephone Encounter (Signed)
Carelink remote received and battery has reached RRT 11/08/20. Patient called and made aware I will forward to Dr. Bonita Quin nurse and she will call with a time to set up gen change. Patient verbalized understanding.

## 2020-11-22 NOTE — Telephone Encounter (Signed)
Ok to schedule generator change.  Not urgent as she is not dependant.  Does not require an additional office visit prior. Hold eliquis 24 hours prior per my note.

## 2020-11-25 ENCOUNTER — Encounter: Payer: Self-pay | Admitting: *Deleted

## 2020-11-25 NOTE — Telephone Encounter (Signed)
Called patient and assisted to pick procedure day, August 30. Will get labs and give instructions at office visit already scheduled in [place].

## 2020-12-07 DIAGNOSIS — I10 Essential (primary) hypertension: Secondary | ICD-10-CM | POA: Diagnosis not present

## 2020-12-07 DIAGNOSIS — E7800 Pure hypercholesterolemia, unspecified: Secondary | ICD-10-CM | POA: Diagnosis not present

## 2020-12-09 ENCOUNTER — Encounter: Payer: Self-pay | Admitting: Cardiology

## 2020-12-09 DIAGNOSIS — Z6821 Body mass index (BMI) 21.0-21.9, adult: Secondary | ICD-10-CM | POA: Diagnosis not present

## 2020-12-09 DIAGNOSIS — I4891 Unspecified atrial fibrillation: Secondary | ICD-10-CM | POA: Diagnosis not present

## 2020-12-09 DIAGNOSIS — E1165 Type 2 diabetes mellitus with hyperglycemia: Secondary | ICD-10-CM | POA: Diagnosis not present

## 2020-12-09 DIAGNOSIS — I10 Essential (primary) hypertension: Secondary | ICD-10-CM | POA: Diagnosis not present

## 2020-12-09 DIAGNOSIS — M549 Dorsalgia, unspecified: Secondary | ICD-10-CM | POA: Diagnosis not present

## 2020-12-09 DIAGNOSIS — Z299 Encounter for prophylactic measures, unspecified: Secondary | ICD-10-CM | POA: Diagnosis not present

## 2020-12-09 DIAGNOSIS — B0229 Other postherpetic nervous system involvement: Secondary | ICD-10-CM | POA: Diagnosis not present

## 2020-12-11 NOTE — Addendum Note (Signed)
Addended by: Douglass Rivers D on: 12/11/2020 01:20 PM   Modules accepted: Level of Service

## 2020-12-11 NOTE — Progress Notes (Signed)
Remote pacemaker transmission.   

## 2020-12-12 ENCOUNTER — Ambulatory Visit (INDEPENDENT_AMBULATORY_CARE_PROVIDER_SITE_OTHER): Payer: Medicare Other | Admitting: Internal Medicine

## 2020-12-12 ENCOUNTER — Encounter: Payer: Medicare HMO | Admitting: Internal Medicine

## 2020-12-12 ENCOUNTER — Other Ambulatory Visit: Payer: Self-pay

## 2020-12-12 VITALS — BP 124/70 | HR 0 | Ht 65.0 in | Wt 127.8 lb

## 2020-12-12 DIAGNOSIS — I4821 Permanent atrial fibrillation: Secondary | ICD-10-CM

## 2020-12-12 DIAGNOSIS — Z01818 Encounter for other preprocedural examination: Secondary | ICD-10-CM

## 2020-12-12 DIAGNOSIS — I10 Essential (primary) hypertension: Secondary | ICD-10-CM

## 2020-12-12 DIAGNOSIS — I495 Sick sinus syndrome: Secondary | ICD-10-CM

## 2020-12-12 DIAGNOSIS — E782 Mixed hyperlipidemia: Secondary | ICD-10-CM

## 2020-12-12 DIAGNOSIS — M1380 Other specified arthritis, unspecified site: Secondary | ICD-10-CM | POA: Diagnosis not present

## 2020-12-12 DIAGNOSIS — Z01812 Encounter for preprocedural laboratory examination: Secondary | ICD-10-CM

## 2020-12-12 NOTE — H&P (View-Only) (Signed)
PCP: Monico Blitz, MD Primary Cardiologist: Dr Harl Bowie Primary EP:  Dr Megan Walsh is a 87 y.o. female who presents today for routine electrophysiology followup.  Since last being seen in our clinic, the patient reports doing very well.  Her pacemaker has reached ERI.  She is planned for ppm generator change and presents today to discuss this further. Today, she denies symptoms of palpitations, chest pain, shortness of breath,  lower extremity edema, dizziness, presyncope, or syncope.  The patient is otherwise without complaint today.   Past Medical History:  Diagnosis Date   Anxiety    Arthritis    CHF (congestive heart failure) ([place])    Coronary artery disease    Diabetes mellitus without complication ([place])    Dysrhythmia    a-fib   Fatty tumor    History of gout    Hypertension    Myocardial infarction [facility])    Permanent atrial fibrillation ([place])    Presence of permanent cardiac pacemaker    MDT ADDR01/Implanted: 05/31/08   Sleep apnea    cannot tolerate CPAP   Symptomatic bradycardia    Past Surgical History:  Procedure Laterality Date   ABDOMINAL HYSTERECTOMY     BROW LIFT Bilateral 03/10/2018   Procedure: BLEPHAROPLASTY BILATERAL UPPER EYELIDS;  Surgeon: Baruch Goldmann, MD;  Location: [facility];  Service: Ophthalmology;  Laterality: Bilateral;   CATARACT EXTRACTION W/PHACO Left 10/17/2017   Procedure: CATARACT EXTRACTION PHACO AND INTRAOCULAR LENS PLACEMENT LEFT EYE;  Surgeon: Tonny Branch, MD;  Location: [facility];  Service: Ophthalmology;  Laterality: Left;  CDE: 11.64   CATARACT EXTRACTION W/PHACO Right 10/31/2017   Procedure: CATARACT EXTRACTION PHACO AND INTRAOCULAR LENS PLACEMENT RIGHT EYE;  Surgeon: Tonny Branch, MD;  Location: [facility];  Service: Ophthalmology;  Laterality: Right;  CDE: 11.19   fatty tumor removal to left arm Right    PACEMAKER GENERATOR CHANGE  05/31/08   at [facility]: MDT ADDR01/Implanted: 05/31/08 as gen change by Dr Reinaldo Berber   PACEMAKER INSERTION   1992    ROS- all systems are reviewed and negative except as per HPI above  Current Outpatient Medications  Medication Sig Dispense Refill   acetaminophen (TYLENOL) 650 MG CR tablet Take 650 mg by mouth 2 (two) times daily as needed for pain.      allopurinol (ZYLOPRIM) 300 MG tablet Take 300 mg by mouth daily.     ascorbic acid (VITAMIN C) 500 MG tablet Take 500 mg by mouth daily.     DULoxetine (CYMBALTA) 30 MG capsule Take 30 mg by mouth at bedtime.     ELIQUIS 2.5 MG TABS tablet TAKE 1 TABLET BY MOUTH TWICE DAILY 60 tablet 6   HYDROcodone-acetaminophen (NORCO/VICODIN) 5-325 MG tablet Take 1 tablet by mouth daily as needed.     potassium chloride SA (K-DUR,KLOR-CON) 20 MEQ tablet Take 20 mEq by mouth daily.     simvastatin (ZOCOR) 40 MG tablet Take 40 mg by mouth daily.     No current facility-administered medications for this visit.    Physical Exam: Vitals:   12/12/20 1218  BP: 124/70  Pulse: (!) 0  SpO2: 97%  Weight: 127 lb 12.8 oz (58 kg)  Height: '5\' 5"'$  (1.651 m)    GEN- The patient is well appearing, alert and oriented x 3 today.   Head- normocephalic, atraumatic Eyes-  Sclera clear, conjunctiva pink Ears- hearing intact Oropharynx- clear Lungs- Clear to ausculation bilaterally, normal work of breathing Chest- pacemaker pocket is well healed Heart-  irregular rate and rhythm,  GI- soft, NT, ND, + BS Extremities- no clubbing, cyanosis, or edema  Pacemaker interrogation- reviewed in detail today,  See PACEART report  ekg tracing ordered today is personally reviewed and shows afib  Assessment and Plan:  1. Symptomatic second degree heart block Normal pacemaker function  She has reached ERI See Pace Art report No changes today she is not device dependant today Risks, benefits, and alternatives to PPM  pulse generator replacement were discussed in detail today.  The patient understands that risks include but are not limited to bleeding, infection, pneumothorax,  perforation, tamponade, vascular damage, renal failure, MI, stroke, death,  damage to his existing leads, and lead dislodgement and wishes to proceed.  We will therefore schedule the procedure at the next available time.  Hold eliquis 24 hours prior to PPM generator change   2. Permanent afib Chads2vasc score is 7.  She is on eliquis  3. HL Continue simvastatin '40mg'$  daily  4. Chronic diastolic dysfunction Sodium restriction is advised    Thompson Grayer MD, [facility] 12/12/2020 12:25 PM

## 2020-12-12 NOTE — Progress Notes (Signed)
PCP: Monico Blitz, MD Primary Cardiologist: Dr Harl Bowie Primary EP:  Dr [name] is a 87 y.o. female who presents today for routine electrophysiology followup.  Since last being seen in our clinic, the patient reports doing very well.  Her pacemaker has reached ERI.  She is planned for ppm generator change and presents today to discuss this further. Today, she denies symptoms of palpitations, chest pain, shortness of breath,  lower extremity edema, dizziness, presyncope, or syncope.  The patient is otherwise without complaint today.   Past Medical History:  Diagnosis Date   Anxiety    Arthritis    CHF (congestive heart failure) ([place])    Coronary artery disease    Diabetes mellitus without complication ([place])    Dysrhythmia    a-fib   Fatty tumor    History of gout    Hypertension    Myocardial infarction [facility])    Permanent atrial fibrillation ([place])    Presence of permanent cardiac pacemaker    MDT ADDR01/Implanted: 05/31/08   Sleep apnea    cannot tolerate CPAP   Symptomatic bradycardia    Past Surgical History:  Procedure Laterality Date   ABDOMINAL HYSTERECTOMY     BROW LIFT Bilateral 03/10/2018   Procedure: BLEPHAROPLASTY BILATERAL UPPER EYELIDS;  Surgeon: Baruch Goldmann, MD;  Location: [facility];  Service: Ophthalmology;  Laterality: Bilateral;   CATARACT EXTRACTION W/PHACO Left 10/17/2017   Procedure: CATARACT EXTRACTION PHACO AND INTRAOCULAR LENS PLACEMENT LEFT EYE;  Surgeon: Tonny Branch, MD;  Location: [facility];  Service: Ophthalmology;  Laterality: Left;  CDE: 11.64   CATARACT EXTRACTION W/PHACO Right 10/31/2017   Procedure: CATARACT EXTRACTION PHACO AND INTRAOCULAR LENS PLACEMENT RIGHT EYE;  Surgeon: Tonny Branch, MD;  Location: [facility];  Service: Ophthalmology;  Laterality: Right;  CDE: 11.19   fatty tumor removal to left arm Right    PACEMAKER GENERATOR CHANGE  05/31/08   at [facility]: MDT ADDR01/Implanted: 05/31/08 as gen change by Dr Reinaldo Berber   PACEMAKER INSERTION   1992    ROS- all systems are reviewed and negative except as per HPI above  Current Outpatient Medications  Medication Sig Dispense Refill   acetaminophen (TYLENOL) 650 MG CR tablet Take 650 mg by mouth 2 (two) times daily as needed for pain.      allopurinol (ZYLOPRIM) 300 MG tablet Take 300 mg by mouth daily.     ascorbic acid (VITAMIN C) 500 MG tablet Take 500 mg by mouth daily.     DULoxetine (CYMBALTA) 30 MG capsule Take 30 mg by mouth at bedtime.     ELIQUIS 2.5 MG TABS tablet TAKE 1 TABLET BY MOUTH TWICE DAILY 60 tablet 6   HYDROcodone-acetaminophen (NORCO/VICODIN) 5-325 MG tablet Take 1 tablet by mouth daily as needed.     potassium chloride SA (K-DUR,KLOR-CON) 20 MEQ tablet Take 20 mEq by mouth daily.     simvastatin (ZOCOR) 40 MG tablet Take 40 mg by mouth daily.     No current facility-administered medications for this visit.    Physical Exam: Vitals:   12/12/20 1218  BP: 124/70  Pulse: (!) 0  SpO2: 97%  Weight: 127 lb 12.8 oz (58 kg)  Height: '5\' 5"'$  (1.651 m)    GEN- The patient is well appearing, alert and oriented x 3 today.   Head- normocephalic, atraumatic Eyes-  Sclera clear, conjunctiva pink Ears- hearing intact Oropharynx- clear Lungs- Clear to ausculation bilaterally, normal work of breathing Chest- pacemaker pocket is well healed Heart-  irregular rate and rhythm,  GI- soft, NT, ND, + BS Extremities- no clubbing, cyanosis, or edema  Pacemaker interrogation- reviewed in detail today,  See PACEART report  ekg tracing ordered today is personally reviewed and shows afib  Assessment and Plan:  1. Symptomatic second degree heart block Normal pacemaker function  She has reached ERI See Pace Art report No changes today she is not device dependant today Risks, benefits, and alternatives to PPM  pulse generator replacement were discussed in detail today.  The patient understands that risks include but are not limited to bleeding, infection, pneumothorax,  perforation, tamponade, vascular damage, renal failure, MI, stroke, death,  damage to his existing leads, and lead dislodgement and wishes to proceed.  We will therefore schedule the procedure at the next available time.  Hold eliquis 24 hours prior to PPM generator change   2. Permanent afib Chads2vasc score is 7.  She is on eliquis  3. HL Continue simvastatin '40mg'$  daily  4. Chronic diastolic dysfunction Sodium restriction is advised    Thompson Grayer MD, [facility] 12/12/2020 12:25 PM

## 2020-12-12 NOTE — Patient Instructions (Signed)
Medication Instructions:  Continue all current medications.  Labwork: BMET, CBC - orders given today.  Testing/Procedures: Pacemaker generator change - see instructions given  Follow-Up: Pending   Any Other Special Instructions Will Be Listed Below (If Applicable).  If you need a refill on your cardiac medications before your next appointment, please call your pharmacy.

## 2020-12-16 ENCOUNTER — Other Ambulatory Visit (HOSPITAL_COMMUNITY)
Admission: RE | Admit: 2020-12-16 | Discharge: 2020-12-16 | Disposition: A | Discharge status: Home / Self Care | Payer: Medicare Other | Source: Ambulatory Visit | Attending: Internal Medicine | Admitting: Internal Medicine

## 2020-12-16 ENCOUNTER — Other Ambulatory Visit: Payer: Self-pay

## 2020-12-16 DIAGNOSIS — I495 Sick sinus syndrome: Secondary | ICD-10-CM | POA: Diagnosis not present

## 2020-12-16 DIAGNOSIS — Z01812 Encounter for preprocedural laboratory examination: Secondary | ICD-10-CM | POA: Diagnosis not present

## 2020-12-16 DIAGNOSIS — I4821 Permanent atrial fibrillation: Secondary | ICD-10-CM | POA: Diagnosis not present

## 2020-12-16 DIAGNOSIS — I10 Essential (primary) hypertension: Secondary | ICD-10-CM

## 2020-12-16 LAB — CBC
HCT: 41.1 % (ref 36.0–46.0)
Hemoglobin: 12.8 g/dL (ref 12.0–15.0)
MCH: 29 pg (ref 26.0–34.0)
MCHC: 31.1 g/dL (ref 30.0–36.0)
MCV: 93.2 fL (ref 80.0–100.0)
Platelets: 167 10*3/uL (ref 150–400)
RBC: 4.41 MIL/uL (ref 3.87–5.11)
RDW: 12.9 % (ref 11.5–15.5)
WBC: 2.7 10*3/uL — ABNORMAL LOW (ref 4.0–10.5)
nRBC: 0 % (ref 0.0–0.2)

## 2020-12-16 LAB — BASIC METABOLIC PANEL
Anion gap: 9 (ref 5–15)
BUN: 19 mg/dL (ref 8–23)
CO2: 23 mmol/L (ref 22–32)
Calcium: 9.4 mg/dL (ref 8.9–10.3)
Chloride: 107 mmol/L (ref 98–111)
Creatinine, Ser: 0.92 mg/dL (ref 0.44–1.00)
GFR, Estimated: >60 mL/min (ref >60)
Glucose, Bld: 122 mg/dL — ABNORMAL HIGH (ref 70–99)
Potassium: 4.8 mmol/L (ref 3.5–5.1)
Sodium: 139 mmol/L (ref 135–145)

## 2021-01-05 NOTE — Pre-Procedure Instructions (Signed)
Instructed patient on the following items: Arrival time 1130 Nothing to eat or drink after midnight No meds AM of procedure Responsible person to drive you home and stay with you for 24 hrs Wash with special soap night before and morning of procedure If on anti-coagulant drug instructions Eliquis- last dose this am

## 2021-01-06 ENCOUNTER — Ambulatory Visit (HOSPITAL_COMMUNITY)
Admission: RE | Admit: 2021-01-06 | Discharge: 2021-01-06 | Disposition: A | Discharge status: Home / Self Care | Payer: Medicare Other | Attending: Internal Medicine | Admitting: Internal Medicine

## 2021-01-06 ENCOUNTER — Encounter (HOSPITAL_COMMUNITY): Payer: Self-pay | Admitting: Internal Medicine

## 2021-01-06 ENCOUNTER — Ambulatory Visit (HOSPITAL_COMMUNITY)
Admission: RE | Disposition: A | Discharge status: Home / Self Care | Payer: Medicare Other | Source: Home / Self Care | Attending: Internal Medicine

## 2021-01-06 DIAGNOSIS — I4819 Other persistent atrial fibrillation: Secondary | ICD-10-CM | POA: Diagnosis not present

## 2021-01-06 DIAGNOSIS — E785 Hyperlipidemia, unspecified: Secondary | ICD-10-CM | POA: Insufficient documentation

## 2021-01-06 DIAGNOSIS — I441 Atrioventricular block, second degree: Secondary | ICD-10-CM | POA: Insufficient documentation

## 2021-01-06 DIAGNOSIS — Z79899 Other long term (current) drug therapy: Secondary | ICD-10-CM | POA: Insufficient documentation

## 2021-01-06 DIAGNOSIS — Z45010 Encounter for checking and testing of cardiac pacemaker pulse generator [battery]: Secondary | ICD-10-CM | POA: Diagnosis not present

## 2021-01-06 DIAGNOSIS — Z7901 Long term (current) use of anticoagulants: Secondary | ICD-10-CM | POA: Insufficient documentation

## 2021-01-06 DIAGNOSIS — I519 Heart disease, unspecified: Secondary | ICD-10-CM | POA: Diagnosis not present

## 2021-01-06 HISTORY — PX: PPM GENERATOR CHANGEOUT: EP1233

## 2021-01-06 LAB — GLUCOSE, CAPILLARY: Glucose-Capillary: 99 mg/dL (ref 70–99)

## 2021-01-06 SURGERY — PPM GENERATOR CHANGEOUT

## 2021-01-06 MED ORDER — ACETAMINOPHEN 325 MG PO TABS: 325.0000 mg | ORAL_TABLET | ORAL | Status: DC | PRN

## 2021-01-06 MED ORDER — CEFAZOLIN SODIUM-DEXTROSE 2-4 GM/100ML-% IV SOLN
2.0000 g | INTRAVENOUS | Status: AC
  Administered 2021-01-06: 2 g via INTRAVENOUS

## 2021-01-06 MED ORDER — HYDRALAZINE HCL 20 MG/ML IJ SOLN
INTRAMUSCULAR | Status: AC
  Filled 2021-01-06: qty 1

## 2021-01-06 MED ORDER — LIDOCAINE HCL (PF) 1 % IJ SOLN
INTRAMUSCULAR | Status: AC
  Filled 2021-01-06: qty 60

## 2021-01-06 MED ORDER — SODIUM CHLORIDE 0.9 % IV SOLN
INTRAVENOUS | Status: AC
  Filled 2021-01-06: qty 2

## 2021-01-06 MED ORDER — ONDANSETRON HCL 4 MG/2ML IJ SOLN: 4.0000 mg | Freq: Four times a day (QID) | INTRAMUSCULAR | Status: DC | PRN

## 2021-01-06 MED ORDER — SODIUM CHLORIDE 0.9 % IV SOLN: 250.0000 mL | INTRAVENOUS | Status: DC | PRN

## 2021-01-06 MED ORDER — SODIUM CHLORIDE 0.9 % IV SOLN: INTRAVENOUS | Status: DC

## 2021-01-06 MED ORDER — SODIUM CHLORIDE 0.9% FLUSH: 3.0000 mL | Freq: Two times a day (BID) | INTRAVENOUS | Status: DC

## 2021-01-06 MED ORDER — HYDRALAZINE HCL 20 MG/ML IJ SOLN
INTRAMUSCULAR | Status: DC | PRN
Start: 2021-01-06 — End: 2021-01-06
  Administered 2021-01-06: 10 mg via INTRAVENOUS

## 2021-01-06 MED ORDER — SODIUM CHLORIDE 0.9% FLUSH: 3.0000 mL | INTRAVENOUS | Status: DC | PRN

## 2021-01-06 MED ORDER — CEFAZOLIN SODIUM-DEXTROSE 2-4 GM/100ML-% IV SOLN
INTRAVENOUS | Status: AC
  Filled 2021-01-06: qty 100

## 2021-01-06 MED ORDER — SODIUM CHLORIDE 0.9 % IV SOLN
80.0000 mg | INTRAVENOUS | Status: AC
  Administered 2021-01-06: 80 mg

## 2021-01-06 MED ORDER — CHLORHEXIDINE GLUCONATE 4 % EX LIQD: 4.0000 "application " | Freq: Once | CUTANEOUS | Status: DC

## 2021-01-06 SURGICAL SUPPLY — 5 items
CABLE SURGICAL S-101-97-12 (CABLE) ×2 IMPLANT
IPG PACE AZUR XT DR MRI W1DR01 (Pacemaker) ×1 IMPLANT
PACE AZURE XT DR MRI W1DR01 (Pacemaker) ×2 IMPLANT
PAD PRO RADIOLUCENT 2001M-C (PAD) ×2 IMPLANT
TRAY PACEMAKER INSERTION (PACKS) ×2 IMPLANT

## 2021-01-06 NOTE — Interval H&P Note (Signed)
History and Physical Interval Note:  01/06/2021 12:45 PM  Megan Walsh  has presented today for surgery, with the diagnosis of eri.  The various methods of treatment have been discussed with the patient and family. After consideration of risks, benefits and other options for treatment, the patient has consented to  Procedure(s): PPM GENERATOR CHANGEOUT (N/A) as a surgical intervention.  The patient's history has been reviewed, patient examined, no change in status, stable for surgery.  I have reviewed the patient's chart and labs.  Questions were answered to the patient's satisfaction.     Thompson Grayer

## 2021-01-06 NOTE — Discharge Instructions (Addendum)
Resume eliquis on Thursday Sept 1, 2022 with evening dose.

## 2021-01-07 MED FILL — Lidocaine HCl Local Preservative Free (PF) Inj 1%: INTRAMUSCULAR | Qty: 60 | Status: AC

## 2021-01-12 DIAGNOSIS — M1380 Other specified arthritis, unspecified site: Secondary | ICD-10-CM | POA: Diagnosis not present

## 2021-01-21 ENCOUNTER — Other Ambulatory Visit: Payer: Self-pay

## 2021-01-21 ENCOUNTER — Ambulatory Visit (INDEPENDENT_AMBULATORY_CARE_PROVIDER_SITE_OTHER): Payer: Medicare Other

## 2021-01-21 DIAGNOSIS — I495 Sick sinus syndrome: Secondary | ICD-10-CM

## 2021-01-21 NOTE — Patient Instructions (Signed)
   After Your Pacemaker   Monitor your pacemaker site for redness, swelling, and drainage. Call the device clinic at [phone] if you experience these symptoms or fever/chills.  Your incision was closed with Steri-strips or staples:  You may shower 7 days after your procedure and wash your incision with soap and water. Avoid lotions, ointments, or perfumes over your incision until it is well-healed.  You may use a hot tub or a pool after your wound check appointment if the incision is completely closed.  Do not lift, push or pull greater than 10 pounds with the affected arm until 6 weeks after your procedure. There are no other restrictions in arm movement after your wound check appointment.  You may drive, unless driving has been restricted by your healthcare providers.  Your Pacemaker is not MRI compatible.  Remote monitoring is used to monitor your pacemaker from home. This monitoring is scheduled every 91 days by our office. It allows [place] to keep an eye on the functioning of your device to ensure it is working properly. You will routinely see your Electrophysiologist annually (more often if necessary).

## 2021-01-22 LAB — CUP PACEART INCLINIC DEVICE CHECK
Battery Remaining Longevity: 181 mo
Battery Voltage: 3.23 V
Brady Statistic AP VP Percent: 0 %
Brady Statistic AP VS Percent: 0 %
Brady Statistic AS VP Percent: 27.76 %
Brady Statistic AS VS Percent: 72.24 %
Brady Statistic RA Percent Paced: 0 %
Brady Statistic RV Percent Paced: 27.76 %
Date Time Interrogation Session: 20220914121300
Implantable Lead Implant Date: 19980116
Implantable Lead Implant Date: 19980116
Implantable Lead Location: 753859
Implantable Lead Location: 753860
Implantable Lead Model: 5034
Implantable Lead Model: 5534
Implantable Pulse Generator Implant Date: 20220830
Lead Channel Impedance Value: 1026 Ohm
Lead Channel Impedance Value: 1083 Ohm
Lead Channel Impedance Value: 1140 Ohm
Lead Channel Impedance Value: 1159 Ohm
Lead Channel Pacing Threshold Amplitude: 0.5 V
Lead Channel Pacing Threshold Pulse Width: 0.4 ms
Lead Channel Sensing Intrinsic Amplitude: 14.875 mV
Lead Channel Sensing Intrinsic Amplitude: 2.25 mV
Lead Channel Setting Pacing Amplitude: 2 V
Lead Channel Setting Pacing Pulse Width: 0.4 ms
Lead Channel Setting Sensing Sensitivity: 5.6 mV

## 2021-01-22 NOTE — Progress Notes (Signed)
Wound check appointment. Steri-strips removed. Wound without redness or edema. Incision edges approximated, wound well healed. Normal device function. Thresholds, sensing, and impedances consistent with implant measurements.  5 NSVT episodes with irregular R-R intervals appears to be AF with RVR. Longest 1 second. + OAC. Device programmed at chronic lead safety margin settings. Histogram distribution appropriate for patient and level of activity. Patient educated about wound care, arm mobility, lifting restrictions. Patient enrolled in remote monitoring with next transmission scheduled 04/10/21. 91 day post gen change appointment with Dr. Rayann Heman 04/17/21.

## 2021-01-27 DIAGNOSIS — M5412 Radiculopathy, cervical region: Secondary | ICD-10-CM | POA: Diagnosis not present

## 2021-01-27 DIAGNOSIS — M542 Cervicalgia: Secondary | ICD-10-CM | POA: Diagnosis not present

## 2021-02-06 DIAGNOSIS — E7800 Pure hypercholesterolemia, unspecified: Secondary | ICD-10-CM | POA: Diagnosis not present

## 2021-02-06 DIAGNOSIS — I10 Essential (primary) hypertension: Secondary | ICD-10-CM | POA: Diagnosis not present

## 2021-02-08 ENCOUNTER — Other Ambulatory Visit: Payer: Self-pay | Admitting: Cardiology

## 2021-02-09 NOTE — Telephone Encounter (Signed)
Prescription refill request for Eliquis received. Indication:  Atrial Fib Last office visit: 12/12/20  Lenna Sciara Allred MD Scr: 0.92 on 12/16/20 Age: 87 Weight: 58kg  Based on above findings Eliquis 2.5mg  twice daily is the appropriate dose.  Refill approved.

## 2021-02-11 DIAGNOSIS — M1380 Other specified arthritis, unspecified site: Secondary | ICD-10-CM | POA: Diagnosis not present

## 2021-03-06 DIAGNOSIS — Z23 Encounter for immunization: Secondary | ICD-10-CM | POA: Diagnosis not present

## 2021-03-09 DIAGNOSIS — E7800 Pure hypercholesterolemia, unspecified: Secondary | ICD-10-CM | POA: Diagnosis not present

## 2021-03-09 DIAGNOSIS — I10 Essential (primary) hypertension: Secondary | ICD-10-CM | POA: Diagnosis not present

## 2021-03-11 ENCOUNTER — Ambulatory Visit: Payer: Medicare Other | Admitting: Cardiology

## 2021-03-11 NOTE — Progress Notes (Deleted)
Clinical Summary Ms. Bartram is a 87 y.o.female seen today for follow up of the following medical problems.    1. Chronic diastolic HF/Lymphedema/Leg swelling - admit to [facility] 07/2017 with fluid overload -07/2017 echo showed LVEF 55-60%. Diuresed with improved symptoms    - completed lymphedema clinic in 01/2018. Has home compression stockings and pump.      - swelling continues to improve - uses lymphedema pump - she is off lasix, no recurrent swelling off diuretic   2. Permanent pacemaker - 12/2020 gen change    3. Afib - rare infrequent palpitations.    4. Hypertrophic CM - echo with severe asymmetric septal hypertrophy in 2016, no gradient.  - no symptoms     5. Hyperlipidemia -compliant with statin - labs followed by pcp   6. CKD - followed by Dr Theador Hawthorne   Past Medical History:  Diagnosis Date   Anxiety    Arthritis    CHF (congestive heart failure) ([place])    Coronary artery disease    Diabetes mellitus without complication ([facility])    Dysrhythmia    a-fib   Fatty tumor    History of gout    Hypertension    Myocardial infarction [facility])    Permanent atrial fibrillation ([facility])    Presence of permanent cardiac pacemaker    MDT ADDR01/Implanted: 05/31/08   Sleep apnea    cannot tolerate CPAP   Symptomatic bradycardia      Allergies  Allergen Reactions   Atorvastatin Swelling and Cough     Current Outpatient Medications  Medication Sig Dispense Refill   acetaminophen (TYLENOL) 650 MG CR tablet Take 650 mg by mouth 2 (two) times daily as needed for pain.      allopurinol (ZYLOPRIM) 300 MG tablet Take 300 mg by mouth daily.     ascorbic acid (VITAMIN C) 500 MG tablet Take 500 mg by mouth daily.     DULoxetine (CYMBALTA) 30 MG capsule Take 30 mg by mouth at bedtime.     ELIQUIS 2.5 MG TABS tablet TAKE 1 TABLET BY MOUTH TWICE DAILY 60 tablet 6   HYDROcodone-acetaminophen (NORCO/VICODIN) 5-325 MG tablet Take 1 tablet by mouth every 6 (six) hours as  needed for severe pain.     potassium chloride SA (K-DUR,KLOR-CON) 20 MEQ tablet Take 20 mEq by mouth daily.     simvastatin (ZOCOR) 40 MG tablet Take 40 mg by mouth at bedtime.     triamcinolone cream (KENALOG) 0.1 % Apply 1 application topically 2 (two) times daily.     No current facility-administered medications for this visit.     Past Surgical History:  Procedure Laterality Date   ABDOMINAL HYSTERECTOMY     BROW LIFT Bilateral 03/10/2018   Procedure: BLEPHAROPLASTY BILATERAL UPPER EYELIDS;  Surgeon: Baruch Goldmann, MD;  Location: [facility];  Service: Ophthalmology;  Laterality: Bilateral;   CATARACT EXTRACTION W/PHACO Left 10/17/2017   Procedure: CATARACT EXTRACTION PHACO AND INTRAOCULAR LENS PLACEMENT LEFT EYE;  Surgeon: Tonny , MD;  Location: [facility];  Service: Ophthalmology;  Laterality: Left;  CDE: 11.64   CATARACT EXTRACTION W/PHACO Right 10/31/2017   Procedure: CATARACT EXTRACTION PHACO AND INTRAOCULAR LENS PLACEMENT RIGHT EYE;  Surgeon: Tonny , MD;  Location: [facility];  Service: Ophthalmology;  Laterality: Right;  CDE: 11.19   fatty tumor removal to left arm Right    PACEMAKER GENERATOR CHANGE  05/31/08   at [facility]: MDT ADDR01/Implanted: 05/31/08 as gen change by Dr Reinaldo Berber   PACEMAKER INSERTION  [place] N/A 01/06/2021   Procedure: PPM GENERATOR CHANGEOUT;  Surgeon: Thompson Grayer, MD;  Location: [facility];  Service: Cardiovascular;  Laterality: N/A;     Allergies  Allergen Reactions   Atorvastatin Swelling and Cough      Family History  Problem Relation Age of Onset   Heart failure Mother      Social History Ms. Smalls reports that she quit smoking about 46 years ago. Her smoking use included cigarettes. She started smoking about 76 years ago. She has a 30.00 pack-year smoking history. She has never used smokeless tobacco. Ms. Rogacki reports no history of alcohol use.   Review of Systems CONSTITUTIONAL: No weight loss,  fever, chills, weakness or fatigue.  HEENT: Eyes: No visual loss, blurred vision, double vision or yellow sclerae.No hearing loss, sneezing, congestion, runny nose or sore throat.  SKIN: No rash or itching.  CARDIOVASCULAR:  RESPIRATORY: No shortness of breath, cough or sputum.  GASTROINTESTINAL: No anorexia, nausea, vomiting or diarrhea. No abdominal pain or blood.  GENITOURINARY: No burning on urination, no polyuria NEUROLOGICAL: No headache, dizziness, syncope, paralysis, ataxia, numbness or tingling in the extremities. No change in bowel or bladder control.  MUSCULOSKELETAL: No muscle, back pain, joint pain or stiffness.  LYMPHATICS: No enlarged nodes. No history of splenectomy.  PSYCHIATRIC: No history of depression or anxiety.  ENDOCRINOLOGIC: No reports of sweating, cold or heat intolerance. No polyuria or polydipsia.  Marland Kitchen   Physical Examination There were no vitals filed for this visit. There were no vitals filed for this visit.  Gen: resting comfortably, no acute distress HEENT: no scleral icterus, pupils equal round and reactive, no palptable cervical adenopathy,  CV Resp: Clear to auscultation bilaterally GI: abdomen is soft, non-tender, non-distended, normal bowel sounds, no hepatosplenomegaly MSK: extremities are warm, no edema.  Skin: warm, no rash Neuro:  no focal deficits Psych: appropriate affect   Diagnostic Studies  09/2014 echo Study Conclusions  - Left ventricle: The cavity size was normal. There was severe   asymmetric hypertrophy of the septum. Systolic function was   normal. The estimated ejection fraction was in the range of 60%   to 65%. Wall motion was normal; there were no regional wall   motion abnormalities. - Aortic valve: There was mild regurgitation. - Mitral valve: There was mild regurgitation directed centrally. - Left atrium: The atrium was moderately to severely dilated. - Tricuspid valve: There was moderate regurgitation. - Pulmonary  arteries: Systolic pressure was mildly increased. PA   peak pressure: 41 mm Hg (S).  Impressions:  - Appearance is consistent with hypertrophic cardiomyopathy with no   outflow tract obstruction (at rest).     Assessment and Plan  1.Chronic diastolic HF/ LE edema/Lymphedema -doing well from swelling standpiont, currently off diuretic it appears, not clear which provider stopped it but reasonable to be off, swelling doing ok without diuretic.      2. Afib - overall doing well, continue curren tmeds   3. Permanent pacemaker -normal device check last month, no symptoms, conitnue to monitor.       [name], M.D., F.A.C.C.

## 2021-03-12 ENCOUNTER — Ambulatory Visit (INDEPENDENT_AMBULATORY_CARE_PROVIDER_SITE_OTHER): Payer: Medicare Other | Admitting: Cardiology

## 2021-03-12 ENCOUNTER — Encounter: Payer: Self-pay | Admitting: *Deleted

## 2021-03-12 VITALS — BP 160/82 | HR 78 | Ht 65.0 in | Wt 136.4 lb

## 2021-03-12 DIAGNOSIS — I4891 Unspecified atrial fibrillation: Secondary | ICD-10-CM | POA: Diagnosis not present

## 2021-03-12 DIAGNOSIS — I5032 Chronic diastolic (congestive) heart failure: Secondary | ICD-10-CM

## 2021-03-12 DIAGNOSIS — I890 Lymphedema, not elsewhere classified: Secondary | ICD-10-CM | POA: Diagnosis not present

## 2021-03-12 NOTE — Patient Instructions (Addendum)
Medication Instructions:  Continue all current medications.  Labwork: none  Testing/Procedures: none  Follow-Up: Your physician wants you to follow up in: 6 months.  You will receive a reminder letter in the mail one-two months in advance.  If you don't receive a letter, please call our office to schedule the follow up appointment   Any Other Special Instructions Will Be Listed Below (If Applicable). Please call the office on Monday with update on blood pressure readings.   If you need a refill on your cardiac medications before your next appointment, please call your pharmacy.

## 2021-03-12 NOTE — Progress Notes (Signed)
Clinical Summary Megan Walsh is a 87 y.o.female seen today for follow up of the following medical problems.    1. Chronic diastolic HF/Lymphedema/Leg swelling - admit to [facility] 07/2017 with fluid overload -07/2017 echo showed LVEF 55-60%. Diuresed with improved symptoms    - completed lymphedema clinic in 01/2018. Has home compression stockings and pump.     - limited swelling, uses lymphedema pump 2 times per week. Previously had come off lasix by another provider.    2. Permanent pacemaker - 12/2020 gen change - followed by ep     3. Afib - no recent palpitatoins - compliant with meds - no bleeding on eliquis.    4. Hypertrophic CM - echo with severe asymmetric septal hypertrophy in 2016, no gradient.  - no symptoms     5. Hyperlipidemia -labs followed by pcp,continue simvastatin.    6. CKD - followed by Dr Theador Hawthorne   Past Medical History:  Diagnosis Date   Anxiety    Arthritis    CHF (congestive heart failure) ([place])    Coronary artery disease    Diabetes mellitus without complication (HCC)    Dysrhythmia    a-fib   Fatty tumor    History of gout    Hypertension    Myocardial infarction [facility])    Permanent atrial fibrillation (HCC)    Presence of permanent cardiac pacemaker    MDT ADDR01/Implanted: 05/31/08   Sleep apnea    cannot tolerate CPAP   Symptomatic bradycardia      Allergies  Allergen Reactions   Atorvastatin Swelling and Cough     Current Outpatient Medications  Medication Sig Dispense Refill   acetaminophen (TYLENOL) 650 MG CR tablet Take 650 mg by mouth 2 (two) times daily as needed for pain.      allopurinol (ZYLOPRIM) 300 MG tablet Take 300 mg by mouth daily.     ascorbic acid (VITAMIN C) 500 MG tablet Take 500 mg by mouth daily.     DULoxetine (CYMBALTA) 30 MG capsule Take 30 mg by mouth at bedtime.     ELIQUIS 2.5 MG TABS tablet TAKE 1 TABLET BY MOUTH TWICE DAILY 60 tablet 6   HYDROcodone-acetaminophen (NORCO/VICODIN) 5-325  MG tablet Take 1 tablet by mouth every 6 (six) hours as needed for severe pain.     potassium chloride SA (K-DUR,KLOR-CON) 20 MEQ tablet Take 20 mEq by mouth daily.     simvastatin (ZOCOR) 40 MG tablet Take 40 mg by mouth at bedtime.     triamcinolone cream (KENALOG) 0.1 % Apply 1 application topically 2 (two) times daily.     No current facility-administered medications for this visit.     Past Surgical History:  Procedure Laterality Date   ABDOMINAL HYSTERECTOMY     BROW LIFT Bilateral 03/10/2018   Procedure: BLEPHAROPLASTY BILATERAL UPPER EYELIDS;  Surgeon: Baruch Goldmann, MD;  Location: [facility];  Service: Ophthalmology;  Laterality: Bilateral;   CATARACT EXTRACTION W/PHACO Left 10/17/2017   Procedure: CATARACT EXTRACTION PHACO AND INTRAOCULAR LENS PLACEMENT LEFT EYE;  Surgeon: Tonny , MD;  Location: [facility];  Service: Ophthalmology;  Laterality: Left;  CDE: 11.64   CATARACT EXTRACTION W/PHACO Right 10/31/2017   Procedure: CATARACT EXTRACTION PHACO AND INTRAOCULAR LENS PLACEMENT RIGHT EYE;  Surgeon: Tonny , MD;  Location: [facility];  Service: Ophthalmology;  Laterality: Right;  CDE: 11.19   fatty tumor removal to left arm Right    PACEMAKER GENERATOR CHANGE  05/31/08   at [facility]: MDT ADDR01/Implanted:  05/31/08 as gen change by Dr Reinaldo Berber   PACEMAKER INSERTION  1992   PPM GENERATOR CHANGEOUT N/A 01/06/2021   Procedure: PPM GENERATOR CHANGEOUT;  Surgeon: Thompson Grayer, MD;  Location: [place] [facility];  Service: Cardiovascular;  Laterality: N/A;     Allergies  Allergen Reactions   Atorvastatin Swelling and Cough      Family History  Problem Relation Age of Onset   Heart failure Mother      Social History Megan Walsh reports that she quit smoking about 46 years ago. Her smoking use included cigarettes. She started smoking about 76 years ago. She has a 30.00 pack-year smoking history. She has never used smokeless tobacco. Megan Walsh reports no history of alcohol  use.   Review of Systems CONSTITUTIONAL: No weight loss, fever, chills, weakness or fatigue.  HEENT: Eyes: No visual loss, blurred vision, double vision or yellow sclerae.No hearing loss, sneezing, congestion, runny nose or sore throat.  SKIN: No rash or itching.  CARDIOVASCULAR: per hpi RESPIRATORY: No shortness of breath, cough or sputum.  GASTROINTESTINAL: No anorexia, nausea, vomiting or diarrhea. No abdominal pain or blood.  GENITOURINARY: No burning on urination, no polyuria NEUROLOGICAL: No headache, dizziness, syncope, paralysis, ataxia, numbness or tingling in the extremities. No change in bowel or bladder control.  MUSCULOSKELETAL: No muscle, back pain, joint pain or stiffness.  LYMPHATICS: No enlarged nodes. No history of splenectomy.  PSYCHIATRIC: No history of depression or anxiety.  ENDOCRINOLOGIC: No reports of sweating, cold or heat intolerance. No polyuria or polydipsia.  Marland Kitchen   Physical Examination Today's Vitals   03/12/21 1036  BP: (!) 160/82  Pulse: 78  SpO2: 99%  Weight: 136 lb 6.4 oz (61.9 kg)  Height: 5\' 5"  (1.651 m)   Body mass index is 22.7 kg/m.  Gen: resting comfortably, no acute distress HEENT: no scleral icterus, pupils equal round and reactive, no palptable cervical adenopathy,  CV: RRR, no m/r/g no jvd Resp: Clear to auscultation bilaterally GI: abdomen is soft, non-tender, non-distended, normal bowel sounds, no hepatosplenomegaly MSK: extremities are warm, no edema.  Skin: warm, no rash Neuro:  no focal deficits Psych: appropriate affect   Diagnostic Studies  09/2014 echo Study Conclusions  - Left ventricle: The cavity size was normal. There was severe   asymmetric hypertrophy of the septum. Systolic function was   normal. The estimated ejection fraction was in the range of 60%   to 65%. Wall motion was normal; there were no regional wall   motion abnormalities. - Aortic valve: There was mild regurgitation. - Mitral valve: There was  mild regurgitation directed centrally. - Left atrium: The atrium was moderately to severely dilated. - Tricuspid valve: There was moderate regurgitation. - Pulmonary arteries: Systolic pressure was mildly increased. PA   peak pressure: 41 mm Hg (S).  Impressions:  - Appearance is consistent with hypertrophic cardiomyopathy with no   outflow tract obstruction (at rest).     Assessment and Plan   1.Chronic diastolic HF/ LE edema/Lymphedema -doing well from swelling standpiont, currently off diuretic it appears, not clear which provider stopped it but reasonable to be off, - residual mild lympohedema apeparing swelling, continue current treatments.      2. Afib -no symptoms, continue current meds including anticoagulation      [name], MD

## 2021-03-17 DIAGNOSIS — M542 Cervicalgia: Secondary | ICD-10-CM | POA: Diagnosis not present

## 2021-03-17 DIAGNOSIS — M5412 Radiculopathy, cervical region: Secondary | ICD-10-CM | POA: Diagnosis not present

## 2021-03-17 DIAGNOSIS — G250 Essential tremor: Secondary | ICD-10-CM | POA: Diagnosis not present

## 2021-03-17 DIAGNOSIS — R2689 Other abnormalities of gait and mobility: Secondary | ICD-10-CM | POA: Diagnosis not present

## 2021-03-17 DIAGNOSIS — M545 Low back pain, unspecified: Secondary | ICD-10-CM | POA: Diagnosis not present

## 2021-03-24 DIAGNOSIS — M549 Dorsalgia, unspecified: Secondary | ICD-10-CM | POA: Diagnosis not present

## 2021-03-24 DIAGNOSIS — Z299 Encounter for prophylactic measures, unspecified: Secondary | ICD-10-CM | POA: Diagnosis not present

## 2021-03-24 DIAGNOSIS — E119 Type 2 diabetes mellitus without complications: Secondary | ICD-10-CM | POA: Diagnosis not present

## 2021-03-24 DIAGNOSIS — E1122 Type 2 diabetes mellitus with diabetic chronic kidney disease: Secondary | ICD-10-CM | POA: Diagnosis not present

## 2021-03-24 DIAGNOSIS — I10 Essential (primary) hypertension: Secondary | ICD-10-CM | POA: Diagnosis not present

## 2021-03-24 DIAGNOSIS — E1165 Type 2 diabetes mellitus with hyperglycemia: Secondary | ICD-10-CM | POA: Diagnosis not present

## 2021-04-10 ENCOUNTER — Ambulatory Visit (INDEPENDENT_AMBULATORY_CARE_PROVIDER_SITE_OTHER): Payer: Medicare Other

## 2021-04-10 DIAGNOSIS — Z950 Presence of cardiac pacemaker: Secondary | ICD-10-CM

## 2021-04-10 DIAGNOSIS — I495 Sick sinus syndrome: Secondary | ICD-10-CM | POA: Diagnosis not present

## 2021-04-13 LAB — CUP PACEART REMOTE DEVICE CHECK
Battery Remaining Longevity: 179 mo
Battery Voltage: 3.22 V
Brady Statistic AP VP Percent: 0 %
Brady Statistic AP VS Percent: 0 %
Brady Statistic AS VP Percent: 30.34 %
Brady Statistic AS VS Percent: 69.66 %
Brady Statistic RA Percent Paced: 0 %
Brady Statistic RV Percent Paced: 30.34 %
Date Time Interrogation Session: 20221202000446
Implantable Lead Implant Date: 19980116
Implantable Lead Implant Date: 19980116
Implantable Lead Location: 753859
Implantable Lead Location: 753860
Implantable Lead Model: 5034
Implantable Lead Model: 5534
Implantable Pulse Generator Implant Date: 20220830
Lead Channel Impedance Value: 1083 Ohm
Lead Channel Impedance Value: 1083 Ohm
Lead Channel Impedance Value: 1140 Ohm
Lead Channel Impedance Value: 1235 Ohm
Lead Channel Pacing Threshold Amplitude: 0.75 V
Lead Channel Pacing Threshold Pulse Width: 0.4 ms
Lead Channel Sensing Intrinsic Amplitude: 16 mV
Lead Channel Sensing Intrinsic Amplitude: 16 mV
Lead Channel Sensing Intrinsic Amplitude: 2.25 mV
Lead Channel Setting Pacing Amplitude: 2 V
Lead Channel Setting Pacing Pulse Width: 0.4 ms
Lead Channel Setting Sensing Sensitivity: 5.6 mV

## 2021-04-17 ENCOUNTER — Encounter: Payer: Medicare Other | Admitting: Internal Medicine

## 2021-04-17 DIAGNOSIS — S0003XA Contusion of scalp, initial encounter: Secondary | ICD-10-CM | POA: Diagnosis not present

## 2021-04-17 DIAGNOSIS — R42 Dizziness and giddiness: Secondary | ICD-10-CM | POA: Diagnosis not present

## 2021-04-17 DIAGNOSIS — W182XXA Fall in (into) shower or empty bathtub, initial encounter: Secondary | ICD-10-CM | POA: Diagnosis not present

## 2021-04-17 DIAGNOSIS — M419 Scoliosis, unspecified: Secondary | ICD-10-CM | POA: Diagnosis not present

## 2021-04-17 DIAGNOSIS — Z043 Encounter for examination and observation following other accident: Secondary | ICD-10-CM | POA: Diagnosis not present

## 2021-04-17 DIAGNOSIS — Z79899 Other long term (current) drug therapy: Secondary | ICD-10-CM | POA: Diagnosis not present

## 2021-04-17 DIAGNOSIS — I10 Essential (primary) hypertension: Secondary | ICD-10-CM | POA: Diagnosis not present

## 2021-04-17 DIAGNOSIS — S0990XA Unspecified injury of head, initial encounter: Secondary | ICD-10-CM | POA: Diagnosis not present

## 2021-04-17 DIAGNOSIS — S40012A Contusion of left shoulder, initial encounter: Secondary | ICD-10-CM | POA: Diagnosis not present

## 2021-04-17 DIAGNOSIS — W19XXXA Unspecified fall, initial encounter: Secondary | ICD-10-CM | POA: Diagnosis not present

## 2021-04-21 DIAGNOSIS — I10 Essential (primary) hypertension: Secondary | ICD-10-CM | POA: Diagnosis not present

## 2021-04-21 DIAGNOSIS — W19XXXA Unspecified fall, initial encounter: Secondary | ICD-10-CM | POA: Diagnosis not present

## 2021-04-21 DIAGNOSIS — S0010XS Contusion of unspecified eyelid and periocular area, sequela: Secondary | ICD-10-CM | POA: Diagnosis not present

## 2021-04-21 DIAGNOSIS — Y92009 Unspecified place in unspecified non-institutional (private) residence as the place of occurrence of the external cause: Secondary | ICD-10-CM | POA: Diagnosis not present

## 2021-04-21 DIAGNOSIS — Z299 Encounter for prophylactic measures, unspecified: Secondary | ICD-10-CM | POA: Diagnosis not present

## 2021-04-21 NOTE — Progress Notes (Signed)
Remote pacemaker transmission.   

## 2021-04-28 DIAGNOSIS — B351 Tinea unguium: Secondary | ICD-10-CM | POA: Diagnosis not present

## 2021-04-28 DIAGNOSIS — L84 Corns and callosities: Secondary | ICD-10-CM | POA: Diagnosis not present

## 2021-04-28 DIAGNOSIS — M79676 Pain in unspecified toe(s): Secondary | ICD-10-CM | POA: Diagnosis not present

## 2021-04-28 DIAGNOSIS — E1142 Type 2 diabetes mellitus with diabetic polyneuropathy: Secondary | ICD-10-CM | POA: Diagnosis not present

## 2021-05-08 DIAGNOSIS — E7800 Pure hypercholesterolemia, unspecified: Secondary | ICD-10-CM | POA: Diagnosis not present

## 2021-05-08 DIAGNOSIS — E11319 Type 2 diabetes mellitus with unspecified diabetic retinopathy without macular edema: Secondary | ICD-10-CM | POA: Diagnosis not present

## 2021-05-15 ENCOUNTER — Ambulatory Visit (INDEPENDENT_AMBULATORY_CARE_PROVIDER_SITE_OTHER): Payer: Medicare Other | Admitting: Internal Medicine

## 2021-05-15 ENCOUNTER — Encounter: Payer: Self-pay | Admitting: Internal Medicine

## 2021-05-15 VITALS — Ht 64.5 in | Wt 138.0 lb

## 2021-05-15 DIAGNOSIS — D6869 Other thrombophilia: Secondary | ICD-10-CM

## 2021-05-15 DIAGNOSIS — Z950 Presence of cardiac pacemaker: Secondary | ICD-10-CM

## 2021-05-15 DIAGNOSIS — I5032 Chronic diastolic (congestive) heart failure: Secondary | ICD-10-CM | POA: Diagnosis not present

## 2021-05-15 DIAGNOSIS — I4821 Permanent atrial fibrillation: Secondary | ICD-10-CM

## 2021-05-15 DIAGNOSIS — I495 Sick sinus syndrome: Secondary | ICD-10-CM

## 2021-05-15 NOTE — Progress Notes (Signed)
PCP: Monico Blitz, MD Primary Cardiologist: Dr Harl Bowie Primary EP:  Dr [name] is a 88 y.o. female who presents today for routine electrophysiology followup.  Since her generator change, the patient reports doing very well.  Today, she denies symptoms of palpitations, chest pain, shortness of breath,  lower extremity edema, dizziness, presyncope, or syncope.  The patient is otherwise without complaint today.   Past Medical History:  Diagnosis Date   Anxiety    Arthritis    CHF (congestive heart failure) ([place])    Coronary artery disease    Diabetes mellitus without complication ([place])    Dysrhythmia    a-fib   Fatty tumor    History of gout    Hypertension    Myocardial infarction [facility])    Permanent atrial fibrillation ([place])    Presence of permanent cardiac pacemaker    MDT ADDR01/Implanted: 05/31/08   Sleep apnea    cannot tolerate CPAP   Symptomatic bradycardia    Past Surgical History:  Procedure Laterality Date   ABDOMINAL HYSTERECTOMY     BROW LIFT Bilateral 03/10/2018   Procedure: BLEPHAROPLASTY BILATERAL UPPER EYELIDS;  Surgeon: Baruch Goldmann, MD;  Location: [facility];  Service: Ophthalmology;  Laterality: Bilateral;   CATARACT EXTRACTION W/PHACO Left 10/17/2017   Procedure: CATARACT EXTRACTION PHACO AND INTRAOCULAR LENS PLACEMENT LEFT EYE;  Surgeon: Tonny Branch, MD;  Location: [facility];  Service: Ophthalmology;  Laterality: Left;  CDE: 11.64   CATARACT EXTRACTION W/PHACO Right 10/31/2017   Procedure: CATARACT EXTRACTION PHACO AND INTRAOCULAR LENS PLACEMENT RIGHT EYE;  Surgeon: Tonny Branch, MD;  Location: [facility];  Service: Ophthalmology;  Laterality: Right;  CDE: 11.19   fatty tumor removal to left arm Right    PACEMAKER GENERATOR CHANGE  05/31/08   at [facility]: MDT ADDR01/Implanted: 05/31/08 as gen change by Dr Reinaldo Berber   PACEMAKER INSERTION  1992   PPM GENERATOR CHANGEOUT N/A 01/06/2021   Procedure: PPM GENERATOR CHANGEOUT;  Surgeon: Thompson Grayer, MD;   Location: [place] [facility];  Service: Cardiovascular;  Laterality: N/A;    ROS- all systems are reviewed and negative except as per HPI above  Current Outpatient Medications  Medication Sig Dispense Refill   acetaminophen (TYLENOL) 650 MG CR tablet Take 650 mg by mouth 2 (two) times daily as needed for pain.      allopurinol (ZYLOPRIM) 300 MG tablet Take 300 mg by mouth daily.     ascorbic acid (VITAMIN C) 500 MG tablet Take 500 mg by mouth daily.     DULoxetine (CYMBALTA) 20 MG capsule Take 20 mg by mouth daily.     ELIQUIS 2.5 MG TABS tablet TAKE 1 TABLET BY MOUTH TWICE DAILY 60 tablet 6   HYDROcodone-acetaminophen (NORCO/VICODIN) 5-325 MG tablet Take 1 tablet by mouth every 6 (six) hours as needed for severe pain.     potassium chloride SA (K-DUR,KLOR-CON) 20 MEQ tablet Take 20 mEq by mouth daily.     simvastatin (ZOCOR) 40 MG tablet Take 40 mg by mouth at bedtime.     triamcinolone cream (KENALOG) 0.1 % Apply 1 application topically 2 (two) times daily as needed.     No current facility-administered medications for this visit.    Physical Exam: Vitals:   05/15/21 1440  Weight: 138 lb (62.6 kg)  Height: 5' 4.5" (1.638 m)    GEN- The patient is well appearing, alert and oriented x 3 today.   Head- normocephalic, atraumatic Eyes-  Sclera clear, conjunctiva pink Ears-  hearing intact Oropharynx- clear Lungs- Clear to ausculation bilaterally, normal work of breathing Chest- pacemaker pocket is well healed Heart- iRRR GI- soft, NT, ND, + BS Extremities- no clubbing, cyanosis, or edema  Pacemaker interrogation- reviewed in detail today,  See PACEART report +  Assessment and Plan:  1. Symptomatic second degree heart block Normal pacemaker function See Pace Art report No changes today she is not device dependant today  2. Permanent afib On coumadin for chads2vasc score of 7 Mostly rate controlled but with occasional RVR  3. Chronic diastolic dysfunction Stable No  change required today  Return in a year  Thompson Grayer MD, [facility] 05/15/2021 3:12 PM

## 2021-05-15 NOTE — Patient Instructions (Addendum)
Medication Instructions:  Continue all current medications.  Labwork: none  Testing/Procedures: none  Follow-Up: 1 year - Dr.  Allred   Any Other Special Instructions Will Be Listed Below (If Applicable).   If you need a refill on your cardiac medications before your next appointment, please call your pharmacy.  

## 2021-06-05 DIAGNOSIS — Z87891 Personal history of nicotine dependence: Secondary | ICD-10-CM | POA: Diagnosis not present

## 2021-06-05 DIAGNOSIS — I951 Orthostatic hypotension: Secondary | ICD-10-CM | POA: Diagnosis not present

## 2021-06-05 DIAGNOSIS — I10 Essential (primary) hypertension: Secondary | ICD-10-CM | POA: Diagnosis not present

## 2021-06-05 DIAGNOSIS — E1142 Type 2 diabetes mellitus with diabetic polyneuropathy: Secondary | ICD-10-CM | POA: Diagnosis not present

## 2021-06-05 DIAGNOSIS — Z6823 Body mass index (BMI) 23.0-23.9, adult: Secondary | ICD-10-CM | POA: Diagnosis not present

## 2021-06-05 DIAGNOSIS — Z299 Encounter for prophylactic measures, unspecified: Secondary | ICD-10-CM | POA: Diagnosis not present

## 2021-06-05 DIAGNOSIS — E1165 Type 2 diabetes mellitus with hyperglycemia: Secondary | ICD-10-CM | POA: Diagnosis not present

## 2021-06-09 DIAGNOSIS — K5900 Constipation, unspecified: Secondary | ICD-10-CM | POA: Diagnosis not present

## 2021-06-09 DIAGNOSIS — G250 Essential tremor: Secondary | ICD-10-CM | POA: Diagnosis not present

## 2021-06-09 DIAGNOSIS — M79609 Pain in unspecified limb: Secondary | ICD-10-CM | POA: Diagnosis not present

## 2021-06-09 DIAGNOSIS — M5412 Radiculopathy, cervical region: Secondary | ICD-10-CM | POA: Diagnosis not present

## 2021-06-29 DIAGNOSIS — Z6823 Body mass index (BMI) 23.0-23.9, adult: Secondary | ICD-10-CM | POA: Diagnosis not present

## 2021-06-29 DIAGNOSIS — I10 Essential (primary) hypertension: Secondary | ICD-10-CM | POA: Diagnosis not present

## 2021-06-29 DIAGNOSIS — E1165 Type 2 diabetes mellitus with hyperglycemia: Secondary | ICD-10-CM | POA: Diagnosis not present

## 2021-06-29 DIAGNOSIS — I5032 Chronic diastolic (congestive) heart failure: Secondary | ICD-10-CM | POA: Diagnosis not present

## 2021-06-29 DIAGNOSIS — Z87891 Personal history of nicotine dependence: Secondary | ICD-10-CM | POA: Diagnosis not present

## 2021-06-29 DIAGNOSIS — Z299 Encounter for prophylactic measures, unspecified: Secondary | ICD-10-CM | POA: Diagnosis not present

## 2021-06-29 DIAGNOSIS — M549 Dorsalgia, unspecified: Secondary | ICD-10-CM | POA: Diagnosis not present

## 2021-07-07 DIAGNOSIS — I10 Essential (primary) hypertension: Secondary | ICD-10-CM | POA: Diagnosis not present

## 2021-07-07 DIAGNOSIS — E7800 Pure hypercholesterolemia, unspecified: Secondary | ICD-10-CM | POA: Diagnosis not present

## 2021-07-10 ENCOUNTER — Ambulatory Visit (INDEPENDENT_AMBULATORY_CARE_PROVIDER_SITE_OTHER): Payer: Medicare Other

## 2021-07-10 DIAGNOSIS — I495 Sick sinus syndrome: Secondary | ICD-10-CM | POA: Diagnosis not present

## 2021-07-10 LAB — CUP PACEART REMOTE DEVICE CHECK
Battery Remaining Longevity: 175 mo
Battery Voltage: 3.2 V
Brady Statistic AP VP Percent: 0 %
Brady Statistic AP VS Percent: 0 %
Brady Statistic AS VP Percent: 33.52 %
Brady Statistic AS VS Percent: 66.48 %
Brady Statistic RA Percent Paced: 0 %
Brady Statistic RV Percent Paced: 33.52 %
Date Time Interrogation Session: 20230303124023
Implantable Lead Implant Date: 19980116
Implantable Lead Implant Date: 19980116
Implantable Lead Location: 753859
Implantable Lead Location: 753860
Implantable Lead Model: 5034
Implantable Lead Model: 5534
Implantable Pulse Generator Implant Date: 20220830
Lead Channel Impedance Value: 1007 Ohm
Lead Channel Impedance Value: 1159 Ohm
Lead Channel Impedance Value: 1197 Ohm
Lead Channel Impedance Value: 1254 Ohm
Lead Channel Pacing Threshold Amplitude: 0.75 V
Lead Channel Pacing Threshold Pulse Width: 0.4 ms
Lead Channel Sensing Intrinsic Amplitude: 19.25 mV
Lead Channel Sensing Intrinsic Amplitude: 19.25 mV
Lead Channel Sensing Intrinsic Amplitude: 2.25 mV
Lead Channel Setting Pacing Amplitude: 2 V
Lead Channel Setting Pacing Pulse Width: 0.4 ms
Lead Channel Setting Sensing Sensitivity: 5.6 mV

## 2021-07-15 ENCOUNTER — Ambulatory Visit (HOSPITAL_COMMUNITY): Payer: Medicare Other | Admitting: Physical Therapy

## 2021-07-16 ENCOUNTER — Ambulatory Visit (HOSPITAL_COMMUNITY): Payer: Medicare Other | Attending: Neurology | Admitting: Physical Therapy

## 2021-07-16 ENCOUNTER — Encounter (HOSPITAL_COMMUNITY): Payer: Self-pay

## 2021-07-16 ENCOUNTER — Other Ambulatory Visit: Payer: Self-pay

## 2021-07-16 DIAGNOSIS — M6281 Muscle weakness (generalized): Secondary | ICD-10-CM | POA: Insufficient documentation

## 2021-07-16 DIAGNOSIS — R2689 Other abnormalities of gait and mobility: Secondary | ICD-10-CM | POA: Insufficient documentation

## 2021-07-16 DIAGNOSIS — R262 Difficulty in walking, not elsewhere classified: Secondary | ICD-10-CM | POA: Insufficient documentation

## 2021-07-20 NOTE — Progress Notes (Signed)
Remote pacemaker transmission.   

## 2021-07-30 ENCOUNTER — Ambulatory Visit (HOSPITAL_COMMUNITY): Payer: Medicare Other

## 2021-07-30 ENCOUNTER — Other Ambulatory Visit: Payer: Self-pay

## 2021-07-30 ENCOUNTER — Encounter (HOSPITAL_COMMUNITY): Payer: Self-pay

## 2021-07-30 DIAGNOSIS — M6281 Muscle weakness (generalized): Secondary | ICD-10-CM | POA: Diagnosis not present

## 2021-07-30 DIAGNOSIS — R2689 Other abnormalities of gait and mobility: Secondary | ICD-10-CM | POA: Diagnosis not present

## 2021-07-30 DIAGNOSIS — R262 Difficulty in walking, not elsewhere classified: Secondary | ICD-10-CM | POA: Diagnosis not present

## 2021-07-30 NOTE — Progress Notes (Addendum)
?OUTPATIENT PHYSICAL THERAPY NEURO EVALUATION ? ? ?Patient Name: Megan Walsh ?MRN: [id number] ?DOB:Sep 20, 1932, 88 y.o., female ?Today's Date: 07/31/2021 ? ?PCP: Monico Blitz, MD ?REFERRING PROVIDER: Phillips Odor, MD  ? ? PT End of Session - 07/30/21 1735   ? ? Visit Number 1   ? Number of Visits 16   ? Date for PT Re-Evaluation 08/30/21   ? Authorization Type 1) [employer] medicare 2)medicaid   ? Authorization Time Period no auth  required follow medicare guidelines   ? Progress Note Due on Visit 10   ? PT Start Time 1345   ? PT Stop Time 1425   ? PT Time Calculation (min) 40 min   ? Activity Tolerance Patient tolerated treatment well   ? Behavior During Therapy [facility] for tasks assessed/performed   ? ?  ?  ? ?  ? ? ?Past Medical History:  ?Diagnosis Date  ? Anxiety   ? Arthritis   ? CHF (congestive heart failure) ([place])   ? Coronary artery disease   ? Diabetes mellitus without complication ([place])   ? Dysrhythmia   ? a-fib  ? Fatty tumor   ? History of gout   ? Hypertension   ? Myocardial infarction [facility])   ? Permanent atrial fibrillation ([place])   ? Presence of permanent cardiac pacemaker   ? MDT ADDR01/Implanted: 05/31/08  ? Sleep apnea   ? cannot tolerate CPAP  ? Symptomatic bradycardia   ? ?Past Surgical History:  ?Procedure Laterality Date  ? ABDOMINAL HYSTERECTOMY    ? BROW LIFT Bilateral 03/10/2018  ? Procedure: BLEPHAROPLASTY BILATERAL UPPER EYELIDS;  Surgeon: Baruch Goldmann, MD;  Location: [facility];  Service: Ophthalmology;  Laterality: Bilateral;  ? CATARACT EXTRACTION W/PHACO Left 10/17/2017  ? Procedure: CATARACT EXTRACTION PHACO AND INTRAOCULAR LENS PLACEMENT LEFT EYE;  Surgeon: Tonny Branch, MD;  Location: [facility];  Service: Ophthalmology;  Laterality: Left;  CDE: 11.64  ? CATARACT EXTRACTION W/PHACO Right 10/31/2017  ? Procedure: CATARACT EXTRACTION PHACO AND INTRAOCULAR LENS PLACEMENT RIGHT EYE;  Surgeon: Tonny Branch, MD;  Location: [facility];  Service: Ophthalmology;  Laterality: Right;  CDE: 11.19  ? fatty tumor  removal to left arm Right   ? PACEMAKER GENERATOR CHANGE  05/31/08  ? at [facility]: MDT ADDR01/Implanted: 05/31/08 as gen change by Dr Reinaldo Berber  ? PACEMAKER INSERTION  1992  ? PPM GENERATOR CHANGEOUT N/A 01/06/2021  ? Procedure: PPM GENERATOR CHANGEOUT;  Surgeon: Thompson Grayer, MD;  Location: [place] [facility];  Service: Cardiovascular;  Laterality: N/A;  ? ?Patient Active Problem List  ? Diagnosis Date Noted  ? Tachycardia-bradycardia ([place]) 10/23/2014  ? Midsternal chest pain 09/24/2014  ? Chest pain 09/22/2014  ? CAD (coronary artery disease) 09/22/2014  ? Atrial fibrillation with controlled ventricular response ([place]) 09/22/2014  ? HTN (hypertension) 09/22/2014  ? Diabetes mellitus type 2, insulin dependent ([place]) 09/22/2014  ? Pacemaker - MDT ADDR01 Implanted: 05/31/08 Serial# [id number] 09/22/2014  ? History of CHF (congestive heart failure) 09/22/2014  ? Acute renal failure ([place]) 09/22/2014  ? Gout 09/22/2014  ? Abnormal EKG   ? ? ?ONSET DATE: Over 1 year, chornic in nature, fall in shower during the fall of 2021 ? ?REFERRING DIAG: Pt Eval And Tx For Falls) R26.89 Other Abnormalities of gait and Mobility-Per Phillips Odor MD  ? ?THERAPY DIAG:  ?Difficulty in walking, not elsewhere classified - Plan: PT plan of care cert/re-cert ? ?Imbalance - Plan: PT plan of care cert/re-cert ? ?Muscle weakness (generalized) - Plan: PT plan of  care cert/re-cert ? ?SUBJECTIVE:  ?                                                                                                                                                                                           ? ?SUBJECTIVE STATEMENT: ?She states she has been having some back and right knee pain as well as some DM neuropathy to B feet that affect her balance and cause her falling.  ?Pt accompanied by: self ? ?PERTINENT HISTORY: Large medical history (see above list) including HTN, cardiac conditions, DM ? ?PAIN:  ?Are you having pain? Yes: NPRS scale: 6/10 ?Pain location: Lumbar  spine ?Pain description: sharp, radiating down both legs ?Aggravating factors: walking, movement ?Relieving factors: when up to walk, pausing to balance; medication - prescription from MD, tylenol ? ?PRECAUTIONS: Fall ? ?WEIGHT BEARING RESTRICTIONS No ? ?FALLS: Has patient fallen in last 6 months? Yes, Number of falls: 2 times last month, states  ? ?LIVING ENVIRONMENT: ?Lives with: lives with their family ?Lives in: House/apartment ?Stairs: Yes: External: 6 steps; on right going up, on left going up, and can reach both ?Has following equipment at home: Gilford Rile - 4 wheeled, Wheelchair (manual), shower chair, and bed side commode ?Has aide to help with ADLs and all home needs ? ?PLOF: Independent with basic ADLs prior to shower fall 1.5 years ago, then was needing moderate assist and in wheelchair for awhile ? ?PATIENT GOALS help legs and help with balance and falls ? ?OBJECTIVE:  ? ?COGNITION: ?Overall cognitive status: Within functional limits for tasks assessed ?  ?SENSATION: ?Light touch: Impaired at B feet  ? ?COORDINATION: ?Fair able to alternate toe tap and heel tap ? ?EDEMA:  ?B LE gross edema observed into B ankles and feet - minimal edema at left knee, minimal at right knee ?L knee circumference 42cm R knee 43.4 ? ? ?MUSCLE LENGTH:      NT today ? ?DTRs:  ?Patella 1 = Trace  ? ?POSTURE: rounded shoulders, forward head, decreased lumbar lordosis, increased thoracic kyphosis, right pelvic obliquity, flexed trunk , and weight shift right ? ?LE ROM:    Gross B LE limited secondary to edema especially into B ankles, not formally tested; In sitting R knee extension limited to -10 degrees, L knee 0 degrees ? ?MMT:  Gross B LE strength screen in sitting secondary to patient needs with overall gross strength 3+/5 ? ?BED MOBILITY:  ?Not tested today secondary to time with balance focus, may need further testing as patient reports difficulty in bed ? ?TRANSFERS: ?Assistive device utilized: Environmental consultant - 4 wheeled  ?Sit to  stand: Modified independence and  SBA ?Stand to sit: Modified independence and SBA ?Chair to chair: CGA ?Floor:  Unable ? ?RAMP:  ?Level of Assistance: Modified independence ?Assistive device utilized: Environmental consultant - 4 wheeled ?Ramp Comments: not formally assessed ? ?CURB:  ?Level of Assistance: Min A ?Assistive device utilized: Environmental consultant - 4 wheeled ?Curb Comments: needs assist for lifting walker up curb and then minA for step up ? ?STAIRS: ? Level of Assistance: CGA and Min A ? Stair Negotiation Technique: Step to Pattern with Bilateral Rails ? Number of Stairs: 4  ? Height of Stairs: 4  ?Comments: with difficulty and increased time ? ?GAIT: ?Gait pattern: step through pattern, decreased step length- Right, decreased step length- Left, decreased stride length, decreased hip/knee flexion- Right, decreased hip/knee flexion- Left, shuffling, poor foot clearance- Right, and poor foot clearance- Left ?Distance walked: 50 ft ?Assistive device utilized: Environmental consultant - 4 wheeled ?Level of assistance: Modified independence ?Comments: Slow pace ? ?FUNCTIONAL TESTs:  ?Timed up and go (TUG): 1 min 16 seconds ? ?PATIENT SURVEYS:  ?None needed ? ?TODAY'S TREATMENT:  ?Evaluation and HEP for heel/toe rocking, seated marching, and hip abduction ? ? ?PATIENT EDUCATION: ?Education details: Discussion and education on evaluation findings, balance safety, POC, and HEP ?Person educated: Patient ?Education method: Explanation, Demonstration, and Handouts ?Education comprehension: verbalized understanding ? ? ?HOME EXERCISE PROGRAM: ? ?Access Code: [email or web address] ?URL: [email or web address] ?Date: 07/30/2021 ?Prepared by: Jerilynn Som ? ?Exercises ?- Seated Heel Toe Raises  - 2 x daily - 7 x weekly - 1-2 sets - 10 reps ?- Seated March  - 2 x daily - 7 x weekly - 1-2 sets - 10 reps ?- Seated Hip Abduction  - 2 x daily - 7 x weekly - 1-2 sets - 10 reps ? ? ? ? ?GOALS: ?Goals reviewed with patient? No ? ?SHORT TERM GOALS: Target date:  08/28/2021 ? ?Patient will be independent with HEP for ability to self progress with safety awareness to decrease fall risk.  ?Baseline: 07/30/21 - initiated today ?Goal status: INITIAL ? ?2.  Patient will be able to demonst

## 2021-08-04 DIAGNOSIS — L84 Corns and callosities: Secondary | ICD-10-CM | POA: Diagnosis not present

## 2021-08-04 DIAGNOSIS — E1142 Type 2 diabetes mellitus with diabetic polyneuropathy: Secondary | ICD-10-CM | POA: Diagnosis not present

## 2021-08-04 DIAGNOSIS — M79676 Pain in unspecified toe(s): Secondary | ICD-10-CM | POA: Diagnosis not present

## 2021-08-04 DIAGNOSIS — B351 Tinea unguium: Secondary | ICD-10-CM | POA: Diagnosis not present

## 2021-08-13 ENCOUNTER — Ambulatory Visit (HOSPITAL_COMMUNITY): Payer: Medicare Other | Attending: Neurology | Admitting: Physical Therapy

## 2021-08-13 DIAGNOSIS — R262 Difficulty in walking, not elsewhere classified: Secondary | ICD-10-CM

## 2021-08-13 DIAGNOSIS — R2689 Other abnormalities of gait and mobility: Secondary | ICD-10-CM | POA: Diagnosis not present

## 2021-08-13 DIAGNOSIS — M6281 Muscle weakness (generalized): Secondary | ICD-10-CM

## 2021-08-13 NOTE — Therapy (Signed)
?OUTPATIENT PHYSICAL THERAPY TREATMENT NOTE ? ? ?Patient Name: Megan Walsh ?MRN: [id number] ?DOB:[birth year], 88 y.o., female ?Today's Date: 08/13/2021 ? ?PCP: Monico Blitz, MD ?REFERRING PROVIDER: Phillips Odor, MD  ? ?END OF SESSION:  ? PT End of Session - 08/13/21 1140   ? ? Visit Number 2   ? Number of Visits 16   ? Date for PT Re-Evaluation 08/30/21   ? Authorization Type 1) UHC medicare 2)medicaid   ? Authorization Time Period no auth  required follow medicare guidelines   ? Progress Note Due on Visit 10   ? PT Start Time 1133   ? PT Stop Time 9326   ? PT Time Calculation (min) 42 min   ? Activity Tolerance Patient tolerated treatment well   ? Behavior During Therapy [facility] for tasks assessed/performed   ? ?  ?  ? ?  ? ? ?Past Medical History:  ?Diagnosis Date  ? Anxiety   ? Arthritis   ? CHF (congestive heart failure) ([place])   ? Coronary artery disease   ? Diabetes mellitus without complication ([place])   ? Dysrhythmia   ? a-fib  ? Fatty tumor   ? History of gout   ? Hypertension   ? Myocardial infarction [facility])   ? Permanent atrial fibrillation ([place])   ? Presence of permanent cardiac pacemaker   ? MDT ADDR01/Implanted: 05/31/08  ? Sleep apnea   ? cannot tolerate CPAP  ? Symptomatic bradycardia   ? ?Past Surgical History:  ?Procedure Laterality Date  ? ABDOMINAL HYSTERECTOMY    ? BROW LIFT Bilateral 03/10/2018  ? Procedure: BLEPHAROPLASTY BILATERAL UPPER EYELIDS;  Surgeon: Baruch Goldmann, MD;  [facility]: [facility];  Service: Ophthalmology;  Laterality: Bilateral;  ? CATARACT EXTRACTION W/PHACO Left 10/17/2017  ? Procedure: CATARACT EXTRACTION PHACO AND INTRAOCULAR LENS PLACEMENT LEFT EYE;  Surgeon: Tonny Branch, MD;  Location: [facility];  Service: Ophthalmology;  Laterality: Left;  CDE: 11.64  ? CATARACT EXTRACTION W/PHACO Right 10/31/2017  ? Procedure: CATARACT EXTRACTION PHACO AND INTRAOCULAR LENS PLACEMENT RIGHT EYE;  Surgeon: Tonny Branch, MD;  Location: [facility];  Service: Ophthalmology;  Laterality: Right;  CDE: 11.19  ?  fatty tumor removal to left arm Right   ? PACEMAKER GENERATOR CHANGE  05/31/08  ? at [facility]: MDT ADDR01/Implanted: 05/31/08 as gen change by Dr Reinaldo Berber  ? PACEMAKER INSERTION  1992  ? PPM GENERATOR CHANGEOUT N/A 01/06/2021  ? Procedure: PPM GENERATOR CHANGEOUT;  Surgeon: Thompson Grayer, MD;  Location: [place] CV [facility];  Service: Cardiovascular;  Laterality: N/A;  ? ?Patient Active Problem List  ? Diagnosis Date Noted  ? Tachycardia-bradycardia ([place]) 10/23/2014  ? Midsternal chest pain 09/24/2014  ? Chest pain 09/22/2014  ? CAD (coronary artery disease) 09/22/2014  ? Atrial fibrillation with controlled ventricular response ([place]) 09/22/2014  ? HTN (hypertension) 09/22/2014  ? Diabetes mellitus type 2, insulin dependent ([place]) 09/22/2014  ? Pacemaker - MDT ADDR01 Implanted: 05/31/08 Serial# [id number] 09/22/2014  ? History of CHF (congestive heart failure) 09/22/2014  ? Acute renal failure ([place]) 09/22/2014  ? Gout 09/22/2014  ? Abnormal EKG   ? ? ?REFERRING DIAG: gait and mobility abnormalities, decreased balance ? ?THERAPY DIAG:  ?Difficulty in walking, not elsewhere classified ? ?Imbalance ? ?Muscle weakness (generalized) ? ?PERTINENT HISTORY: Large medical history (see above list) including HTN, cardiac conditions, DM ? ?PRECAUTIONS: fall ? ?SUBJECTIVE: pt admits to not doing her HEP.  States she misplaced it and just found it last night.  Pain 6/10 in hips and  down front of Rt LE.  Reports last fall 2 weeks ago when placing food in her microwave and fell backward. ? ?PAIN:  ?Are you having pain? Yes: NPRS scale: 6/10 ?Pain location: hips and anterior Rt LE ?Pain description: burning, aching ?Aggravating factors: walking ?Relieving factors: pain medication ? ?OBJECTIVE:  ?  ?TODAYS TREATMENT: ? HEP review including: ?  Seated heel/toe raises, marching and hip abduction 10X each ? Seated: sit to stands elevated surface no UE assist 5X ?  Long arc quad 10X3" holds ?Supine:  decompression exercises 1-4 5X3" each ? Bridge  with minimal ROM, more glute squeeze 10X3" ? ?  ?PATIENT EDUCATION: ?Education details: goal review, HEP review, importance of HEP compliance,  safety ?Person educated: Patient ?Education method: Explanation, Demonstration, and Handouts ?Education comprehension: verbalized understanding ?  ?  ?HOME EXERCISE PROGRAM: ?  ?At evaluation:  Access Code: [email or web address] ?URL: [email or web address] ?Date: 07/30/2021 ?Prepared by: Jerilynn Som ? Exercises ?- Seated Heel Toe Raises  - 2 x daily - 7 x weekly - 1-2 sets - 10 reps ?- Seated March  - 2 x daily - 7 x weekly - 1-2 sets - 10 reps ?- Seated Hip Abduction  - 2 x daily - 7 x weekly - 1-2 sets - 10 reps ?  ?  ?  ?  ?GOALS: ?Goals reviewed with patient? yes ?  ?SHORT TERM GOALS: Target date: 08/28/2021 ?  ?Patient will be independent with HEP for ability to self progress with safety awareness to decrease fall risk.  ?Baseline: 07/30/21 - initiated today ?Goal status: ONGOING ?  ?2.  Patient will be able to demonstrate improved upright posture with minimal rounded shoulders and erect spine for improved center of balance alignment.  ?Baseline: 07/30/21 - flexed posture in standing overall ?Goal status:  ONGOING ?  ?LONG TERM GOALS: Target date: 09/25/2021 ?  ?Patient will be able to demonstrate overall gross B LE strength to at least 4/5, especially in knee extension.  ?Baseline: 07/30/21 - 3+/5 ?Goal status: ONGOING ?  ?2.  Patient will be able to preform TUG testing in less than 45 seconds for good improved and working toward decreased fall risk.  ?Baseline: 07/30/21 - 1 min 16 seconds ?Goal status: ONGOING ?  ?3.  Patient will be able to report a decrease in fall over 2 months of treatments to no falls for improved safety.  ?Baseline: Patient reports consistent falling each month ?Goal status: ONGOING ?  ?  ?ASSESSMENT: ?  ?CLINICAL IMPRESSION: ?Reviewed goals and POC moving forward.  Pt only able to recall heel and toeraises given for her at evaluation for HEP.  Pt  able to demonstrate remaining exercises following instruction.  Added LAQ and standing without UE from elevated height.  Pt was unable to do this without UE from standard chair height.  Began decompression to help reduce lumbar pain.  Overall slow mobilizing and transitioning from supine/sit/standing.  Pt will continue to benefit from skilled therapy to improve upon deficits and help reduce lumbar/LE pain.    ?  ?  ?OBJECTIVE IMPAIRMENTS Abnormal gait, decreased activity tolerance, decreased balance, decreased coordination, decreased endurance, decreased mobility, difficulty walking, decreased ROM, decreased strength, decreased safety awareness, and impaired flexibility.  ?  ?ACTIVITY LIMITATIONS cleaning, driving, meal prep, and laundry.  ?  ?PLAN: ?PT FREQUENCY: 1-2x/week ?  ?PT DURATION: 8 weeks ?  ?PLANNED INTERVENTIONS: Therapeutic exercises, Therapeutic activity, Neuromuscular re-education, Balance training, Gait training, Patient/Family education, Joint mobilization, Stair training, and Vestibular training ?  ?  PLAN FOR NEXT SESSION: continue to improve strength and balance.  Next session begin static standing balance activities.  Incorporate fall prevention activities as able. ?  ? ? ? ?Megan Walsh Sula Soda, PTA/CLT, WTA ?[phone] ? ?Roseanne Reno B, PTA ?08/13/2021, 12:21 PM ? ?   ?

## 2021-08-20 ENCOUNTER — Encounter (HOSPITAL_COMMUNITY): Payer: Medicare Other | Admitting: Physical Therapy

## 2021-08-20 DIAGNOSIS — Z961 Presence of intraocular lens: Secondary | ICD-10-CM | POA: Diagnosis not present

## 2021-08-20 DIAGNOSIS — H26491 Other secondary cataract, right eye: Secondary | ICD-10-CM | POA: Diagnosis not present

## 2021-08-25 ENCOUNTER — Encounter (HOSPITAL_COMMUNITY): Payer: Self-pay | Admitting: Physical Therapy

## 2021-08-25 ENCOUNTER — Ambulatory Visit (HOSPITAL_COMMUNITY): Payer: Medicare Other | Admitting: Physical Therapy

## 2021-08-25 DIAGNOSIS — R262 Difficulty in walking, not elsewhere classified: Secondary | ICD-10-CM | POA: Diagnosis not present

## 2021-08-25 DIAGNOSIS — M6281 Muscle weakness (generalized): Secondary | ICD-10-CM | POA: Diagnosis not present

## 2021-08-25 DIAGNOSIS — R2689 Other abnormalities of gait and mobility: Secondary | ICD-10-CM | POA: Diagnosis not present

## 2021-08-25 NOTE — Therapy (Signed)
?OUTPATIENT PHYSICAL THERAPY TREATMENT NOTE ? ? ?Patient Name: Megan Walsh ?MRN: [id number] ?DOB:05/04/1933, 88 y.o., female ?Today's Date: 08/25/2021 ? ?PCP: Monico Blitz, MD ?REFERRING PROVIDER: Phillips Odor, MD  ? ?END OF SESSION:  ? PT End of Session - 08/25/21 1113   ? ? Visit Number 3   ? Number of Visits 16   ? Date for PT Re-Evaluation 08/30/21   ? Authorization Type 1) UHC medicare 2)medicaid   ? Authorization Time Period no auth  required follow medicare guidelines   ? Progress Note Due on Visit 10   ? PT Start Time 1130   ? PT Stop Time 2376   ? PT Time Calculation (min) 45 min   ? Activity Tolerance Patient tolerated treatment well   ? Behavior During Therapy [facility] for tasks assessed/performed   ? ?  ?  ? ?  ? ? ?Past Medical History:  ?Diagnosis Date  ? Anxiety   ? Arthritis   ? CHF (congestive heart failure) ([place])   ? Coronary artery disease   ? Diabetes mellitus without complication ([place])   ? Dysrhythmia   ? a-fib  ? Fatty tumor   ? History of gout   ? Hypertension   ? Myocardial infarction [facility])   ? Permanent atrial fibrillation ([place])   ? Presence of permanent cardiac pacemaker   ? MDT ADDR01/Implanted: 05/31/08  ? Sleep apnea   ? cannot tolerate CPAP  ? Symptomatic bradycardia   ? ?Past Surgical History:  ?Procedure Laterality Date  ? ABDOMINAL HYSTERECTOMY    ? BROW LIFT Bilateral 03/10/2018  ? Procedure: BLEPHAROPLASTY BILATERAL UPPER EYELIDS;  Surgeon: Baruch Goldmann, MD;  Location: [facility];  Service: Ophthalmology;  Laterality: Bilateral;  ? CATARACT EXTRACTION W/PHACO Left 10/17/2017  ? Procedure: CATARACT EXTRACTION PHACO AND INTRAOCULAR LENS PLACEMENT LEFT EYE;  Surgeon: Tonny Branch, MD;  Location: [facility];  Service: Ophthalmology;  Laterality: Left;  CDE: 11.64  ? CATARACT EXTRACTION W/PHACO Right 10/31/2017  ? Procedure: CATARACT EXTRACTION PHACO AND INTRAOCULAR LENS PLACEMENT RIGHT EYE;  Surgeon: Tonny Branch, MD;  Location: [facility];  Service: Ophthalmology;  Laterality: Right;  CDE: 11.19  ?  fatty tumor removal to left arm Right   ? PACEMAKER GENERATOR CHANGE  05/31/08  ? at [facility]: MDT ADDR01/Implanted: 05/31/08 as gen change by Dr Reinaldo Berber  ? PACEMAKER INSERTION  1992  ? PPM GENERATOR CHANGEOUT N/A 01/06/2021  ? Procedure: PPM GENERATOR CHANGEOUT;  Surgeon: Thompson Grayer, MD;  Location: [place] [facility];  Service: Cardiovascular;  Laterality: N/A;  ? ?Patient Active Problem List  ? Diagnosis Date Noted  ? Tachycardia-bradycardia ([place]) 10/23/2014  ? Midsternal chest pain 09/24/2014  ? Chest pain 09/22/2014  ? CAD (coronary artery disease) 09/22/2014  ? Atrial fibrillation with controlled ventricular response ([place]) 09/22/2014  ? HTN (hypertension) 09/22/2014  ? Diabetes mellitus type 2, insulin dependent ([place]) 09/22/2014  ? Pacemaker - MDT ADDR01 Implanted: 05/31/08 Serial# [id number] 09/22/2014  ? History of CHF (congestive heart failure) 09/22/2014  ? Acute renal failure ([place]) 09/22/2014  ? Gout 09/22/2014  ? Abnormal EKG   ? ? ?REFERRING DIAG: gait and mobility abnormalities, decreased balance ? ?THERAPY DIAG:  ?Difficulty in walking, not elsewhere classified ? ?Imbalance ? ?Muscle weakness (generalized) ? ?PERTINENT HISTORY: Large medical history (see above list) including HTN, cardiac conditions, DM ? ?PRECAUTIONS: fall ? ?SUBJECTIVE: pt comes today with printed HEP to review.  States she is having trouble with them. States she was hurting in her back and  Rt knee and took a tylenol.  Currently her pain is 4/10 ? ?PAIN:  ?Are you having pain? Yes: NPRS scale: 4/10 ?Pain location: lower back and anterior Rt LE ?Pain description: burning, aching ?Aggravating factors: walking ?Relieving factors: pain medication ? ?OBJECTIVE:  ?  ?TODAYS TREATMENT: ? 08/25/21 ? Seated heel/toe raises, marching and hip abduction 10X each (HEP review once again per request) ? Seated: sit to stands elevated surface no UE assist 5X ?  Long arc quad 10X3" holds ?Supine:  decompression exercises 1-4 5X3" each ? Bridge 10X with  improved ROM ?Heelslides 10X each ? ?  ? ?08/22/21 ?HEP review including: ?  Seated heel/toe raises, marching and hip abduction 10X each ? Seated: sit to stands elevated surface no UE assist 5X ?  Long arc quad 10X3" holds ?Supine:  decompression exercises 1-4 5X3" each ? Bridge with minimal ROM, more glute squeeze 10X3" ? ?  ?PATIENT EDUCATION: ?Education details: goal review, HEP review, importance of HEP compliance,  safety ?Person educated: Patient ?Education method: Explanation, Demonstration, and Handouts ?Education comprehension: verbalized understanding ?  ?  ?HOME EXERCISE PROGRAM: ?  ?At evaluation:  Access Code: [email or web address] ?URL: [email or web address] ?Date: 07/30/2021 ?Prepared by: Jerilynn Som ? Exercises ?- Seated Heel Toe Raises  - 2 x daily - 7 x weekly - 1-2 sets - 10 reps ?- Seated March  - 2 x daily - 7 x weekly - 1-2 sets - 10 reps ?- Seated Hip Abduction  - 2 x daily - 7 x weekly - 1-2 sets - 10 reps ?  ?  ?  ?  ?GOALS: ?Goals reviewed with patient? yes ?  ?SHORT TERM GOALS: Target date: 08/28/2021 ?  ?Patient will be independent with HEP for ability to self progress with safety awareness to decrease fall risk.  ?Baseline: 07/30/21 - initiated today ?Goal status: ONGOING ?  ?2.  Patient will be able to demonstrate improved upright posture with minimal rounded shoulders and erect spine for improved center of balance alignment.  ?Baseline: 07/30/21 - flexed posture in standing overall ?Goal status:  ONGOING ?  ?LONG TERM GOALS: Target date: 09/25/2021 ?  ?Patient will be able to demonstrate overall gross B LE strength to at least 4/5, especially in knee extension.  ?Baseline: 07/30/21 - 3+/5 ?Goal status: ONGOING ?  ?2.  Patient will be able to preform TUG testing in less than 45 seconds for good improved and working toward decreased fall risk.  ?Baseline: 07/30/21 - 1 min 16 seconds ?Goal status: ONGOING ?  ?3.  Patient will be able to report a decrease in fall over 2 months of treatments  to no falls for improved safety.  ?Baseline: Patient reports consistent falling each month ?Goal status: ONGOING ?  ?  ?ASSESSMENT: ?  ?CLINICAL IMPRESSION: ?Completed HEP review per request with pt reporting/displaying a better understanding. Provided a folder to keep all HEP handouts in. Pt with difficulty maintaining upright seated posture with exercises due to weakness and "it hurts".  Pt requires max cues and assist to keep count of exercise repetitions.  Continued with decompression to help reduce lumbar pain. Educated in [place] for supine to sit.  Overall slow mobilizing and transitioning from supine/sit/standing.  Pt will continue to benefit from skilled therapy to improve upon deficits and help reduce lumbar/LE pain.    ?  ?  ?OBJECTIVE IMPAIRMENTS Abnormal gait, decreased activity tolerance, decreased balance, decreased coordination, decreased endurance, decreased mobility, difficulty walking, decreased ROM, decreased strength, decreased safety awareness, and impaired  flexibility.  ?  ?ACTIVITY LIMITATIONS cleaning, driving, meal prep, and laundry.  ?  ?PLAN: ?PT FREQUENCY: 1-2x/week ?  ?PT DURATION: 8 weeks ?  ?PLANNED INTERVENTIONS: Therapeutic exercises, Therapeutic activity, Neuromuscular re-education, Balance training, Gait training, Patient/Family education, Joint mobilization, Stair training, and Vestibular training ?  ?PLAN FOR NEXT SESSION: continue to improve strength and balance.  Begin static balance activities as progresses and Incorporate fall prevention activities as able. ?  ? ? ? ?Orpah Hausner Sula Soda, PTA/CLT, WTA ?[phone] ? ?Roseanne Reno B, PTA ?08/25/2021, 12:13 PM ? ?   ?

## 2021-08-27 ENCOUNTER — Encounter (HOSPITAL_COMMUNITY): Payer: Self-pay

## 2021-08-27 ENCOUNTER — Ambulatory Visit (HOSPITAL_COMMUNITY): Payer: Medicare Other

## 2021-08-27 DIAGNOSIS — R2689 Other abnormalities of gait and mobility: Secondary | ICD-10-CM

## 2021-08-27 DIAGNOSIS — M6281 Muscle weakness (generalized): Secondary | ICD-10-CM

## 2021-08-27 DIAGNOSIS — R262 Difficulty in walking, not elsewhere classified: Secondary | ICD-10-CM | POA: Diagnosis not present

## 2021-08-27 NOTE — Therapy (Signed)
?OUTPATIENT PHYSICAL THERAPY TREATMENT NOTE ? ? ?Patient Name: Megan Walsh ?MRN: [id number] ?DOB:Oct 27, 1932, 88 y.o., female ?Today's Date: 08/27/2021 ? ?PCP: Monico Blitz, MD ?REFERRING PROVIDER: Phillips Odor, MD  ? ?END OF SESSION:  ? PT End of Session - 08/27/21 1436   ? ? Visit Number 4   ? Number of Visits 16   ? Date for PT Re-Evaluation 08/30/21   ? Authorization Type 1) UHC medicare 2)medicaid   ? Authorization Time Period no auth  required follow medicare guidelines   ? Progress Note Due on Visit 10   ? PT Start Time 1315   ? PT Stop Time 3295   ? PT Time Calculation (min) 30 min   ? Activity Tolerance Patient tolerated treatment well   ? Behavior During Therapy [facility] for tasks assessed/performed   ? ?  ?  ? ?  ? ? ? ?Past Medical History:  ?Diagnosis Date  ? Anxiety   ? Arthritis   ? CHF (congestive heart failure) ([place])   ? Coronary artery disease   ? Diabetes mellitus without complication ([place])   ? Dysrhythmia   ? a-fib  ? Fatty tumor   ? History of gout   ? Hypertension   ? Myocardial infarction [facility])   ? Permanent atrial fibrillation ([place])   ? Presence of permanent cardiac pacemaker   ? MDT ADDR01/Implanted: 05/31/08  ? Sleep apnea   ? cannot tolerate CPAP  ? Symptomatic bradycardia   ? ?Past Surgical History:  ?Procedure Laterality Date  ? ABDOMINAL HYSTERECTOMY    ? BROW LIFT Bilateral 03/10/2018  ? Procedure: BLEPHAROPLASTY BILATERAL UPPER EYELIDS;  Surgeon: Baruch Goldmann, MD;  Location: [facility];  Service: Ophthalmology;  Laterality: Bilateral;  ? CATARACT EXTRACTION W/PHACO Left 10/17/2017  ? Procedure: CATARACT EXTRACTION PHACO AND INTRAOCULAR LENS PLACEMENT LEFT EYE;  Surgeon: Tonny Branch, MD;  Location: [facility];  Service: Ophthalmology;  Laterality: Left;  CDE: 11.64  ? CATARACT EXTRACTION W/PHACO Right 10/31/2017  ? Procedure: CATARACT EXTRACTION PHACO AND INTRAOCULAR LENS PLACEMENT RIGHT EYE;  Surgeon: Tonny Branch, MD;  Location: [facility];  Service: Ophthalmology;  Laterality: Right;  CDE: 11.19  ?  fatty tumor removal to left arm Right   ? PACEMAKER GENERATOR CHANGE  05/31/08  ? at [facility]: MDT ADDR01/Implanted: 05/31/08 as gen change by Dr Reinaldo Berber  ? PACEMAKER INSERTION  1992  ? PPM GENERATOR CHANGEOUT N/A 01/06/2021  ? Procedure: PPM GENERATOR CHANGEOUT;  Surgeon: Thompson Grayer, MD;  Location: [place] CV LAB;  Service: Cardiovascular;  Laterality: N/A;  ? ?Patient Active Problem List  ? Diagnosis Date Noted  ? Tachycardia-bradycardia ([place]) 10/23/2014  ? Midsternal chest pain 09/24/2014  ? Chest pain 09/22/2014  ? CAD (coronary artery disease) 09/22/2014  ? Atrial fibrillation with controlled ventricular response ([place]) 09/22/2014  ? HTN (hypertension) 09/22/2014  ? Diabetes mellitus type 2, insulin dependent ([place]) 09/22/2014  ? Pacemaker - MDT ADDR01 Implanted: 05/31/08 Serial# [id number] 09/22/2014  ? History of CHF (congestive heart failure) 09/22/2014  ? Acute renal failure ([place]) 09/22/2014  ? Gout 09/22/2014  ? Abnormal EKG   ? ? ?REFERRING DIAG: gait and mobility abnormalities, decreased balance ? ?THERAPY DIAG:  ?Difficulty in walking, not elsewhere classified ? ?Imbalance ? ?Muscle weakness (generalized) ? ?PERTINENT HISTORY: Large medical history (see above list) including HTN, cardiac conditions, DM ? ?PRECAUTIONS: fall ? ?SUBJECTIVE: pt comes today with printed HEP to review.  States she is having trouble with them. States she was hurting in her back  and Rt knee and took a tylenol.  Currently her pain is 4/10 ? ?PAIN:  ?Are you having pain? Yes: NPRS scale: 4/10 ?Pain location: lower back and anterior Rt LE ?Pain description: burning, aching ?Aggravating factors: walking ?Relieving factors: pain medication ? ?OBJECTIVE:  ?  ?TODAYS TREATMENT: ? 08/27/21 ?  Seated heel/toe raises, marching, and hip abduction 10 reps x 2 rounds each  ?  Seated - hip adduction verse pillow 2 sets x 10 reps ?   - LAQ 2 sets x 10 reps x 2 second hold cue for long reach to full extension  ?   - gluteal squeeze set x 10  reps x 2 sets ?  Standing at // bars ?   - postural tall hold x 60 seconds, cue for gluteal activation and quad activation x 2 rounds ?   - hip extension R leg only x 10  ?   - hip marching R leg only x 10  ?   - step forward R hold 60 seconds tall posture cued x 2  ?   - step forward L hold 60 seconds tall posture cued x 2  ?  Sit to stand with 4WW with cue for light hands on chair and gluteal activation for lift x 2 for functional transition practicing ?   ?    ? 08/25/21 ? Seated heel/toe raises, marching and hip abduction 10X each (HEP review once again per request) ? Seated: sit to stands elevated surface no UE assist 5X ?  Long arc quad 10X3" holds ?Supine:  decompression exercises 1-4 5X3" each ? Bridge 10X with improved ROM ?Heelslides 10X each ? ?  ?08/22/21 ?HEP review including: ?  Seated heel/toe raises, marching and hip abduction 10X each ? Seated: sit to stands elevated surface no UE assist 5X ?  Long arc quad 10X3" holds ?Supine:  decompression exercises 1-4 5X3" each ? Bridge with minimal ROM, more glute squeeze 10X3" ? ?  ?PATIENT EDUCATION: ?Education details: goal review, HEP review, importance of HEP compliance,  safety  08/27/21: knee muscle anatomy and hip support ?Person educated: Patient ?Education method: Explanation, Demonstration, and Handouts ?Education comprehension: verbalized understanding ?  ?  ?HOME EXERCISE PROGRAM: ?  ?At evaluation:  Access Code: [email or web address] ?URL: [email or web address] ?Date: 07/30/2021 ?Prepared by: Jerilynn Som ? Exercises ?- Seated Heel Toe Raises  - 2 x daily - 7 x weekly - 1-2 sets - 10 reps ?- Seated March  - 2 x daily - 7 x weekly - 1-2 sets - 10 reps ?- Seated Hip Abduction  - 2 x daily - 7 x weekly - 1-2 sets - 10 reps ? ?08/27/21 ADDED =  ? - Seated Hip Adduction with a Pillow Squeeze  - 2 x daily - 7 x weekly - 2 sets - 10 reps ?- Seated Long Arc Quad  - 2 x daily - 7 x weekly - 2 sets - 10 reps ?  ?  ?GOALS: ?Goals reviewed with patient? yes ?   ?SHORT TERM GOALS: Target date: 08/28/2021 ?  ?Patient will be independent with HEP for ability to self progress with safety awareness to decrease fall risk.  ?Baseline: 07/30/21 - initiated today ?Goal status: ONGOING ?  ?2.  Patient will be able to demonstrate improved upright posture with minimal rounded shoulders and erect spine for improved center of balance alignment.  ?Baseline: 07/30/21 - flexed posture in standing overall ?Goal status:  ONGOING ?  ?LONG TERM GOALS: Target date: 09/25/2021 ?  ?  Patient will be able to demonstrate overall gross B LE strength to at least 4/5, especially in knee extension.  ?Baseline: 07/30/21 - 3+/5 ?Goal status: ONGOING ?  ?2.  Patient will be able to preform TUG testing in less than 45 seconds for good improved and working toward decreased fall risk.  ?Baseline: 07/30/21 - 1 min 16 seconds ?Goal status: ONGOING ?  ?3.  Patient will be able to report a decrease in fall over 2 months of treatments to no falls for improved safety.  ?Baseline: Patient reports consistent falling each month ?Goal status: ONGOING ?  ?  ?ASSESSMENT: ?  ?CLINICAL IMPRESSION: ?Today's session continued to build HEP in sitting to allow for decreased pressure onto irritated left knee for long term compliance.  During standing trial of more posture work without much knee pressure, she did well with minimal sways however needed cuing for proper muscle activation.  Needs more strengthening to gluteals and core to support hip and knee strengthening.   Pt will continue to benefit from skilled therapy to improve upon deficits and help reduce lumbar/LE pain.    ?  ?  ?OBJECTIVE IMPAIRMENTS Abnormal gait, decreased activity tolerance, decreased balance, decreased coordination, decreased endurance, decreased mobility, difficulty walking, decreased ROM, decreased strength, decreased safety awareness, and impaired flexibility.  ?  ?ACTIVITY LIMITATIONS cleaning, driving, meal prep, and laundry.  ?  ?PLAN: ?PT FREQUENCY:  1-2x/week ?  ?PT DURATION: 8 weeks ?  ?PLANNED INTERVENTIONS: Therapeutic exercises, Therapeutic activity, Neuromuscular re-education, Balance training, Gait training, Patient/Family education, Joint m

## 2021-08-31 ENCOUNTER — Ambulatory Visit (HOSPITAL_COMMUNITY): Payer: Medicare Other | Admitting: Physical Therapy

## 2021-08-31 ENCOUNTER — Encounter (HOSPITAL_COMMUNITY): Payer: Self-pay | Admitting: Physical Therapy

## 2021-08-31 DIAGNOSIS — R2689 Other abnormalities of gait and mobility: Secondary | ICD-10-CM | POA: Diagnosis not present

## 2021-08-31 DIAGNOSIS — M6281 Muscle weakness (generalized): Secondary | ICD-10-CM

## 2021-08-31 DIAGNOSIS — R262 Difficulty in walking, not elsewhere classified: Secondary | ICD-10-CM | POA: Diagnosis not present

## 2021-08-31 NOTE — Therapy (Signed)
?OUTPATIENT PHYSICAL THERAPY TREATMENT NOTE ? ? ?Patient Name: Megan Walsh ?MRN: [id number] ?DOB:17-Apr-1933, 88 y.o., female ?Today's Date: 08/31/2021 ? ?PCP: Monico Blitz, MD ?REFERRING PROVIDER: Phillips Odor, MD  ? ?END OF SESSION:  ? PT End of Session - 08/31/21 1319   ? ? Visit Number 5   ? Number of Visits 16   ? Date for PT Re-Evaluation 10/03/21   ? Authorization Type 1) UHC medicare 2)medicaid   ? Authorization Time Period no auth  required follow medicare guidelines   ? Progress Note Due on Visit 10   ? PT Start Time 5465   ? PT Stop Time 1355   ? PT Time Calculation (min) 38 min   ? Activity Tolerance Patient tolerated treatment well   ? Behavior During Therapy [facility] for tasks assessed/performed   ? ?  ?  ? ?  ? ? ? ?Past Medical History:  ?Diagnosis Date  ? Anxiety   ? Arthritis   ? CHF (congestive heart failure) ([place])   ? Coronary artery disease   ? Diabetes mellitus without complication ([place])   ? Dysrhythmia   ? a-fib  ? Fatty tumor   ? History of gout   ? Hypertension   ? Myocardial infarction [facility])   ? Permanent atrial fibrillation ([place])   ? Presence of permanent cardiac pacemaker   ? MDT ADDR01/Implanted: 05/31/08  ? Sleep apnea   ? cannot tolerate CPAP  ? Symptomatic bradycardia   ? ?Past Surgical History:  ?Procedure Laterality Date  ? ABDOMINAL HYSTERECTOMY    ? BROW LIFT Bilateral 03/10/2018  ? Procedure: BLEPHAROPLASTY BILATERAL UPPER EYELIDS;  Surgeon: Baruch Goldmann, MD;  Location: [facility];  Service: Ophthalmology;  Laterality: Bilateral;  ? CATARACT EXTRACTION W/PHACO Left 10/17/2017  ? Procedure: CATARACT EXTRACTION PHACO AND INTRAOCULAR LENS PLACEMENT LEFT EYE;  Surgeon: Tonny Branch, MD;  Location: [facility];  Service: Ophthalmology;  Laterality: Left;  CDE: 11.64  ? CATARACT EXTRACTION W/PHACO Right 10/31/2017  ? Procedure: CATARACT EXTRACTION PHACO AND INTRAOCULAR LENS PLACEMENT RIGHT EYE;  Surgeon: Tonny Branch, MD;  Location: [facility];  Service: Ophthalmology;  Laterality: Right;  CDE: 11.19  ?  fatty tumor removal to left arm Right   ? PACEMAKER GENERATOR CHANGE  05/31/08  ? at [facility]: MDT ADDR01/Implanted: 05/31/08 as gen change by Dr Reinaldo Berber  ? PACEMAKER INSERTION  1992  ? PPM GENERATOR CHANGEOUT N/A 01/06/2021  ? Procedure: PPM GENERATOR CHANGEOUT;  Surgeon: Thompson Grayer, MD;  Location: [place] [facility];  Service: Cardiovascular;  Laterality: N/A;  ? ?Patient Active Problem List  ? Diagnosis Date Noted  ? Tachycardia-bradycardia ([place]) 10/23/2014  ? Midsternal chest pain 09/24/2014  ? Chest pain 09/22/2014  ? CAD (coronary artery disease) 09/22/2014  ? Atrial fibrillation with controlled ventricular response ([place]) 09/22/2014  ? HTN (hypertension) 09/22/2014  ? Diabetes mellitus type 2, insulin dependent ([place]) 09/22/2014  ? Pacemaker - MDT [id number] Implanted: 05/31/08 Serial# [id number] 09/22/2014  ? History of CHF (congestive heart failure) 09/22/2014  ? Acute renal failure ([place]) 09/22/2014  ? Gout 09/22/2014  ? Abnormal EKG   ? ? ?REFERRING DIAG: gait and mobility abnormalities, decreased balance ? ?THERAPY DIAG:  ?Difficulty in walking, not elsewhere classified ? ?Imbalance ? ?Muscle weakness (generalized) ? ?PERTINENT HISTORY: Large medical history (see above list) including HTN, cardiac conditions, DM ? ?PRECAUTIONS: fall ? ?SUBJECTIVE: pt comes today with printed HEP to review.  States she is having trouble with them. States she was hurting in her back  and Rt knee and took a tylenol.  Currently her pain is 4/10 ? ?PAIN:  ?Are you having pain? Yes: NPRS scale: 4/10 ?Pain location: lower back and anterior Rt LE ?Pain description: burning, aching ?Aggravating factors: walking ?Relieving factors: pain medication ? ?OBJECTIVE:  ?  ?TODAYS TREATMENT: ? 08/31/21 ?Nu step Level 2 5 minutes for warm up and conditioning ?HR/TR 1x 15 ?Standing march alternating 2x 10 bilateral  ?Hip abduction 2x 10 bilateral  ?Seated row RTB 2x 15  ?Seated hip abduction isometric 10 x 5 second holds ? ? ?08/27/21 ?  Seated  heel/toe raises, marching, and hip abduction 10 reps x 2 rounds each  ?  Seated - hip adduction verse pillow 2 sets x 10 reps ?   - LAQ 2 sets x 10 reps x 2 second hold cue for long reach to full extension  ?   - gluteal squeeze set x 10 reps x 2 sets ?  Standing at // bars ?   - postural tall hold x 60 seconds, cue for gluteal activation and quad activation x 2 rounds ?   - hip extension R leg only x 10  ?   - hip marching R leg only x 10  ?   - step forward R hold 60 seconds tall posture cued x 2  ?   - step forward L hold 60 seconds tall posture cued x 2  ?  Sit to stand with 4WW with cue for light hands on chair and gluteal activation for lift x 2 for functional transition practicing ?   ?    ? 08/25/21 ? Seated heel/toe raises, marching and hip abduction 10X each (HEP review once again per request) ? Seated: sit to stands elevated surface no UE assist 5X ?  Long arc quad 10X3" holds ?Supine:  decompression exercises 1-4 5X3" each ? Bridge 10X with improved ROM ?Heelslides 10X each ? ?  ?08/22/21 ?HEP review including: ?  Seated heel/toe raises, marching and hip abduction 10X each ? Seated: sit to stands elevated surface no UE assist 5X ?  Long arc quad 10X3" holds ?Supine:  decompression exercises 1-4 5X3" each ? Bridge with minimal ROM, more glute squeeze 10X3" ? ?  ?PATIENT EDUCATION: ?Education details: goal review, HEP review, importance of HEP compliance,  safety  08/27/21: knee muscle anatomy and hip support  4/24 HEP ?Person educated: Patient ?Education method: Explanation, Demonstration, and Handouts ?Education comprehension: verbalized understanding ?  ?  ?HOME EXERCISE PROGRAM: ?  ?At evaluation:  Access Code: [email or web address] ?URL: [email or web address] ?Date: 07/30/2021 ?Prepared by: Jerilynn Som ? Exercises ?- Seated Heel Toe Raises  - 2 x daily - 7 x weekly - 1-2 sets - 10 reps ?- Seated March  - 2 x daily - 7 x weekly - 1-2 sets - 10 reps ?- Seated Hip Abduction  - 2 x daily - 7 x weekly -  1-2 sets - 10 reps ? ?08/27/21 ADDED =  ? - Seated Hip Adduction with a Pillow Squeeze  - 2 x daily - 7 x weekly - 2 sets - 10 reps ?- Seated Long Arc Quad  - 2 x daily - 7 x weekly - 2 sets - 10 reps ?  ?  ?GOALS: ?Goals reviewed with patient? yes ?  ?SHORT TERM GOALS: Target date: 08/28/2021 ?  ?Patient will be independent with HEP for ability to self progress with safety awareness to decrease fall risk.  ?Baseline: 07/30/21 - initiated today ?Goal status: ONGOING ?  ?  2.  Patient will be able to demonstrate improved upright posture with minimal rounded shoulders and erect spine for improved center of balance alignment.  ?Baseline: 07/30/21 - flexed posture in standing overall ?Goal status:  ONGOING ?  ?LONG TERM GOALS: Target date: 09/25/2021 ?  ?Patient will be able to demonstrate overall gross B LE strength to at least 4/5, especially in knee extension.  ?Baseline: 07/30/21 - 3+/5 ?Goal status: ONGOING ?  ?2.  Patient will be able to preform TUG testing in less than 45 seconds for good improved and working toward decreased fall risk.  ?Baseline: 07/30/21 - 1 min 16 seconds ?Goal status: ONGOING ?  ?3.  Patient will be able to report a decrease in fall over 2 months of treatments to no falls for improved safety.  ?Baseline: Patient reports consistent falling each month ?Goal status: ONGOING ?  ?  ?ASSESSMENT: ?  ?CLINICAL IMPRESSION: ?Continued with LE strengthening and balance training. Patient without knee pain today with weightbearing. She requires intermittent rest breaks for fatigue and cueing for rep count. Patient with moderate to high fatigue at end of session. Patient will continue to benefit from physical therapy in order to improve function and reduce impairment. ? ?  ?  ?OBJECTIVE IMPAIRMENTS Abnormal gait, decreased activity tolerance, decreased balance, decreased coordination, decreased endurance, decreased mobility, difficulty walking, decreased ROM, decreased strength, decreased safety awareness, and  impaired flexibility.  ?  ?ACTIVITY LIMITATIONS cleaning, driving, meal prep, and laundry.  ?  ?PLAN: ?PT FREQUENCY: 1-2x/week ?  ?PT DURATION: 8 weeks ?  ?PLANNED INTERVENTIONS: Therapeutic exercises, Therapeut

## 2021-09-02 ENCOUNTER — Encounter (HOSPITAL_COMMUNITY): Payer: Medicare Other

## 2021-09-06 DIAGNOSIS — I10 Essential (primary) hypertension: Secondary | ICD-10-CM | POA: Diagnosis not present

## 2021-09-06 DIAGNOSIS — E7800 Pure hypercholesterolemia, unspecified: Secondary | ICD-10-CM | POA: Diagnosis not present

## 2021-09-07 ENCOUNTER — Other Ambulatory Visit: Payer: Self-pay | Admitting: Cardiology

## 2021-09-07 DIAGNOSIS — H5213 Myopia, bilateral: Secondary | ICD-10-CM | POA: Diagnosis not present

## 2021-09-07 DIAGNOSIS — Z961 Presence of intraocular lens: Secondary | ICD-10-CM | POA: Diagnosis not present

## 2021-09-07 NOTE — Telephone Encounter (Signed)
Prescription refill request for Eliquis received. ?Indication: Atrial fib ?Last office visit: 05/15/21  Clearnce Hasten MD ?Scr: 1.08 on 04/17/21 ?Age: 88 ?Weight: 62.6kg ? ?Continue Eliquis 2.'5mg'$  twice daily per MD. Refill approved. ? ?

## 2021-09-08 ENCOUNTER — Ambulatory Visit (HOSPITAL_COMMUNITY): Payer: Medicare Other | Attending: Neurology

## 2021-09-08 DIAGNOSIS — R262 Difficulty in walking, not elsewhere classified: Secondary | ICD-10-CM | POA: Insufficient documentation

## 2021-09-08 DIAGNOSIS — R2689 Other abnormalities of gait and mobility: Secondary | ICD-10-CM | POA: Diagnosis not present

## 2021-09-08 DIAGNOSIS — M6281 Muscle weakness (generalized): Secondary | ICD-10-CM | POA: Insufficient documentation

## 2021-09-08 NOTE — Therapy (Signed)
OUTPATIENT PHYSICAL THERAPY TREATMENT NOTE   Patient Name: Megan Walsh MRN: [id number] DOB:1932-10-22, 88 y.o., female Today's Date: 09/08/2021  PCP: Kirstie Peri, MD REFERRING PROVIDER: Beryle Beams, MD   END OF SESSION:   PT End of Session - 09/08/21 1258     Visit Number 6    Number of Visits 16    Date for PT Re-Evaluation 10/03/21    Authorization Type 1) UHC medicare 2)medicaid    Authorization Time Period no auth  required follow medicare guidelines    Progress Note Due on Visit 10    PT Start Time 1300    PT Stop Time 1340    PT Time Calculation (min) 40 min    Activity Tolerance Patient tolerated treatment well    Behavior During Therapy WFL for tasks assessed/performed               Past Medical History:  Diagnosis Date   Anxiety    Arthritis    CHF (congestive heart failure) ([facility])    Coronary artery disease    Diabetes mellitus without complication ([facility])    Dysrhythmia    a-fib   Fatty tumor    History of gout    Hypertension    Myocardial infarction [facility])    Permanent atrial fibrillation ([facility])    Presence of permanent cardiac pacemaker    MDT ADDR01/Implanted: 05/31/08   Sleep apnea    cannot tolerate CPAP   Symptomatic bradycardia    Past Surgical History:  Procedure Laterality Date   ABDOMINAL HYSTERECTOMY     BROW LIFT Bilateral 03/10/2018   Procedure: BLEPHAROPLASTY BILATERAL UPPER EYELIDS;  Surgeon: Fabio Pierce, MD;  Location: [facility];  Service: Ophthalmology;  Laterality: Bilateral;   CATARACT EXTRACTION W/PHACO Left 10/17/2017   Procedure: CATARACT EXTRACTION PHACO AND INTRAOCULAR LENS PLACEMENT LEFT EYE;  Surgeon: Gemma Payor, MD;  Location: [facility];  Service: Ophthalmology;  Laterality: Left;  CDE: 11.64   CATARACT EXTRACTION W/PHACO Right 10/31/2017   Procedure: CATARACT EXTRACTION PHACO AND INTRAOCULAR LENS PLACEMENT RIGHT EYE;  Surgeon: Gemma Payor, MD;  Location: [facility];  Service: Ophthalmology;  Laterality: Right;  CDE: 11.19    fatty tumor removal to left arm Right    PACEMAKER GENERATOR CHANGE  05/31/08   at [facility]: MDT ADDR01/Implanted: 05/31/08 as gen change by Dr Clemens Catholic   PACEMAKER INSERTION  1992   PPM GENERATOR CHANGEOUT N/A 01/06/2021   Procedure: PPM GENERATOR CHANGEOUT;  Surgeon: Hillis Range, MD;  [facility]: [facility];  Service: Cardiovascular;  Laterality: N/A;   Patient Active Problem List   Diagnosis Date Noted   Tachycardia-bradycardia ([facility]) 10/23/2014   Midsternal chest pain 09/24/2014   Chest pain 09/22/2014   CAD (coronary artery disease) 09/22/2014   Atrial fibrillation with controlled ventricular response ([facility]) 09/22/2014   HTN (hypertension) 09/22/2014   Diabetes mellitus type 2, insulin dependent ([facility]) 09/22/2014   Pacemaker - MDT ADDR01 Implanted: 05/31/08 Serial# [id number] 09/22/2014   History of CHF (congestive heart failure) 09/22/2014   Acute renal failure ([facility]) 09/22/2014   Gout 09/22/2014   Abnormal EKG     REFERRING DIAG: gait and mobility abnormalities, decreased balance  THERAPY DIAG:  Difficulty in walking, not elsewhere classified  Imbalance  Muscle weakness (generalized)  PERTINENT HISTORY: Large medical history (see above list) including HTN, cardiac conditions, DM  PRECAUTIONS: fall  SUBJECTIVE: Patient reports no pain on arrival had some pain this morning but put a patch on her back and that took care  of it. She states she did have a fall into a kitchen chair and it hurt her back some.   PAIN:  Are you having pain? Yes: NPRS scale: 4/10 Pain location: lower back and anterior Rt LE Pain description: burning, aching Aggravating factors: walking Relieving factors: pain medication  OBJECTIVE:    TODAYS TREATMENT: 09/08/21 Nustep level 5 x 5'  Standing:  Heel raises/toe raises x 15 each 6" step up x 6 each side; 2 hand on // bars 2# hip ABD, marching and hip ext x 10 reps each leg Tandem stance for balance 2 x 10" each CGA  Sit to stand 2 x 5;  one set no UE assist needs min A, one set with one UE assist with CGA     08/31/21 Nu step Level 2 5 minutes for warm up and conditioning HR/TR 1x 15 Standing march alternating 2x 10 bilateral  Hip abduction 2x 10 bilateral  Seated row RTB 2x 15  Seated hip abduction isometric 10 x 5 second holds   08/27/21   Seated heel/toe raises, marching, and hip abduction 10 reps x 2 rounds each    Seated - hip adduction verse pillow 2 sets x 10 reps    - LAQ 2 sets x 10 reps x 2 second hold cue for long reach to full extension     - gluteal squeeze set x 10 reps x 2 sets   Standing at // bars    - postural tall hold x 60 seconds, cue for gluteal activation and quad activation x 2 rounds    - hip extension R leg only x 10     - hip marching R leg only x 10     - step forward R hold 60 seconds tall posture cued x 2     - step forward L hold 60 seconds tall posture cued x 2    Sit to stand with 4WW with cue for light hands on chair and gluteal activation for lift x 2 for functional transition practicing         08/25/21  Seated heel/toe raises, marching and hip abduction 10X each (HEP review once again per request)  Seated: sit to stands elevated surface no UE assist 5X   Long arc quad 10X3" holds Supine:  decompression exercises 1-4 5X3" each  Bridge 10X with improved ROM Heelslides 10X each    08/22/21 HEP review including:   Seated heel/toe raises, marching and hip abduction 10X each  Seated: sit to stands elevated surface no UE assist 5X   Long arc quad 10X3" holds Supine:  decompression exercises 1-4 5X3" each  Bridge with minimal ROM, more glute squeeze 10X3"    PATIENT EDUCATION: Education details: goal review, HEP review, importance of HEP compliance,  safety  08/27/21: knee muscle anatomy and hip support  4/24 HEP Person educated: Patient Education method: Explanation, Demonstration, and Handouts Education comprehension: verbalized understanding     HOME EXERCISE PROGRAM:    At evaluation:  Access Code: [email or web address] URL: [email or web address] Date: 07/30/2021 Prepared by: Lonzo Cloud  Exercises - Seated Heel Toe Raises  - 2 x daily - 7 x weekly - 1-2 sets - 10 reps - Seated March  - 2 x daily - 7 x weekly - 1-2 sets - 10 reps - Seated Hip Abduction  - 2 x daily - 7 x weekly - 1-2 sets - 10 reps  08/27/21 ADDED =   - Seated Hip Adduction with a Pillow Squeeze  -  2 x daily - 7 x weekly - 2 sets - 10 reps - Seated Long Arc Quad  - 2 x daily - 7 x weekly - 2 sets - 10 reps     GOALS: Goals reviewed with patient? yes   SHORT TERM GOALS: Target date: 08/28/2021   Patient will be independent with HEP for ability to self progress with safety awareness to decrease fall risk.  Baseline: 07/30/21 - initiated today Goal status: ONGOING   2.  Patient will be able to demonstrate improved upright posture with minimal rounded shoulders and erect spine for improved center of balance alignment.  Baseline: 07/30/21 - flexed posture in standing overall Goal status:  ONGOING   LONG TERM GOALS: Target date: 09/25/2021   Patient will be able to demonstrate overall gross B LE strength to at least 4/5, especially in knee extension.  Baseline: 07/30/21 - 3+/5 Goal status: ONGOING   2.  Patient will be able to preform TUG testing in less than 45 seconds for good improved and working toward decreased fall risk.  Baseline: 07/30/21 - 1 min 16 seconds Goal status: ONGOING   3.  Patient will be able to report a decrease in fall over 2 months of treatments to no falls for improved safety.  Baseline: Patient reports consistent falling each month Goal status: ONGOING     ASSESSMENT:   CLINICAL IMPRESSION: Today's session continued to work on strengthening and balance; added step ups to work on balance; patient uses 2 Ue's to assist but is able to use 1 UE a couple of reps; added 2# to hip strengthening exercise to increase her strength. Trial of sit to stand from  chair without UE use but she needs min A to complete.  Tends to lean backwards on standing.  Added tandem stance to work on balance.  She is fearful of falling and needs CGA for safety. Patient will continue to benefit from physical therapy in order to improve function and reduce impairment.      OBJECTIVE IMPAIRMENTS Abnormal gait, decreased activity tolerance, decreased balance, decreased coordination, decreased endurance, decreased mobility, difficulty walking, decreased ROM, decreased strength, decreased safety awareness, and impaired flexibility.    ACTIVITY LIMITATIONS cleaning, driving, meal prep, and laundry.    PLAN: PT FREQUENCY: 1-2x/week   PT DURATION: 8 weeks   PLANNED INTERVENTIONS: Therapeutic exercises, Therapeutic activity, Neuromuscular re-education, Balance training, Gait training, Patient/Family education, Joint mobilization, Stair training, and Vestibular training   PLAN FOR NEXT SESSION: continue to improve strength and balance.  Begin static balance activities as progresses and Incorporate fall prevention activities as able.  Add more hip strengthening and progress standing balance.        1:39 PM, 09/08/21 Daoud Lobue Small Chanci Ojala MPT [place] [place] [phone]

## 2021-09-10 ENCOUNTER — Ambulatory Visit (HOSPITAL_COMMUNITY): Payer: Medicare Other

## 2021-09-10 DIAGNOSIS — M6281 Muscle weakness (generalized): Secondary | ICD-10-CM | POA: Diagnosis not present

## 2021-09-10 DIAGNOSIS — R2689 Other abnormalities of gait and mobility: Secondary | ICD-10-CM | POA: Diagnosis not present

## 2021-09-10 DIAGNOSIS — R262 Difficulty in walking, not elsewhere classified: Secondary | ICD-10-CM

## 2021-09-10 NOTE — Therapy (Signed)
OUTPATIENT PHYSICAL THERAPY TREATMENT NOTE   Patient Name: Megan Walsh MRN: [id number] DOB:[birth year], 88 y.o., female Today's Date: 09/10/2021  PCP: Kirstie Peri, MD REFERRING PROVIDER: Beryle Beams, MD   END OF SESSION:   PT End of Session - 09/10/21 1350     Visit Number 7    Number of Visits 16    Date for PT Re-Evaluation 10/03/21    Authorization Type 1) UHC medicare 2)medicaid    Authorization Time Period no auth  required follow medicare guidelines    Progress Note Due on Visit 10    PT Start Time 1350    PT Stop Time 1435    PT Time Calculation (min) 45 min    Activity Tolerance Patient tolerated treatment well    Behavior During Therapy WFL for tasks assessed/performed                Past Medical History:  Diagnosis Date   Anxiety    Arthritis    CHF (congestive heart failure) (HCC)    Coronary artery disease    Diabetes mellitus without complication (HCC)    Dysrhythmia    a-fib   Fatty tumor    History of gout    Hypertension    Myocardial infarction [facility])    Permanent atrial fibrillation (HCC)    Presence of permanent cardiac pacemaker    MDT ADDR01/Implanted: 05/31/08   Sleep apnea    cannot tolerate CPAP   Symptomatic bradycardia    Past Surgical History:  Procedure Laterality Date   ABDOMINAL HYSTERECTOMY     BROW LIFT Bilateral 03/10/2018   Procedure: BLEPHAROPLASTY BILATERAL UPPER EYELIDS;  Surgeon: Fabio Pierce, MD;  Location: [facility];  Service: Ophthalmology;  Laterality: Bilateral;   CATARACT EXTRACTION W/PHACO Left 10/17/2017   Procedure: CATARACT EXTRACTION PHACO AND INTRAOCULAR LENS PLACEMENT LEFT EYE;  Surgeon: Gemma Payor, MD;  Location: [facility];  Service: Ophthalmology;  Laterality: Left;  CDE: 11.64   CATARACT EXTRACTION W/PHACO Right 10/31/2017   Procedure: CATARACT EXTRACTION PHACO AND INTRAOCULAR LENS PLACEMENT RIGHT EYE;  Surgeon: Gemma Payor, MD;  Location: [facility];  Service: Ophthalmology;  Laterality: Right;  CDE: 11.19    fatty tumor removal to left arm Right    PACEMAKER GENERATOR CHANGE  05/31/08   at [facility]: MDT ADDR01/Implanted: 05/31/08 as gen change by Dr Clemens Catholic   PACEMAKER INSERTION  1992   PPM GENERATOR CHANGEOUT N/A 01/06/2021   Procedure: PPM GENERATOR CHANGEOUT;  Surgeon: Hillis Range, MD;  [facility];  Service: Cardiovascular;  Laterality: N/A;   Patient Active Problem List   Diagnosis Date Noted   Tachycardia-bradycardia (HCC) 10/23/2014   Midsternal chest pain 09/24/2014   Chest pain 09/22/2014   CAD (coronary artery disease) 09/22/2014   Atrial fibrillation with controlled ventricular response (HCC) 09/22/2014   HTN (hypertension) 09/22/2014   Diabetes mellitus type 2, insulin dependent (HCC) 09/22/2014   Pacemaker - MDT [id number] Implanted: 05/31/08 Serial# [id number] 09/22/2014   History of CHF (congestive heart failure) 09/22/2014   Acute renal failure (HCC) 09/22/2014   Gout 09/22/2014   Abnormal EKG     REFERRING DIAG: gait and mobility abnormalities, decreased balance  THERAPY DIAG:  Difficulty in walking, not elsewhere classified  Imbalance  Muscle weakness (generalized)  PERTINENT HISTORY: Large medical history (see above list) including HTN, cardiac conditions, DM  PRECAUTIONS: fall  SUBJECTIVE: Patient reports having pain earlier in the day but has since taken medication and does not currently have any pain. Pt  reports that causes of fall in the past have been floaters affecting her vision. Pt reports that she has seen her MD but forgot to mention having floaters.  PAIN:  Are you having pain? Yes: NPRS scale: 0/10 Pain location: lower back and anterior Rt LE Pain description: burning, aching Aggravating factors: walking Relieving factors: pain medication  OBJECTIVE:    TODAYS TREATMENT: 09/10/21 Nustep level 1 x5 min at seat 6 at DS  Heel raises 2x15 in // bars, 2 finger hold B/L UE-> UNI UE at CGA Toe raises x15 uni 2 finger hold,  CGA Standing Marches x15 uni 2 finger hold, CGA Step ups to 6 inch step x10 B/L, uni UE support, CGA Tandem stance holds with 2 finger hold  then no UE support. Progressed onto airex beam, performed B/L at [facility]. Total of 6x20 sec Bipedal stance on black foam pad with alternating UE flexion x23mins, CGA Mini squats on black foam pad x8 reps at CG Hip abd with 3# ankle weights x10 performed B/L, B/L UE support, CGA  Sit to stand no UE support 2x5, at CG/mina     09/08/21 Nustep level 5 x 5'  Standing:  Heel raises/toe raises x 15 each 6" step up x 6 each side; 2 hand on // bars 2# hip ABD, marching and hip ext x 10 reps each leg Tandem stance for balance 2 x 10" each CGA  Sit to stand 2 x 5; one set no UE assist needs min A, one set with one UE assist with CGA     08/31/21 Nu step Level 2 5 minutes for warm up and conditioning HR/TR 1x 15 Standing march alternating 2x 10 bilateral  Hip abduction 2x 10 bilateral  Seated row RTB 2x 15  Seated hip abduction isometric 10 x 5 second holds   08/27/21   Seated heel/toe raises, marching, and hip abduction 10 reps x 2 rounds each    Seated - hip adduction verse pillow 2 sets x 10 reps    - LAQ 2 sets x 10 reps x 2 second hold cue for long reach to full extension     - gluteal squeeze set x 10 reps x 2 sets   Standing at // bars    - postural tall hold x 60 seconds, cue for gluteal activation and quad activation x 2 rounds    - hip extension R leg only x 10     - hip marching R leg only x 10     - step forward R hold 60 seconds tall posture cued x 2     - step forward L hold 60 seconds tall posture cued x 2    Sit to stand with 4WW with cue for light hands on chair and gluteal activation for lift x 2 for functional transition practicing         08/25/21  Seated heel/toe raises, marching and hip abduction 10X each (HEP review once again per request)  Seated: sit to stands elevated surface no UE assist 5X   Long arc quad 10X3"  holds Supine:  decompression exercises 1-4 5X3" each  Bridge 10X with improved ROM Heelslides 10X each    08/22/21 HEP review including:   Seated heel/toe raises, marching and hip abduction 10X each  Seated: sit to stands elevated surface no UE assist 5X   Long arc quad 10X3" holds Supine:  decompression exercises 1-4 5X3" each  Bridge with minimal ROM, more glute squeeze 10X3"  PATIENT EDUCATION: Education details: educated to have assist if performing sit to stands with UE support at home. HEP review, importance of HEP compliance,  safety  08/27/21: knee muscle anatomy and hip support  4/24 HEP Person educated: Patient Education method: Explanation, Demonstration, and Handouts Education comprehension: verbalized understanding     HOME EXERCISE PROGRAM: Access Code: [email or web address] URL: [email or web address] Date: 09/10/2021 Prepared by: [facility]  Exercises - Sit to Stand Without Arm Support  - 2 x daily - 7 x weekly - 2 sets - 5 reps    At evaluation:  Access Code: [email or web address] URL: [email or web address] Date: 07/30/2021 Prepared by: Lonzo Cloud  Exercises - Seated Heel Toe Raises  - 2 x daily - 7 x weekly - 1-2 sets - 10 reps - Seated March  - 2 x daily - 7 x weekly - 1-2 sets - 10 reps - Seated Hip Abduction  - 2 x daily - 7 x weekly - 1-2 sets - 10 reps  08/27/21 ADDED =   - Seated Hip Adduction with a Pillow Squeeze  - 2 x daily - 7 x weekly - 2 sets - 10 reps - Seated Long Arc Quad  - 2 x daily - 7 x weekly - 2 sets - 10 reps     GOALS: Goals reviewed with patient? yes   SHORT TERM GOALS: Target date: 08/28/2021   Patient will be independent with HEP for ability to self progress with safety awareness to decrease fall risk.  Baseline: 07/30/21 - initiated today Goal status: ONGOING   2.  Patient will be able to demonstrate improved upright posture with minimal rounded shoulders and erect spine for improved center of balance alignment.   Baseline: 07/30/21 - flexed posture in standing overall Goal status:  ONGOING   LONG TERM GOALS: Target date: 09/25/2021   Patient will be able to demonstrate overall gross B LE strength to at least 4/5, especially in knee extension.  Baseline: 07/30/21 - 3+/5 Goal status: ONGOING   2.  Patient will be able to preform TUG testing in less than 45 seconds for good improved and working toward decreased fall risk.  Baseline: 07/30/21 - 1 min 16 seconds Goal status: ONGOING   3.  Patient will be able to report a decrease in fall over 2 months of treatments to no falls for improved safety.  Baseline: Patient reports consistent falling each month Goal status: ONGOING     ASSESSMENT:   CLINICAL IMPRESSION: Treatment session continued to incorporate strength and balance exercises. Patient tolerates session well with intermittent short seated rest breaks. Patient progresses on exercises to using a single 2 finger hold for a majority of exercises completed in // bars. Pt with increased trunk motion with performing standing hip abd lifts. Pt retropulsed on sit to stand without UE. Pt continues to feel like she could fall when she gets out of bed in the morning. Patient will continue to benefit from skilled PT services to improve functional mobility and reduce impairment.      OBJECTIVE IMPAIRMENTS Abnormal gait, decreased activity tolerance, decreased balance, decreased coordination, decreased endurance, decreased mobility, difficulty walking, decreased ROM, decreased strength, decreased safety awareness, and impaired flexibility.    ACTIVITY LIMITATIONS cleaning, driving, meal prep, and laundry.    PLAN: PT FREQUENCY: 1-2x/week   PT DURATION: 8 weeks   PLANNED INTERVENTIONS: Therapeutic exercises, Therapeutic activity, Neuromuscular re-education, Balance training, Gait training, Patient/Family education, Joint mobilization, Stair training, and Vestibular training   PLAN FOR NEXT SESSION:  continue to improve strength and balance.   Incorporate fall prevention activities as able.  Add more hip strengthening and progress standing balance.        2:36 PM, 09/10/21 Stormee Duda Small Shakeel Disney MPT [facility] [place] [phone]

## 2021-09-15 ENCOUNTER — Ambulatory Visit (HOSPITAL_COMMUNITY): Payer: Medicare Other | Admitting: Physical Therapy

## 2021-09-15 ENCOUNTER — Encounter (HOSPITAL_COMMUNITY): Payer: Self-pay | Admitting: Physical Therapy

## 2021-09-15 DIAGNOSIS — R2689 Other abnormalities of gait and mobility: Secondary | ICD-10-CM | POA: Diagnosis not present

## 2021-09-15 DIAGNOSIS — M6281 Muscle weakness (generalized): Secondary | ICD-10-CM | POA: Diagnosis not present

## 2021-09-15 DIAGNOSIS — R262 Difficulty in walking, not elsewhere classified: Secondary | ICD-10-CM

## 2021-09-15 NOTE — Therapy (Signed)
?OUTPATIENT PHYSICAL THERAPY TREATMENT NOTE ? ? ?Patient Name: Megan Walsh ?MRN: [id number] ?DOB:March 22, 1933, 88 y.o., female ?Today's Date: 09/15/2021 ? ?PCP: Monico Blitz, MD ?REFERRING PROVIDER: Phillips Odor, MD  ? ?END OF SESSION:  ? PT End of Session - 09/15/21 1312   ? ? Visit Number 8   ? Number of Visits 16   ? Date for PT Re-Evaluation 10/03/21   ? Authorization Type 1) UHC medicare 2)medicaid   ? Authorization Time Period no auth  required follow medicare guidelines   ? Progress Note Due on Visit 10   ? PT Start Time 1315   ? PT Stop Time 1355   ? PT Time Calculation (min) 40 min   ? Activity Tolerance Patient tolerated treatment well   ? Behavior During Therapy [facility] for tasks assessed/performed   ? ?  ?  ? ?  ? ? ? ? ? ?Past Medical History:  ?Diagnosis Date  ? Anxiety   ? Arthritis   ? CHF (congestive heart failure) ([place])   ? Coronary artery disease   ? Diabetes mellitus without complication ([place])   ? Dysrhythmia   ? a-fib  ? Fatty tumor   ? History of gout   ? Hypertension   ? Myocardial infarction [facility])   ? Permanent atrial fibrillation ([place])   ? Presence of permanent cardiac pacemaker   ? MDT ADDR01/Implanted: 05/31/08  ? Sleep apnea   ? cannot tolerate CPAP  ? Symptomatic bradycardia   ? ?Past Surgical History:  ?Procedure Laterality Date  ? ABDOMINAL HYSTERECTOMY    ? BROW LIFT Bilateral 03/10/2018  ? Procedure: BLEPHAROPLASTY BILATERAL UPPER EYELIDS;  Surgeon: Baruch Goldmann, MD;  Location: [facility];  Service: Ophthalmology;  Laterality: Bilateral;  ? CATARACT EXTRACTION W/PHACO Left 10/17/2017  ? Procedure: CATARACT EXTRACTION PHACO AND INTRAOCULAR LENS PLACEMENT LEFT EYE;  Surgeon: Tonny Branch, MD;  Location: [facility];  Service: Ophthalmology;  Laterality: Left;  CDE: 11.64  ? CATARACT EXTRACTION W/PHACO Right 10/31/2017  ? Procedure: CATARACT EXTRACTION PHACO AND INTRAOCULAR LENS PLACEMENT RIGHT EYE;  Surgeon: Tonny Branch, MD;  Location: [facility];  Service: Ophthalmology;  Laterality: Right;  CDE: 11.19   ? fatty tumor removal to left arm Right   ? PACEMAKER GENERATOR CHANGE  05/31/08  ? at [facility]: MDT ADDR01/Implanted: 05/31/08 as gen change by Dr Reinaldo Berber  ? PACEMAKER INSERTION  1992  ? PPM GENERATOR CHANGEOUT N/A 01/06/2021  ? Procedure: PPM GENERATOR CHANGEOUT;  Surgeon: Thompson Grayer, MD;  Location: [place] [facility];  Service: Cardiovascular;  Laterality: N/A;  ? ?Patient Active Problem List  ? Diagnosis Date Noted  ? Tachycardia-bradycardia ([place]) 10/23/2014  ? Midsternal chest pain 09/24/2014  ? Chest pain 09/22/2014  ? CAD (coronary artery disease) 09/22/2014  ? Atrial fibrillation with controlled ventricular response ([place]) 09/22/2014  ? HTN (hypertension) 09/22/2014  ? Diabetes mellitus type 2, insulin dependent ([place]) 09/22/2014  ? Pacemaker - MDT ADDR01 Implanted: 05/31/08 Serial# [id number] 09/22/2014  ? History of CHF (congestive heart failure) 09/22/2014  ? Acute renal failure ([place]) 09/22/2014  ? Gout 09/22/2014  ? Abnormal EKG   ? ? ?REFERRING DIAG: gait and mobility abnormalities, decreased balance ? ?THERAPY DIAG:  ?Difficulty in walking, not elsewhere classified ? ?Imbalance ? ?Muscle weakness (generalized) ? ?PERTINENT HISTORY: Large medical history (see above list) including HTN, cardiac conditions, DM ? ?PRECAUTIONS: fall ? ?SUBJECTIVE: Patient reports she is doing well today, feels her balance is improving.  ?PAIN:  ?Are you having pain? Yes: NPRS  scale: 0/10 ?Pain location: lower back and anterior Rt LE ?Pain description: burning, aching ?Aggravating factors: walking ?Relieving factors: pain medication ? ?OBJECTIVE:  ? ? ?TODAYS TREATMENT: ?09/15/21 ?Nustep level 4x 5 mins at DS ? ?Tandem holds 5 sec x10 b/l at CG/mina ?Tandem walk 15'x2 at cg/mina ?Braiding 15'x2 with b/l Ue support-> no Ue support at CG/mina ? ?Retro walk with cueing for wbos, clearing feet 20'x4 all at cg/mina ? ?Walk ant with looking up-> head turns up and down on command 30'x4 all at CG/mina ? ?Step ups 6" with  mina ? ? ?09/10/21 ?Nustep level 1 x5 min at seat 6 at DS ? ?Heel raises 2x15 in // bars, 2 finger hold B/L UE-> UNI UE at CGA ?Toe raises x15 uni 2 finger hold, CGA ?Standing Marches x15 uni 2 finger hold, CGA ?Step ups to 6 inch step x10 B/L, uni UE support, CGA ?Tandem stance holds with 2 finger hold  then no UE support. Progressed onto airex beam, performed B/L at [facility]. Total of 6x20 sec ?Bipedal stance on black foam pad with alternating UE flexion x63mns, CGA ?Mini squats on black foam pad x8 reps at CG ?Hip abd with 3# ankle weights x10 performed B/L, B/L UE support, CGA ? ?Sit to stand no UE support 2x5, at CG/mina ? ? ? ? ?09/08/21 ?Nustep level 5 x 5' ? ?Standing:  ?Heel raises/toe raises x 15 each ?6" step up x 6 each side; 2 hand on // bars ?2# hip ABD, marching and hip ext x 10 reps each leg ?Tandem stance for balance 2 x 10" each CGA ? ?Sit to stand 2 x 5; one set no UE assist needs min A, one set with one UE assist with CGA ? ? ? ? 08/31/21 ?Nu step Level 2 5 minutes for warm up and conditioning ?HR/TR 1x 15 ?Standing march alternating 2x 10 bilateral  ?Hip abduction 2x 10 bilateral  ?Seated row RTB 2x 15  ?Seated hip abduction isometric 10 x 5 second holds ? ? ?08/27/21 ?  Seated heel/toe raises, marching, and hip abduction 10 reps x 2 rounds each  ?  Seated - hip adduction verse pillow 2 sets x 10 reps ?   - LAQ 2 sets x 10 reps x 2 second hold cue for long reach to full extension  ?   - gluteal squeeze set x 10 reps x 2 sets ?  Standing at // bars ?   - postural tall hold x 60 seconds, cue for gluteal activation and quad activation x 2 rounds ?   - hip extension R leg only x 10  ?   - hip marching R leg only x 10  ?   - step forward R hold 60 seconds tall posture cued x 2  ?   - step forward L hold 60 seconds tall posture cued x 2  ?  Sit to stand with 4WW with cue for light hands on chair and gluteal activation for lift x 2 for functional transition practicing ?   ?    ? 08/25/21 ? Seated heel/toe raises,  marching and hip abduction 10X each (HEP review once again per request) ? Seated: sit to stands elevated surface no UE assist 5X ?  Long arc quad 10X3" holds ?Supine:  decompression exercises 1-4 5X3" each ? Bridge 10X with improved ROM ?Heelslides 10X each ? ?  ?08/22/21 ?HEP review including: ?  Seated heel/toe raises, marching and hip abduction 10X each ? Seated:  sit to stands elevated surface no UE assist 5X ?  Long arc quad 10X3" holds ?Supine:  decompression exercises 1-4 5X3" each ? Bridge with minimal ROM, more glute squeeze 10X3" ? ?  ?PATIENT EDUCATION: ?Education details: 09/15/21 educated on wider base of support providing patient more stability. Continued ti educated to have assist if performing sit to stands with UE support at home. HEP review, importance of HEP compliance,  safety  ?  ?08/27/21: knee muscle anatomy and hip support  4/24 HEP ? ?Person educated: Patient ?Education method: Explanation, Demonstration, and Handouts ?Education comprehension: verbalized understanding ?  ?  ?HOME EXERCISE PROGRAM: ?Access Code: [email or web address] ?URL: [email or web address] ?Date: 09/10/2021 ?Prepared by: [facility] ? ?Exercises ?- Sit to Stand Without Arm Support  - 2 x daily - 7 x weekly - 2 sets - 5 reps ? ?  ?At evaluation:  Access Code: [email or web address] ?URL: [email or web address] ?Date: 07/30/2021 ?Prepared by: Jerilynn Som ? Exercises ?- Seated Heel Toe Raises  - 2 x daily - 7 x weekly - 1-2 sets - 10 reps ?- Seated March  - 2 x daily - 7 x weekly - 1-2 sets - 10 reps ?- Seated Hip Abduction  - 2 x daily - 7 x weekly - 1-2 sets - 10 reps ? ?08/27/21 ADDED =  ? - Seated Hip Adduction with a Pillow Squeeze  - 2 x daily - 7 x weekly - 2 sets - 10 reps ?- Seated Long Arc Quad  - 2 x daily - 7 x weekly - 2 sets - 10 reps ?  ?  ?GOALS: ?Goals reviewed with patient? yes ?  ?SHORT TERM GOALS: Target date: 08/28/2021 ?  ?Patient will be independent with HEP for ability to self progress with safety  awareness to decrease fall risk.  ?Baseline: 07/30/21 - initiated today ?Goal status: ONGOING ?  ?2.  Patient will be able to demonstrate improved upright posture with minimal rounded shoulders and erect spine for improved c

## 2021-09-17 ENCOUNTER — Ambulatory Visit (HOSPITAL_COMMUNITY): Payer: Medicare Other

## 2021-09-17 ENCOUNTER — Telehealth (HOSPITAL_COMMUNITY): Payer: Self-pay

## 2021-09-17 NOTE — Telephone Encounter (Signed)
Lack of Transportation °

## 2021-09-22 ENCOUNTER — Ambulatory Visit (HOSPITAL_COMMUNITY): Payer: Medicare Other | Admitting: Physical Therapy

## 2021-09-22 ENCOUNTER — Encounter (HOSPITAL_COMMUNITY): Payer: Self-pay | Admitting: Physical Therapy

## 2021-09-22 DIAGNOSIS — R262 Difficulty in walking, not elsewhere classified: Secondary | ICD-10-CM

## 2021-09-22 DIAGNOSIS — R2689 Other abnormalities of gait and mobility: Secondary | ICD-10-CM | POA: Diagnosis not present

## 2021-09-22 DIAGNOSIS — M6281 Muscle weakness (generalized): Secondary | ICD-10-CM

## 2021-09-22 NOTE — Therapy (Signed)
?OUTPATIENT PHYSICAL THERAPY TREATMENT NOTE ? ? ?Patient Name: Megan Walsh ?MRN: [id number] ?DOB:1932-06-27, 88 y.o., female ?Today's Date: 09/22/2021 ? ?PCP: Monico Blitz, MD ?REFERRING PROVIDER: Phillips Odor, MD  ? ?END OF SESSION:  ? PT End of Session - 09/22/21 1317   ? ? Visit Number 9   ? Number of Visits 16   ? Date for PT Re-Evaluation 10/03/21   ? Authorization Type 1) UHC medicare 2)medicaid   ? Authorization Time Period no auth  required follow medicare guidelines   ? Progress Note Due on Visit 10   ? PT Start Time 7564   ? PT Stop Time 1356   ? PT Time Calculation (min) 39 min   ? Activity Tolerance Patient tolerated treatment well   ? Behavior During Therapy [facility] for tasks assessed/performed   ? ?  ?  ? ?  ? ? ? ? ? ?Past Medical History:  ?Diagnosis Date  ? Anxiety   ? Arthritis   ? CHF (congestive heart failure) ([place])   ? Coronary artery disease   ? Diabetes mellitus without complication ([place])   ? Dysrhythmia   ? a-fib  ? Fatty tumor   ? History of gout   ? Hypertension   ? Myocardial infarction [facility])   ? Permanent atrial fibrillation ([place])   ? Presence of permanent cardiac pacemaker   ? MDT ADDR01/Implanted: 05/31/08  ? Sleep apnea   ? cannot tolerate CPAP  ? Symptomatic bradycardia   ? ?Past Surgical History:  ?Procedure Laterality Date  ? ABDOMINAL HYSTERECTOMY    ? BROW LIFT Bilateral 03/10/2018  ? Procedure: BLEPHAROPLASTY BILATERAL UPPER EYELIDS;  Surgeon: Baruch Goldmann, MD;  Location: [facility];  Service: Ophthalmology;  Laterality: Bilateral;  ? CATARACT EXTRACTION W/PHACO Left 10/17/2017  ? Procedure: CATARACT EXTRACTION PHACO AND INTRAOCULAR LENS PLACEMENT LEFT EYE;  Surgeon: Tonny Branch, MD;  Location: [facility];  Service: Ophthalmology;  Laterality: Left;  CDE: 11.64  ? CATARACT EXTRACTION W/PHACO Right 10/31/2017  ? Procedure: CATARACT EXTRACTION PHACO AND INTRAOCULAR LENS PLACEMENT RIGHT EYE;  Surgeon: Tonny Branch, MD;  Location: [facility];  Service: Ophthalmology;  Laterality: Right;  CDE: 11.19   ? fatty tumor removal to left arm Right   ? PACEMAKER GENERATOR CHANGE  05/31/08  ? at [facility]: MDT ADDR01/Implanted: 05/31/08 as gen change by Dr Reinaldo Berber  ? PACEMAKER INSERTION  1992  ? PPM GENERATOR CHANGEOUT N/A 01/06/2021  ? Procedure: PPM GENERATOR CHANGEOUT;  Surgeon: Thompson Grayer, MD;  Location: [place] [facility];  Service: Cardiovascular;  Laterality: N/A;  ? ?Patient Active Problem List  ? Diagnosis Date Noted  ? Tachycardia-bradycardia ([place]) 10/23/2014  ? Midsternal chest pain 09/24/2014  ? Chest pain 09/22/2014  ? CAD (coronary artery disease) 09/22/2014  ? Atrial fibrillation with controlled ventricular response ([place]) 09/22/2014  ? HTN (hypertension) 09/22/2014  ? Diabetes mellitus type 2, insulin dependent ([place]) 09/22/2014  ? Pacemaker - MDT ADDR01 Implanted: 05/31/08 Serial# [id number] 09/22/2014  ? History of CHF (congestive heart failure) 09/22/2014  ? Acute renal failure ([place]) 09/22/2014  ? Gout 09/22/2014  ? Abnormal EKG   ? ? ?REFERRING DIAG: gait and mobility abnormalities, decreased balance ? ?THERAPY DIAG:  ?Difficulty in walking, not elsewhere classified ? ?Imbalance ? ?Muscle weakness (generalized) ? ?PERTINENT HISTORY: Large medical history (see above list) including HTN, cardiac conditions, DM ? ?PRECAUTIONS: fall ? ?SUBJECTIVE: Has been doing well. No falls. Has walked a little bit without her walker. Feel that therapy has been helping her.  ?  PAIN:  ?Are you having pain? Yes: NPRS scale: 0/10 ?Pain location: lower back and anterior Rt LE ?Pain description: burning, aching ?Aggravating factors: walking ?Relieving factors: pain medication ? ?OBJECTIVE:  ? ? ?TODAYS TREATMENT: ?09/22/21 ?Nustep level 5 x 5 minutes for dynamic warm up ?Step up 6 inch step 2x 10 bilateral  ?Gait with head turns (up down, left and Right) 4x 30 feet ?Tandem walk 15'x2 at cg/mina ? ?09/15/21 ?Nustep level 4x 5 mins at DS ? ?Tandem holds 5 sec x10 b/l at CG/mina ?Tandem walk 15'x2 at cg/mina ?Braiding 15'x2 with b/l Ue  support-> no Ue support at CG/mina ? ?Retro walk with cueing for wbos, clearing feet 20'x4 all at cg/mina ? ?Walk ant with looking up-> head turns up and down on command 30'x4 all at CG/mina ? ?Step ups 6" with mina ? ? ?09/10/21 ?Nustep level 1 x5 min at seat 6 at DS ? ?Heel raises 2x15 in // bars, 2 finger hold B/L UE-> UNI UE at CGA ?Toe raises x15 uni 2 finger hold, CGA ?Standing Marches x15 uni 2 finger hold, CGA ?Step ups to 6 inch step x10 B/L, uni UE support, CGA ?Tandem stance holds with 2 finger hold  then no UE support. Progressed onto airex beam, performed B/L at [facility]. Total of 6x20 sec ?Bipedal stance on black foam pad with alternating UE flexion x12mns, CGA ?Mini squats on black foam pad x8 reps at CG ?Hip abd with 3# ankle weights x10 performed B/L, B/L UE support, CGA ? ?Sit to stand no UE support 2x5, at CG/mina ? ? ? ? ?09/08/21 ?Nustep level 5 x 5' ? ?Standing:  ?Heel raises/toe raises x 15 each ?6" step up x 6 each side; 2 hand on // bars ?2# hip ABD, marching and hip ext x 10 reps each leg ?Tandem stance for balance 2 x 10" each CGA ? ?Sit to stand 2 x 5; one set no UE assist needs min A, one set with one UE assist with CGA ? ? ? ? 08/31/21 ?Nu step Level 2 5 minutes for warm up and conditioning ?HR/TR 1x 15 ?Standing march alternating 2x 10 bilateral  ?Hip abduction 2x 10 bilateral  ?Seated row RTB 2x 15  ?Seated hip abduction isometric 10 x 5 second holds ? ? ? ?  ?PATIENT EDUCATION: ?Education details:  08/27/21: knee muscle anatomy and hip support  4/24 HEP5/9/23 educated on wider base of support providing patient more stability. Continued ti educated to have assist if performing sit to stands with UE support at home. HEP review, importance of HEP compliance,  safety  09/22/21 HEP ? ?Person educated: Patient ?Education method: Explanation, Demonstration, and Handouts ?Education comprehension: verbalized understanding ?  ?  ?HOME EXERCISE PROGRAM: ? ? ?  ?At evaluation:  Access Code: [email or web address]?URL:  [email or web address] ?Date: 07/30/2021 ?Prepared by: PJerilynn Som? Exercises ?- Seated Heel Toe Raises  - 2 x daily - 7 x weekly - 1-2 sets - 10 reps ?- Seated March  - 2 x daily - 7 x weekly - 1-2 sets - 10 reps ?- Seated Hip Abduction  - 2 x daily - 7 x weekly - 1-2 sets - 10 reps ? ?08/27/21 ADDED =  ? - Seated Hip Adduction with a Pillow Squeeze  - 2 x daily - 7 x weekly - 2 sets - 10 reps ?- Seated Long Arc Quad  - 2 x daily - 7 x weekly - 2 sets - 10 reps ? Access Code:  [email or web address] ?URL: [email or web address] ?Date: 09/10/2021 ?Prepared by: [facility] ? ?Exercises ?- Sit to Stand Without Arm Support  - 2 x daily - 7 x weekly - 2 sets - 5 reps ?  ?GOALS: ?Goals reviewed with patient? yes ?  ?SHORT TERM GOALS: Target date: 08/28/2021 ?  ?Patient will be independent with HEP for ability to self progress with safety awareness to decrease fall risk.  ?Baseline: 07/30/21 - initiated today ?Goal status: ONGOING ?  ?2.  Patient will be able to demonstrate improved upright posture with minimal rounded shoulders and erect spine for improved center of balance alignment.  ?Baseline: 07/30/21 - flexed posture in standing overall ?Goal status:  ONGOING ?  ?LONG TERM GOALS: Target date: 09/25/2021 ?  ?Patient will be able to demonstrate overall gross B LE strength to at least 4/5, especially in knee extension.  ?Baseline: 07/30/21 - 3+/5 ?Goal status: ONGOING ?  ?2.  Patient will be able to preform TUG testing in less than 45 seconds for good improved and working toward decreased fall risk.  ?Baseline: 07/30/21 - 1 min 16 seconds ?Goal status: ONGOING ?  ?3.  Patient will be able to report a decrease in fall over 2 months of treatments to no falls for improved safety.  ?Baseline: Patient reports consistent falling each month ?Goal status: ONGOING ?  ?  ?ASSESSMENT: ?  ?CLINICAL IMPRESSION: ? Began session on nustep for warm up and patient tolerating increased resistance while keeping SPM >60. She  demonstrates impaired endurance with fatigue after 4 minutes.  She requires intermittent rest breaks for fatigue. Patient given intermittent cueing for mechanics and sequencing with good/fair carry over. P

## 2021-09-24 ENCOUNTER — Ambulatory Visit (HOSPITAL_COMMUNITY): Payer: Medicare Other

## 2021-09-24 DIAGNOSIS — M6281 Muscle weakness (generalized): Secondary | ICD-10-CM

## 2021-09-24 DIAGNOSIS — R2689 Other abnormalities of gait and mobility: Secondary | ICD-10-CM | POA: Diagnosis not present

## 2021-09-24 DIAGNOSIS — R262 Difficulty in walking, not elsewhere classified: Secondary | ICD-10-CM | POA: Diagnosis not present

## 2021-09-24 NOTE — Therapy (Signed)
OUTPATIENT PHYSICAL THERAPY DISCHARGE NOTE  PHYSICAL THERAPY DISCHARGE SUMMARY  Visits from Start of Care: 10  Current functional level related to goals / functional outcomes: See below   Remaining deficits: See below   Education / Equipment: See below   Patient agrees to discharge. Patient goals were met. Patient is being discharged due to meeting the stated rehab goals.   Progress Note Reporting Period 07/30/2021 to 09/24/2021  See note below for Objective Data and Assessment of Progress/Goals.       Patient Name: Megan Walsh MRN: [id number] DOB:[birth year], 88 y.o., female Today's Date: 09/24/2021  PCP: Monico Blitz, MD REFERRING PROVIDER: Phillips Odor, MD   END OF SESSION:   PT End of Session - 09/24/21 1303     Visit Number 10    Number of Visits 16    Date for PT Re-Evaluation 10/03/21    Authorization Type 1) UHC medicare 2)medicaid    Authorization Time Period no auth  required follow medicare guidelines    Progress Note Due on Visit 20    PT Start Time 1300    PT Stop Time 1340    PT Time Calculation (min) 40 min    Activity Tolerance Patient tolerated treatment well    Behavior During Therapy WFL for tasks assessed/performed                 Past Medical History:  Diagnosis Date   Anxiety    Arthritis    CHF (congestive heart failure) ([place])    Coronary artery disease    Diabetes mellitus without complication ([place])    Dysrhythmia    a-fib   Fatty tumor    History of gout    Hypertension    Myocardial infarction [facility])    Permanent atrial fibrillation ([place])    Presence of permanent cardiac pacemaker    MDT ADDR01/Implanted: 05/31/08   Sleep apnea    cannot tolerate CPAP   Symptomatic bradycardia    Past Surgical History:  Procedure Laterality Date   ABDOMINAL HYSTERECTOMY     BROW LIFT Bilateral 03/10/2018   Procedure: BLEPHAROPLASTY BILATERAL UPPER EYELIDS;  Surgeon: Baruch Goldmann, MD;  Location: [facility];  Service:  Ophthalmology;  Laterality: Bilateral;   CATARACT EXTRACTION W/PHACO Left 10/17/2017   Procedure: CATARACT EXTRACTION PHACO AND INTRAOCULAR LENS PLACEMENT LEFT EYE;  Surgeon: Tonny Branch, MD;  Location: [facility];  Service: Ophthalmology;  Laterality: Left;  CDE: 11.64   CATARACT EXTRACTION W/PHACO Right 10/31/2017   Procedure: CATARACT EXTRACTION PHACO AND INTRAOCULAR LENS PLACEMENT RIGHT EYE;  Surgeon: Tonny Branch, MD;  Location: [facility];  Service: Ophthalmology;  Laterality: Right;  CDE: 11.19   fatty tumor removal to left arm Right    PACEMAKER GENERATOR CHANGE  05/31/08   at [facility]: MDT ADDR01/Implanted: 05/31/08 as gen change by Dr Reinaldo Berber   PACEMAKER INSERTION  1992   PPM GENERATOR CHANGEOUT N/A 01/06/2021   Procedure: PPM GENERATOR CHANGEOUT;  Surgeon: Thompson Grayer, MD;  Location: [place] CV LAB;  Service: Cardiovascular;  Laterality: N/A;   Patient Active Problem List   Diagnosis Date Noted   Tachycardia-bradycardia ([place]) 10/23/2014   Midsternal chest pain 09/24/2014   Chest pain 09/22/2014   CAD (coronary artery disease) 09/22/2014   Atrial fibrillation with controlled ventricular response ([place]) 09/22/2014   HTN (hypertension) 09/22/2014   Diabetes mellitus type 2, insulin dependent ([place]) 09/22/2014   Pacemaker - MDT ADDR01 Implanted: 05/31/08 Serial# [id number] 09/22/2014   History of CHF (congestive heart failure) 09/22/2014  Acute renal failure ([place]) 09/22/2014   Gout 09/22/2014   Abnormal EKG     REFERRING DIAG: gait and mobility abnormalities, decreased balance  THERAPY DIAG:  Difficulty in walking, not elsewhere classified  Imbalance  Muscle weakness (generalized)  PERTINENT HISTORY: Large medical history (see above list) including HTN, cardiac conditions, DM  PRECAUTIONS: fall  SUBJECTIVE: some Right knee pain today 6/10; no new falls. No issues with HEP PAIN:  Are you having pain? Yes: NPRS scale: 6/10 Pain location: right knee Pain description:   aching Aggravating factors: walking Relieving factors: pain medication  OBJECTIVE:    TODAYS TREATMENT: 09/24/21    Progress note    TUG 2nd trial 35 sec with rollator    MMT's hip Flexion R 4/5, L 4+/5     Knee extension 4+/5 bilat     Knee flexion (sitting) 4+/5     Ankle DF 4+/5 bilat     1 lap around the gym    5 x sit to stand 53 sec 1st trial; 32 sec 2nd trial; using arms to assist         09/22/21 Nustep level 5 x 5 minutes for dynamic warm up Step up 6 inch step 2x 10 bilateral  Gait with head turns (up down, left and Right) 4x 30 feet Tandem walk 15'x2 at cg/mina  09/15/21 Nustep level 4x 5 mins at DS  Tandem holds 5 sec x10 b/l at CG/mina Tandem walk 15'x2 at cg/mina Braiding 15'x2 with b/l Ue support-> no Ue support at CG/mina  Retro walk with cueing for wbos, clearing feet 20'x4 all at cg/mina  Walk ant with looking up-> head turns up and down on command 30'x4 all at CG/mina  Step ups 6" with mina   09/10/21 Nustep level 1 x5 min at seat 6 at DS  Heel raises 2x15 in // bars, 2 finger hold B/L UE-> UNI UE at CGA Toe raises x15 uni 2 finger hold, CGA Standing Marches x15 uni 2 finger hold, CGA Step ups to 6 inch step x10 B/L, uni UE support, CGA Tandem stance holds with 2 finger hold  then no UE support. Progressed onto airex beam, performed B/L at [facility]. Total of 6x20 sec Bipedal stance on black foam pad with alternating UE flexion x11mns, CGA Mini squats on black foam pad x8 reps at CG Hip abd with 3# ankle weights x10 performed B/L, B/L UE support, CGA  Sit to stand no UE support 2x5, at CG/mina     09/08/21 Nustep level 5 x 5'  Standing:  Heel raises/toe raises x 15 each 6" step up x 6 each side; 2 hand on // bars 2# hip ABD, marching and hip ext x 10 reps each leg Tandem stance for balance 2 x 10" each CGA  Sit to stand 2 x 5; one set no UE assist needs min A, one set with one UE assist with CGA     08/31/21 Nu step Level 2 5 minutes for  warm up and conditioning HR/TR 1x 15 Standing march alternating 2x 10 bilateral  Hip abduction 2x 10 bilateral  Seated row RTB 2x 15  Seated hip abduction isometric 10 x 5 second holds      PATIENT EDUCATION: Education details: 09/24/21: added sit to stand to HEP; fall prevention education, how to get back in to physical therapy if services are needed in the future.  08/27/21: knee muscle anatomy and hip support  4/24 HEP5/9/23 educated on wider base of support  providing patient more stability. Continued ti educated to have assist if performing sit to stands with UE support at home. HEP review, importance of HEP compliance,  safety  09/22/21 HEP  Person educated: Patient Education method: Explanation, Demonstration, and Handouts Education comprehension: verbalized understanding     HOME EXERCISE PROGRAM: Access Code: [email or web address] URL: [email or web address] Date: 09/24/2021 Prepared by: [facility]  Exercises - Sit to Stand with Counter Support  - 2 x daily - 7 x weekly - 2 sets - 5 reps    At evaluation:  Access Code: [email or web address] URL: [email or web address] Date: 07/30/2021 Prepared by: Jerilynn Som  Exercises - Seated Heel Toe Raises  - 2 x daily - 7 x weekly - 1-2 sets - 10 reps - Seated March  - 2 x daily - 7 x weekly - 1-2 sets - 10 reps - Seated Hip Abduction  - 2 x daily - 7 x weekly - 1-2 sets - 10 reps  08/27/21 ADDED =   - Seated Hip Adduction with a Pillow Squeeze  - 2 x daily - 7 x weekly - 2 sets - 10 reps - Seated Long Arc Quad  - 2 x daily - 7 x weekly - 2 sets - 10 reps  Access Code: [email or web address] URL: [email or web address] Date: 09/10/2021 Prepared by: [facility]  Exercises - Sit to Stand Without Arm Support  - 2 x daily - 7 x weekly - 2 sets - 5 reps   GOALS: Goals reviewed with patient? yes   SHORT TERM GOALS: Target date: 08/28/2021   Patient will be independent with HEP for ability to self progress with safety awareness  to decrease fall risk.  Baseline: 07/30/21 - initiated today Goal status: met   2.  Patient will be able to demonstrate improved upright posture with minimal rounded shoulders and erect spine for improved center of balance alignment.  Baseline: 07/30/21 - flexed posture in standing overall, 09/24/21 able to stand tall today Goal status:  met   LONG TERM GOALS: Target date: 09/25/2021   Patient will be able to demonstrate overall gross B LE strength to at least 4/5, especially in knee extension.  Baseline: 07/30/21 - 3+/5, 09/24/21 MMTs grossly 4-4+/5 throughout Goal status: met   2.  Patient will be able to preform TUG testing in less than 45 seconds for good improved and working toward decreased fall risk.  Baseline: 07/30/21 - 1 min 16 seconds, 09/24/21 35 sec Goal status: MET   3.  Patient will be able to report a decrease in fall over 2 months of treatments to no falls for improved safety.  Baseline: Patient reports consistent falling each month; 09/24/21 no recent falls. Goal status: met     ASSESSMENT:   CLINICAL IMPRESSION:  Progress note done today; patient with good improvements with strength, posture and TUG; met all set rehab goals today. Still needs occasional reminders for standing tall with ambulation and picking up her feet to avoid shuffling gait pattern.  Discharge to [facility] today.      OBJECTIVE IMPAIRMENTS Abnormal gait, decreased activity tolerance, decreased balance, decreased coordination, decreased endurance, decreased mobility, difficulty walking, decreased ROM, decreased strength, decreased safety awareness, and impaired flexibility.    ACTIVITY LIMITATIONS cleaning, driving, meal prep, and laundry.    PLAN: PT FREQUENCY: 1-2x/week   PT DURATION: 8 weeks   PLANNED INTERVENTIONS: Therapeutic exercises, Therapeutic activity, Neuromuscular re-education, Balance training, Gait training, Patient/Family education, Joint mobilization, Stair training, and Vestibular  training   PLAN  FOR NEXT SESSION: discharge.    1:44 PM, 09/24/21 Auriel Kist Small Shakirra Buehler MPT [facility] [place] [phone]

## 2021-10-09 ENCOUNTER — Ambulatory Visit (INDEPENDENT_AMBULATORY_CARE_PROVIDER_SITE_OTHER): Payer: Medicare Other

## 2021-10-09 DIAGNOSIS — I495 Sick sinus syndrome: Secondary | ICD-10-CM | POA: Diagnosis not present

## 2021-10-11 LAB — CUP PACEART REMOTE DEVICE CHECK
Battery Remaining Longevity: 173 mo
Battery Voltage: 3.18 V
Brady Statistic AP VP Percent: 0 %
Brady Statistic AP VS Percent: 0 %
Brady Statistic AS VP Percent: 28.65 %
Brady Statistic AS VS Percent: 71.35 %
Brady Statistic RA Percent Paced: 0 %
Brady Statistic RV Percent Paced: 28.65 %
Date Time Interrogation Session: 20230602011517
Implantable Lead Implant Date: 19980116
Implantable Lead Implant Date: 19980116
Implantable Lead Location: 753859
Implantable Lead Location: 753860
Implantable Lead Model: 5034
Implantable Lead Model: 5534
Implantable Pulse Generator Implant Date: 20220830
Lead Channel Impedance Value: 1083 Ohm
Lead Channel Impedance Value: 1197 Ohm
Lead Channel Impedance Value: 1235 Ohm
Lead Channel Impedance Value: 1254 Ohm
Lead Channel Pacing Threshold Amplitude: 0.75 V
Lead Channel Pacing Threshold Pulse Width: 0.4 ms
Lead Channel Sensing Intrinsic Amplitude: 14.125 mV
Lead Channel Sensing Intrinsic Amplitude: 14.125 mV
Lead Channel Sensing Intrinsic Amplitude: 2.25 mV
Lead Channel Setting Pacing Amplitude: 2 V
Lead Channel Setting Pacing Pulse Width: 0.4 ms
Lead Channel Setting Sensing Sensitivity: 5.6 mV

## 2021-10-16 NOTE — Progress Notes (Signed)
Remote pacemaker transmission.   

## 2021-11-11 DIAGNOSIS — M542 Cervicalgia: Secondary | ICD-10-CM | POA: Diagnosis not present

## 2021-11-11 DIAGNOSIS — M5412 Radiculopathy, cervical region: Secondary | ICD-10-CM | POA: Diagnosis not present

## 2021-11-11 DIAGNOSIS — M79609 Pain in unspecified limb: Secondary | ICD-10-CM | POA: Diagnosis not present

## 2021-11-11 DIAGNOSIS — M545 Low back pain, unspecified: Secondary | ICD-10-CM | POA: Diagnosis not present

## 2021-11-11 DIAGNOSIS — R2689 Other abnormalities of gait and mobility: Secondary | ICD-10-CM | POA: Diagnosis not present

## 2021-12-12 IMAGING — DX DG CHEST 1V PORT
1 series · 1 of 1 positions shown · non-contrast
Comparison: Chest x-ray 10/04/2016.

CLINICAL DATA: 87-year-old female with history of shortness of
breath, nausea and weakness.

EXAM:
PORTABLE CHEST 1 VIEW

[chest ap]
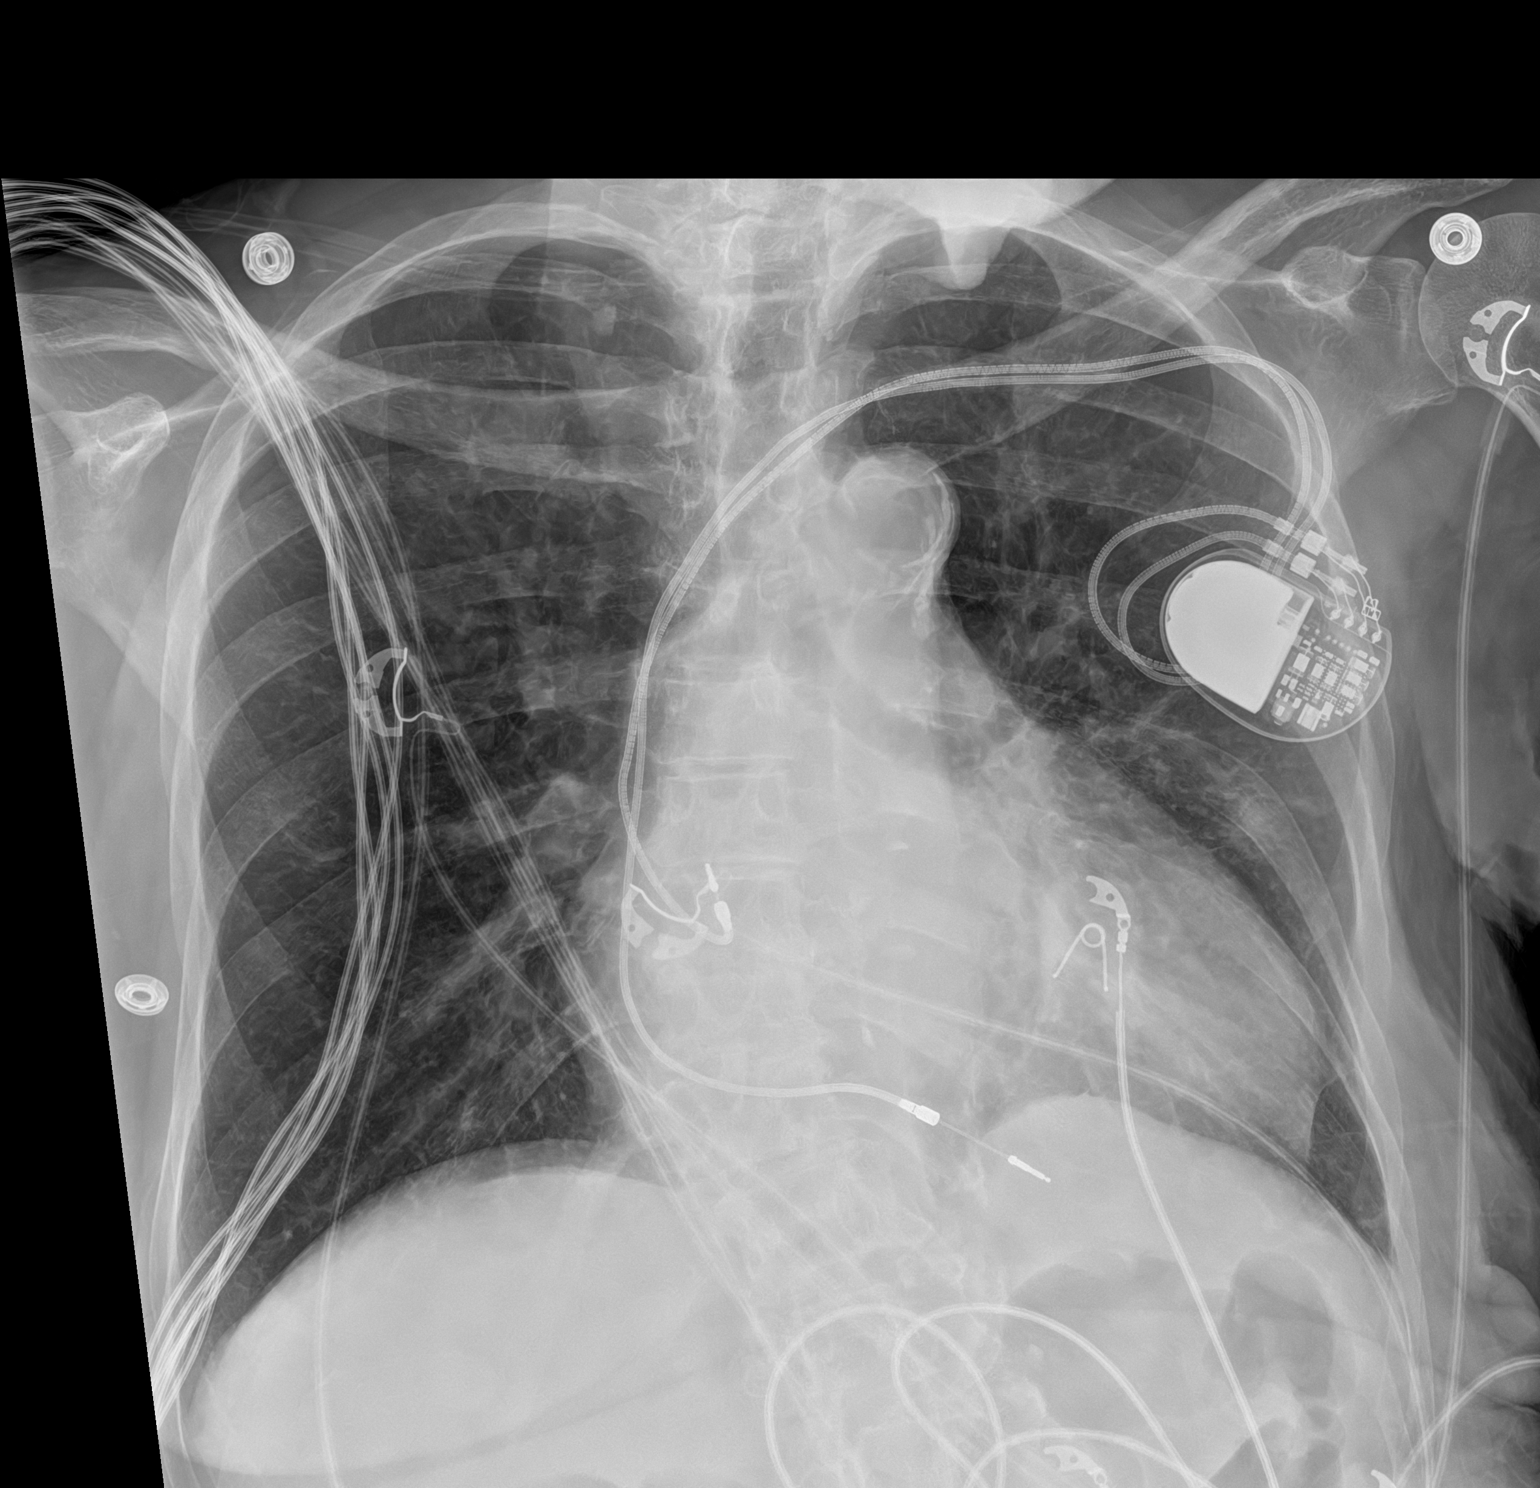

[1 of 1 positions shown; findings below may reference images not displayed]

FINDINGS: Lung volumes are normal. No consolidative airspace disease. No
pleural effusions. No pneumothorax. Small calcified granuloma in the
medial aspect of the right upper lobe, similar to the prior
examination. No pulmonary nodule or mass noted. Pulmonary
vasculature is normal. Heart size is mildly enlarged. Upper
mediastinal contours are within normal limits. Atherosclerotic
calcifications in the thoracic aorta. Left-sided pacemaker device in
place with lead tips projecting over the expected location of the
right atrium and right ventricle.
IMPRESSION: 1.  No radiographic evidence of acute cardiopulmonary disease.
2. Mild cardiomegaly.
3. Aortic atherosclerosis.

## 2021-12-22 DIAGNOSIS — I10 Essential (primary) hypertension: Secondary | ICD-10-CM | POA: Diagnosis not present

## 2021-12-22 DIAGNOSIS — Z0000 Encounter for general adult medical examination without abnormal findings: Secondary | ICD-10-CM | POA: Diagnosis not present

## 2021-12-22 DIAGNOSIS — Z7189 Other specified counseling: Secondary | ICD-10-CM | POA: Diagnosis not present

## 2021-12-22 DIAGNOSIS — Z79899 Other long term (current) drug therapy: Secondary | ICD-10-CM | POA: Diagnosis not present

## 2021-12-22 DIAGNOSIS — L299 Pruritus, unspecified: Secondary | ICD-10-CM | POA: Diagnosis not present

## 2021-12-22 DIAGNOSIS — E7800 Pure hypercholesterolemia, unspecified: Secondary | ICD-10-CM | POA: Diagnosis not present

## 2021-12-22 DIAGNOSIS — Z6822 Body mass index (BMI) 22.0-22.9, adult: Secondary | ICD-10-CM | POA: Diagnosis not present

## 2021-12-22 DIAGNOSIS — R5383 Other fatigue: Secondary | ICD-10-CM | POA: Diagnosis not present

## 2022-01-01 ENCOUNTER — Ambulatory Visit (INDEPENDENT_AMBULATORY_CARE_PROVIDER_SITE_OTHER): Payer: Medicare Other | Admitting: Cardiology

## 2022-01-01 ENCOUNTER — Encounter: Payer: Self-pay | Admitting: Cardiology

## 2022-01-01 VITALS — BP 130/78 | HR 96 | Ht 65.0 in | Wt 137.0 lb

## 2022-01-01 DIAGNOSIS — D6869 Other thrombophilia: Secondary | ICD-10-CM

## 2022-01-01 DIAGNOSIS — I890 Lymphedema, not elsewhere classified: Secondary | ICD-10-CM | POA: Diagnosis not present

## 2022-01-01 DIAGNOSIS — I495 Sick sinus syndrome: Secondary | ICD-10-CM

## 2022-01-01 DIAGNOSIS — I4821 Permanent atrial fibrillation: Secondary | ICD-10-CM | POA: Diagnosis not present

## 2022-01-01 MED ORDER — METOPROLOL SUCCINATE ER 25 MG PO TB24: 12.5000 mg | ORAL_TABLET | Freq: Every day | ORAL | 6 refills | Status: DC

## 2022-01-01 NOTE — Patient Instructions (Addendum)
Medication Instructions:  Begin Toprol XL 12.'5mg'$  daily  Hold your Simvastatin x 2 weeks - if itching is better, then call the office so provider can replace with different medication.  If itching is no better, then resume the Simvastatin.   Continue all other medications.     Labwork: none  Testing/Procedures: none  Follow-Up: 6 months   Any Other Special Instructions Will Be Listed Below (If Applicable).   If you need a refill on your cardiac medications before your next appointment, please call your pharmacy.

## 2022-01-01 NOTE — Progress Notes (Signed)
Clinical Summary Megan Walsh is a 88 y.o.female seen today for follow up of the following medical problems.    1. Chronic diastolic HF/Lymphedema/Leg swelling - admit to [facility] 07/2017 with fluid overload -07/2017 echo showed LVEF 55-60%. Diuresed with improved symptoms    - completed lymphedema clinic in 01/2018. Has home compression stockings and pump.      - Previously had come off lasix by another provider.  - lymphedema pump broke, she is not sure how to get repaired or replaced     2. Permanent pacemaker - 12/2020 gen change -10/2021 normal device check     3. Afib - some heart palpitations at times.    l   4. Hypertrophic CM - echo with severe asymmetric septal hypertrophy in 2016, no gradient.  - denies symptoms     5. Hyperlipidemia -recent labs by pcp - she is on simvastatin   6. CKD - followed by Dr Theador Hawthorne Past Medical History:  Diagnosis Date   Anxiety    Arthritis    CHF (congestive heart failure) ([place])    Coronary artery disease    Diabetes mellitus without complication (HCC)    Dysrhythmia    a-fib   Fatty tumor    History of gout    Hypertension    Myocardial infarction [facility])    Permanent atrial fibrillation (HCC)    Presence of permanent cardiac pacemaker    MDT ADDR01/Implanted: 05/31/08   Sleep apnea    cannot tolerate CPAP   Symptomatic bradycardia      Allergies  Allergen Reactions   Atorvastatin Swelling and Cough     Current Outpatient Medications  Medication Sig Dispense Refill   acetaminophen (TYLENOL) 650 MG CR tablet Take 650 mg by mouth 2 (two) times daily as needed for pain.      allopurinol (ZYLOPRIM) 300 MG tablet Take 300 mg by mouth daily.     ascorbic acid (VITAMIN C) 500 MG tablet Take 500 mg by mouth daily.     DULoxetine (CYMBALTA) 20 MG capsule Take 20 mg by mouth daily.     ELIQUIS 2.5 MG TABS tablet TAKE 1 TABLET BY MOUTH TWICE DAILY 60 tablet 6   HYDROcodone-acetaminophen (NORCO/VICODIN) 5-325 MG  tablet Take 1 tablet by mouth every 6 (six) hours as needed for severe pain.     potassium chloride SA (K-DUR,KLOR-CON) 20 MEQ tablet Take 20 mEq by mouth daily.     simvastatin (ZOCOR) 40 MG tablet Take 40 mg by mouth at bedtime.     triamcinolone cream (KENALOG) 0.1 % Apply 1 application topically 2 (two) times daily as needed.     No current facility-administered medications for this visit.     Past Surgical History:  Procedure Laterality Date   ABDOMINAL HYSTERECTOMY     BROW LIFT Bilateral 03/10/2018   Procedure: BLEPHAROPLASTY BILATERAL UPPER EYELIDS;  Surgeon: Baruch Goldmann, MD;  Location: [facility];  Service: Ophthalmology;  Laterality: Bilateral;   CATARACT EXTRACTION W/PHACO Left 10/17/2017   Procedure: CATARACT EXTRACTION PHACO AND INTRAOCULAR LENS PLACEMENT LEFT EYE;  Surgeon: Tonny , MD;  Location: [facility];  Service: Ophthalmology;  Laterality: Left;  CDE: 11.64   CATARACT EXTRACTION W/PHACO Right 10/31/2017   Procedure: CATARACT EXTRACTION PHACO AND INTRAOCULAR LENS PLACEMENT RIGHT EYE;  Surgeon: Tonny , MD;  Location: [facility];  Service: Ophthalmology;  Laterality: Right;  CDE: 11.19   fatty tumor removal to left arm Right    PACEMAKER GENERATOR CHANGE  05/31/08   at [facility]: MDT ADDR01/Implanted: 05/31/08 as gen change by Dr Reinaldo Berber   PACEMAKER INSERTION  1992   PPM GENERATOR CHANGEOUT N/A 01/06/2021   Procedure: PPM GENERATOR Tonita Cong;  Surgeon: Thompson Grayer, MD;  Location: [place] [facility];  Service: Cardiovascular;  Laterality: N/A;     Allergies  Allergen Reactions   Atorvastatin Swelling and Cough      Family History  Problem Relation Age of Onset   Heart failure Mother      Social History [name] reports that she quit smoking about 47 years ago. Her smoking use included cigarettes. She started smoking about 77 years ago. She has a 30.00 pack-year smoking history. She has never used smokeless tobacco. [name] reports no history of alcohol  use.   Review of Systems CONSTITUTIONAL: No weight loss, fever, chills, weakness or fatigue.  HEENT: Eyes: No visual loss, blurred vision, double vision or yellow sclerae.No hearing loss, sneezing, congestion, runny nose or sore throat.  SKIN: No rash or itching.  CARDIOVASCULAR: per hpi RESPIRATORY: No shortness of breath, cough or sputum.  GASTROINTESTINAL: No anorexia, nausea, vomiting or diarrhea. No abdominal pain or blood.  GENITOURINARY: No burning on urination, no polyuria NEUROLOGICAL: No headache, dizziness, syncope, paralysis, ataxia, numbness or tingling in the extremities. No change in bowel or bladder control.  MUSCULOSKELETAL: No muscle, back pain, joint pain or stiffness.  LYMPHATICS: No enlarged nodes. No history of splenectomy.  PSYCHIATRIC: No history of depression or anxiety.  ENDOCRINOLOGIC: No reports of sweating, cold or heat intolerance. No polyuria or polydipsia.  Marland Kitchen   Physical Examination Today's Vitals   01/01/22 1020  BP: 130/78  Pulse: 96  SpO2: 98%  Weight: 137 lb (62.1 kg)  Height: '5\' 5"'$  (1.651 m)   Body mass index is 22.8 kg/m.  Gen: resting comfortably, no acute distress HEENT: no scleral icterus, pupils equal round and reactive, no palptable cervical adenopathy,  CV: irreg, no m/r/g, no jvd Resp: Clear to auscultation bilaterally GI: abdomen is soft, non-tender, non-distended, normal bowel sounds, no hepatosplenomegaly MSK: extremities are warm, no edema.  Skin: warm, no rash Neuro:  no focal deficits Psych: appropriate affect   Diagnostic Studies  09/2014 echo Study Conclusions  - Left ventricle: The cavity size was normal. There was severe   asymmetric hypertrophy of the septum. Systolic function was   normal. The estimated ejection fraction was in the range of 60%   to 65%. Wall motion was normal; there were no regional wall   motion abnormalities. - Aortic valve: There was mild regurgitation. - Mitral valve: There was mild  regurgitation directed centrally. - Left atrium: The atrium was moderately to severely dilated. - Tricuspid valve: There was moderate regurgitation. - Pulmonary arteries: Systolic pressure was mildly increased. PA   peak pressure: 41 mm Hg (S).  Impressions:  - Appearance is consistent with hypertrophic cardiomyopathy with no   outflow tract obstruction (at rest).   Assessment and Plan  1.Chronic diastolic HF/ LE edema/Lymphedema -currently off diuretic it appears, not clear which provider stopped it but reasonable to be off, has done fine without it - needs to get her lymphedema pump repaired or replaced, will reach out to that clinic and see if can help arrange.      2. Afib/acquired thrombophilia - some recent palpitations, start toprol 12.'5mg'$  daily - continue eliquis for stroke prevention   3.Pacemaker/Tachy brady syndrome - continue to follow with EP, no symptoms   [name], M.D.

## 2022-01-08 ENCOUNTER — Ambulatory Visit (INDEPENDENT_AMBULATORY_CARE_PROVIDER_SITE_OTHER): Payer: Medicare Other

## 2022-01-08 ENCOUNTER — Telehealth: Payer: Self-pay | Admitting: Cardiology

## 2022-01-08 ENCOUNTER — Encounter: Payer: Self-pay | Admitting: *Deleted

## 2022-01-08 DIAGNOSIS — I495 Sick sinus syndrome: Secondary | ICD-10-CM

## 2022-01-08 NOTE — Telephone Encounter (Addendum)
Informed patient that I have left message with Lymphedema clinic [phone]) & am awaiting a call back.    Pump is not working.

## 2022-01-08 NOTE — Telephone Encounter (Signed)
Pt said she states she was told by Dr. Harl Bowie, he would tell her where to get her boot fixed for her legs. Please advise.

## 2022-01-08 NOTE — Telephone Encounter (Signed)
Spoke with pt's aide Junie Panning) - informed her per Lymphedema clinic that she should call the number on the pump for any repairs needed.  She states that she does not see any phone numbers anywhere on machine.    Phone number given for her to discuss further in detail with the Lymphedema clinic.  She verbalized understanding.

## 2022-01-11 LAB — CUP PACEART REMOTE DEVICE CHECK
Battery Remaining Longevity: 168 mo
Battery Voltage: 3.15 V
Brady Statistic AP VP Percent: 0 %
Brady Statistic AP VS Percent: 0 %
Brady Statistic AS VP Percent: 39.06 %
Brady Statistic AS VS Percent: 60.94 %
Brady Statistic RA Percent Paced: 0 %
Brady Statistic RV Percent Paced: 39.06 %
Date Time Interrogation Session: 20230901022818
Implantable Lead Implant Date: 19980116
Implantable Lead Implant Date: 19980116
Implantable Lead Location: 753859
Implantable Lead Location: 753860
Implantable Lead Model: 5034
Implantable Lead Model: 5534
Implantable Pulse Generator Implant Date: 20220830
Lead Channel Impedance Value: 1254 Ohm
Lead Channel Impedance Value: 836 Ohm
Lead Channel Impedance Value: 988 Ohm
Lead Channel Pacing Threshold Amplitude: 0.625 V
Lead Channel Pacing Threshold Pulse Width: 0.4 ms
Lead Channel Sensing Intrinsic Amplitude: 13 mV
Lead Channel Sensing Intrinsic Amplitude: 13 mV
Lead Channel Sensing Intrinsic Amplitude: 2.25 mV
Lead Channel Setting Pacing Amplitude: 2 V
Lead Channel Setting Pacing Pulse Width: 0.4 ms
Lead Channel Setting Sensing Sensitivity: 5.6 mV

## 2022-01-21 ENCOUNTER — Other Ambulatory Visit: Payer: Self-pay | Admitting: Internal Medicine

## 2022-01-21 DIAGNOSIS — Z1231 Encounter for screening mammogram for malignant neoplasm of breast: Secondary | ICD-10-CM

## 2022-01-22 DIAGNOSIS — E1165 Type 2 diabetes mellitus with hyperglycemia: Secondary | ICD-10-CM | POA: Diagnosis not present

## 2022-01-22 DIAGNOSIS — M25569 Pain in unspecified knee: Secondary | ICD-10-CM | POA: Diagnosis not present

## 2022-01-22 DIAGNOSIS — I10 Essential (primary) hypertension: Secondary | ICD-10-CM | POA: Diagnosis not present

## 2022-01-22 DIAGNOSIS — I4891 Unspecified atrial fibrillation: Secondary | ICD-10-CM | POA: Diagnosis not present

## 2022-01-22 DIAGNOSIS — D1720 Benign lipomatous neoplasm of skin and subcutaneous tissue of unspecified limb: Secondary | ICD-10-CM | POA: Diagnosis not present

## 2022-01-22 DIAGNOSIS — Z299 Encounter for prophylactic measures, unspecified: Secondary | ICD-10-CM | POA: Diagnosis not present

## 2022-01-27 NOTE — Progress Notes (Signed)
Remote pacemaker transmission.   

## 2022-02-10 ENCOUNTER — Telehealth: Payer: Self-pay | Admitting: *Deleted

## 2022-02-10 DIAGNOSIS — D1722 Benign lipomatous neoplasm of skin and subcutaneous tissue of left arm: Secondary | ICD-10-CM | POA: Diagnosis not present

## 2022-02-10 NOTE — Telephone Encounter (Signed)
Dr. Harl Bowie, you recently saw this patient on 01/01/22. May she proceed with lipoma removal under general anesthesia without further cardiac testing?  Please direct your response to p cv div preop.  Thank you, Emmaline Life, NP-C  02/10/2022, 4:17 PM [address] Office [phone] Fax [phone]'

## 2022-02-10 NOTE — Telephone Encounter (Signed)
   Pre-operative Risk Assessment    Patient Name: Megan Walsh  DOB: November 07, 1932 MRN: [id number]      Request for Surgical Clearance    Procedure:   Lipoma Removal from left upper arm  Date of Surgery:  Clearance 03/02/22                                 Surgeon:  Dr. Ethel Rana Surgeon's Group or Practice Name:  [facility] [place] [facility] [place] number:  [phone] Fax number:  [phone]   Type of Clearance Requested:   - Pharmacy:  Hold Apixaban (Eliquis) 2 days prior to her procedure and does she need lovenox bridging?   Type of Anesthesia:  General    Additional requests/questions:    Phineas Inches   02/10/2022, 3:21 PM

## 2022-02-11 NOTE — Telephone Encounter (Signed)
Patient with diagnosis of atrial fibrillation on Eliquis for anticoagulation.    Procedure: lipoma removal from left upper arm Date of procedure: 03/02/22   CHA2DS2-VASc Score = 7   This indicates a 11.2% annual risk of stroke. The patient's score is based upon: CHF History: 1 HTN History: 1 Diabetes History: 1 Stroke History: 0 Vascular Disease History: 1 Age Score: 2 Gender Score: 1    CrCl 37 Platelet count 157  Per office protocol, patient can hold Eliquis for 2 days prior to procedure.   Patient will not need bridging with Lovenox (enoxaparin) around procedure.  **This guidance is not considered finalized until pre-operative APP has relayed final recommendations.**

## 2022-02-11 NOTE — Telephone Encounter (Signed)
Chronic issues were covered during that appiontment and stable, specific operative risk assessment was not done. Would need preop appointment  Zandra Abts MD

## 2022-02-11 NOTE — Telephone Encounter (Signed)
     Primary Cardiologist: Carlyle Dolly, MD  Chart reviewed as part of pre-operative protocol coverage. Given past medical history and time since last visit, based on ACC/AHA guidelines, Megan Walsh would be at acceptable risk for the planned procedure without further cardiovascular testing.   Patient with diagnosis of atrial fibrillation on Eliquis for anticoagulation.     Procedure: lipoma removal from left upper arm Date of procedure: 03/02/22     CHA2DS2-VASc Score = 7   This indicates a 11.2% annual risk of stroke. The patient's score is based upon: CHF History: 1 HTN History: 1 Diabetes History: 1 Stroke History: 0 Vascular Disease History: 1 Age Score: 2 Gender Score: 1     CrCl 37 Platelet count 157   Per office protocol, patient can hold Eliquis for 2 days prior to procedure.   Patient will not need bridging with Lovenox (enoxaparin) around procedure.  I will route this recommendation to the requesting party via Epic fax function and remove from pre-op pool.  Please call with questions.  Jossie Ng. Orlene Salmons NP-C     02/11/2022, 12:30 PM [address] Office [phone] Fax [phone]

## 2022-03-01 DIAGNOSIS — D1722 Benign lipomatous neoplasm of skin and subcutaneous tissue of left arm: Secondary | ICD-10-CM | POA: Diagnosis not present

## 2022-03-02 DIAGNOSIS — I130 Hypertensive heart and chronic kidney disease with heart failure and stage 1 through stage 4 chronic kidney disease, or unspecified chronic kidney disease: Secondary | ICD-10-CM | POA: Diagnosis not present

## 2022-03-02 DIAGNOSIS — I252 Old myocardial infarction: Secondary | ICD-10-CM | POA: Diagnosis not present

## 2022-03-02 DIAGNOSIS — I2510 Atherosclerotic heart disease of native coronary artery without angina pectoris: Secondary | ICD-10-CM | POA: Diagnosis not present

## 2022-03-02 DIAGNOSIS — N183 Chronic kidney disease, stage 3 unspecified: Secondary | ICD-10-CM | POA: Diagnosis not present

## 2022-03-02 DIAGNOSIS — I4891 Unspecified atrial fibrillation: Secondary | ICD-10-CM | POA: Diagnosis not present

## 2022-03-02 DIAGNOSIS — E1140 Type 2 diabetes mellitus with diabetic neuropathy, unspecified: Secondary | ICD-10-CM | POA: Diagnosis not present

## 2022-03-02 DIAGNOSIS — M109 Gout, unspecified: Secondary | ICD-10-CM | POA: Diagnosis not present

## 2022-03-02 DIAGNOSIS — M1990 Unspecified osteoarthritis, unspecified site: Secondary | ICD-10-CM | POA: Diagnosis not present

## 2022-03-02 DIAGNOSIS — N2581 Secondary hyperparathyroidism of renal origin: Secondary | ICD-10-CM | POA: Diagnosis not present

## 2022-03-02 DIAGNOSIS — Z79899 Other long term (current) drug therapy: Secondary | ICD-10-CM | POA: Diagnosis not present

## 2022-03-02 DIAGNOSIS — E559 Vitamin D deficiency, unspecified: Secondary | ICD-10-CM | POA: Diagnosis not present

## 2022-03-02 DIAGNOSIS — D1722 Benign lipomatous neoplasm of skin and subcutaneous tissue of left arm: Secondary | ICD-10-CM | POA: Diagnosis not present

## 2022-03-02 DIAGNOSIS — E1122 Type 2 diabetes mellitus with diabetic chronic kidney disease: Secondary | ICD-10-CM | POA: Diagnosis not present

## 2022-03-02 DIAGNOSIS — E7800 Pure hypercholesterolemia, unspecified: Secondary | ICD-10-CM | POA: Diagnosis not present

## 2022-03-02 DIAGNOSIS — Z885 Allergy status to narcotic agent status: Secondary | ICD-10-CM | POA: Diagnosis not present

## 2022-03-02 DIAGNOSIS — R001 Bradycardia, unspecified: Secondary | ICD-10-CM | POA: Diagnosis not present

## 2022-03-02 DIAGNOSIS — G4730 Sleep apnea, unspecified: Secondary | ICD-10-CM | POA: Diagnosis not present

## 2022-03-02 DIAGNOSIS — Z950 Presence of cardiac pacemaker: Secondary | ICD-10-CM | POA: Diagnosis not present

## 2022-03-02 DIAGNOSIS — Z7901 Long term (current) use of anticoagulants: Secondary | ICD-10-CM | POA: Diagnosis not present

## 2022-03-02 DIAGNOSIS — I495 Sick sinus syndrome: Secondary | ICD-10-CM | POA: Diagnosis not present

## 2022-03-02 DIAGNOSIS — I5032 Chronic diastolic (congestive) heart failure: Secondary | ICD-10-CM | POA: Diagnosis not present

## 2022-03-02 DIAGNOSIS — Z87891 Personal history of nicotine dependence: Secondary | ICD-10-CM | POA: Diagnosis not present

## 2022-03-11 DIAGNOSIS — H40023 Open angle with borderline findings, high risk, bilateral: Secondary | ICD-10-CM | POA: Diagnosis not present

## 2022-03-26 DIAGNOSIS — I5022 Chronic systolic (congestive) heart failure: Secondary | ICD-10-CM | POA: Diagnosis not present

## 2022-03-26 DIAGNOSIS — Z79899 Other long term (current) drug therapy: Secondary | ICD-10-CM | POA: Diagnosis not present

## 2022-03-26 DIAGNOSIS — N2581 Secondary hyperparathyroidism of renal origin: Secondary | ICD-10-CM | POA: Diagnosis not present

## 2022-03-26 DIAGNOSIS — S51802A Unspecified open wound of left forearm, initial encounter: Secondary | ICD-10-CM | POA: Diagnosis not present

## 2022-03-26 DIAGNOSIS — I130 Hypertensive heart and chronic kidney disease with heart failure and stage 1 through stage 4 chronic kidney disease, or unspecified chronic kidney disease: Secondary | ICD-10-CM | POA: Diagnosis not present

## 2022-03-26 DIAGNOSIS — Z885 Allergy status to narcotic agent status: Secondary | ICD-10-CM | POA: Diagnosis not present

## 2022-03-26 DIAGNOSIS — L7631 Postprocedural hematoma of skin and subcutaneous tissue following a dermatologic procedure: Secondary | ICD-10-CM | POA: Diagnosis not present

## 2022-03-26 DIAGNOSIS — E1140 Type 2 diabetes mellitus with diabetic neuropathy, unspecified: Secondary | ICD-10-CM | POA: Diagnosis not present

## 2022-03-26 DIAGNOSIS — I4891 Unspecified atrial fibrillation: Secondary | ICD-10-CM | POA: Diagnosis not present

## 2022-03-26 DIAGNOSIS — Z87891 Personal history of nicotine dependence: Secondary | ICD-10-CM | POA: Diagnosis not present

## 2022-03-26 DIAGNOSIS — M109 Gout, unspecified: Secondary | ICD-10-CM | POA: Diagnosis not present

## 2022-03-26 DIAGNOSIS — Z7901 Long term (current) use of anticoagulants: Secondary | ICD-10-CM | POA: Diagnosis not present

## 2022-03-26 DIAGNOSIS — I2510 Atherosclerotic heart disease of native coronary artery without angina pectoris: Secondary | ICD-10-CM | POA: Diagnosis not present

## 2022-03-26 DIAGNOSIS — E1122 Type 2 diabetes mellitus with diabetic chronic kidney disease: Secondary | ICD-10-CM | POA: Diagnosis not present

## 2022-03-26 DIAGNOSIS — Z888 Allergy status to other drugs, medicaments and biological substances status: Secondary | ICD-10-CM | POA: Diagnosis not present

## 2022-03-26 DIAGNOSIS — E7800 Pure hypercholesterolemia, unspecified: Secondary | ICD-10-CM | POA: Diagnosis not present

## 2022-03-26 DIAGNOSIS — Z950 Presence of cardiac pacemaker: Secondary | ICD-10-CM | POA: Diagnosis not present

## 2022-03-26 DIAGNOSIS — D1722 Benign lipomatous neoplasm of skin and subcutaneous tissue of left arm: Secondary | ICD-10-CM | POA: Diagnosis not present

## 2022-03-26 DIAGNOSIS — N183 Chronic kidney disease, stage 3 unspecified: Secondary | ICD-10-CM | POA: Diagnosis not present

## 2022-03-27 ENCOUNTER — Telehealth: Payer: Self-pay | Admitting: Student

## 2022-03-27 NOTE — Telephone Encounter (Signed)
   Patient Answering Service with reports of pain around pacemaker site. Called and spoke with patient. She reports that she started having a "funny" feeling in her chest this morning "on the side with her pacemaker." She was initially concerned that this could be related to her pacemaker and states she has not had her pacemaker checked in a while. She has difficulty describing this pain and states it just feels funny. It does not sound like it is directly at pacemaker site and is just on the left side. She then states it feels a little like reflux. She has not tried any over the counter reflux medications. She denies any associated symptoms - no shortness of breath, nausea, palpitations, lightheadedness/dizziness. BP and HR this morning were 154/73 and 67 bpm respectively. Nothing seems to make the discomfort better or worse. Discussed that she can try over the counter reflux medications to see if that helps but that if that does not help and she is still having active chest pain or she develops any other symptoms, then she should be seen more urgently in the Emergency Department. Patient voiced understanding and agreed.  Will route this message to schedulers to triage pools in [place] to see if we can get her a follow-up visit in the next week or so.  Darreld Mclean, PA-C 03/27/2022 10:02 AM

## 2022-03-31 DIAGNOSIS — Z4800 Encounter for change or removal of nonsurgical wound dressing: Secondary | ICD-10-CM | POA: Diagnosis not present

## 2022-04-04 ENCOUNTER — Other Ambulatory Visit: Payer: Self-pay | Admitting: Cardiology

## 2022-04-05 DIAGNOSIS — E7800 Pure hypercholesterolemia, unspecified: Secondary | ICD-10-CM | POA: Diagnosis not present

## 2022-04-05 DIAGNOSIS — N2581 Secondary hyperparathyroidism of renal origin: Secondary | ICD-10-CM | POA: Diagnosis not present

## 2022-04-05 DIAGNOSIS — I5022 Chronic systolic (congestive) heart failure: Secondary | ICD-10-CM | POA: Diagnosis not present

## 2022-04-05 DIAGNOSIS — I2510 Atherosclerotic heart disease of native coronary artery without angina pectoris: Secondary | ICD-10-CM | POA: Diagnosis not present

## 2022-04-05 DIAGNOSIS — Z885 Allergy status to narcotic agent status: Secondary | ICD-10-CM | POA: Diagnosis not present

## 2022-04-05 DIAGNOSIS — Z950 Presence of cardiac pacemaker: Secondary | ICD-10-CM | POA: Diagnosis not present

## 2022-04-05 DIAGNOSIS — M109 Gout, unspecified: Secondary | ICD-10-CM | POA: Diagnosis not present

## 2022-04-05 DIAGNOSIS — D1722 Benign lipomatous neoplasm of skin and subcutaneous tissue of left arm: Secondary | ICD-10-CM | POA: Diagnosis not present

## 2022-04-05 DIAGNOSIS — Z87891 Personal history of nicotine dependence: Secondary | ICD-10-CM | POA: Diagnosis not present

## 2022-04-05 DIAGNOSIS — N183 Chronic kidney disease, stage 3 unspecified: Secondary | ICD-10-CM | POA: Diagnosis not present

## 2022-04-05 DIAGNOSIS — I4891 Unspecified atrial fibrillation: Secondary | ICD-10-CM | POA: Diagnosis not present

## 2022-04-05 DIAGNOSIS — E1122 Type 2 diabetes mellitus with diabetic chronic kidney disease: Secondary | ICD-10-CM | POA: Diagnosis not present

## 2022-04-05 DIAGNOSIS — Z7901 Long term (current) use of anticoagulants: Secondary | ICD-10-CM | POA: Diagnosis not present

## 2022-04-05 DIAGNOSIS — L7631 Postprocedural hematoma of skin and subcutaneous tissue following a dermatologic procedure: Secondary | ICD-10-CM | POA: Diagnosis not present

## 2022-04-05 DIAGNOSIS — Z888 Allergy status to other drugs, medicaments and biological substances status: Secondary | ICD-10-CM | POA: Diagnosis not present

## 2022-04-05 DIAGNOSIS — E1140 Type 2 diabetes mellitus with diabetic neuropathy, unspecified: Secondary | ICD-10-CM | POA: Diagnosis not present

## 2022-04-05 DIAGNOSIS — I130 Hypertensive heart and chronic kidney disease with heart failure and stage 1 through stage 4 chronic kidney disease, or unspecified chronic kidney disease: Secondary | ICD-10-CM | POA: Diagnosis not present

## 2022-04-05 DIAGNOSIS — Z79899 Other long term (current) drug therapy: Secondary | ICD-10-CM | POA: Diagnosis not present

## 2022-04-05 NOTE — Telephone Encounter (Signed)
Prescription refill request for Eliquis received. Indication: AF Last office visit: 01/01/22  Zandra Abts MD Scr: 1.16 on 03/01/22 Age: 88 Weight: 59kg  Based on above findings Eliquis 2.'5mg'$  twice daily is the appropriate dose.  Refill approved.

## 2022-04-06 ENCOUNTER — Ambulatory Visit: Payer: Medicare Other | Attending: Internal Medicine | Admitting: Internal Medicine

## 2022-04-06 VITALS — BP 128/70 | HR 60 | Ht 65.0 in | Wt 132.0 lb

## 2022-04-06 DIAGNOSIS — I4821 Permanent atrial fibrillation: Secondary | ICD-10-CM | POA: Diagnosis not present

## 2022-04-06 MED ORDER — METOPROLOL SUCCINATE ER 25 MG PO TB24: 25.0000 mg | ORAL_TABLET | Freq: Every day | ORAL | 3 refills | Status: DC

## 2022-04-06 NOTE — Progress Notes (Signed)
HPI Megan Walsh is a 88 y.o. female who presents today for routine electrophysiology followup.  Since her generator change, the patient reports doing very well except for some palpitations and sob and rare lightheadedness which appears to correlate with RVR on her PPM.    Allergies  Allergen Reactions   Atorvastatin Swelling and Cough     Current Outpatient Medications  Medication Sig Dispense Refill   acetaminophen (TYLENOL) 650 MG CR tablet Take 650 mg by mouth 2 (two) times daily as needed for pain.      allopurinol (ZYLOPRIM) 300 MG tablet Take 300 mg by mouth daily.     apixaban (ELIQUIS) 2.5 MG TABS tablet TAKE 1 TABLET BY MOUTH TWICE DAILY 60 tablet 5   ascorbic acid (VITAMIN C) 500 MG tablet Take 500 mg by mouth daily.     DULoxetine (CYMBALTA) 20 MG capsule Take 20 mg by mouth daily.     HYDROcodone-acetaminophen (NORCO/VICODIN) 5-325 MG tablet Take 1 tablet by mouth every 6 (six) hours as needed for severe pain.     metoprolol succinate (TOPROL XL) 25 MG 24 hr tablet Take 0.5 tablets (12.5 mg total) by mouth daily. 15 tablet 6   potassium chloride SA (K-DUR,KLOR-CON) 20 MEQ tablet Take 20 mEq by mouth daily.     simvastatin (ZOCOR) 40 MG tablet Take 40 mg by mouth at bedtime.     traMADol (ULTRAM) 50 MG tablet Take 50-100 mg by mouth every 6 (six) hours as needed.     triamcinolone cream (KENALOG) 0.1 % Apply 1 application topically 2 (two) times daily as needed.     No current facility-administered medications for this visit.     Past Medical History:  Diagnosis Date   Anxiety    Arthritis    CHF (congestive heart failure) (HCC)    Coronary artery disease    Diabetes mellitus without complication (HCC)    Dysrhythmia    a-fib   Fatty tumor    History of gout    Hypertension    Myocardial infarction [facility])    Permanent atrial fibrillation ([place])    Presence of permanent cardiac pacemaker    MDT ADDR01/Implanted: 05/31/08   Sleep apnea    cannot  tolerate CPAP   Symptomatic bradycardia     ROS:   All systems reviewed and negative except as noted in the HPI.   Past Surgical History:  Procedure Laterality Date   ABDOMINAL HYSTERECTOMY     BROW LIFT Bilateral 03/10/2018   Procedure: BLEPHAROPLASTY BILATERAL UPPER EYELIDS;  Surgeon: Baruch Goldmann, MD;  Location: [facility];  Service: Ophthalmology;  Laterality: Bilateral;   CATARACT EXTRACTION W/PHACO Left 10/17/2017   Procedure: CATARACT EXTRACTION PHACO AND INTRAOCULAR LENS PLACEMENT LEFT EYE;  Surgeon: Tonny Branch, MD;  Location: [facility];  Service: Ophthalmology;  Laterality: Left;  CDE: 11.64   CATARACT EXTRACTION W/PHACO Right 10/31/2017   Procedure: CATARACT EXTRACTION PHACO AND INTRAOCULAR LENS PLACEMENT RIGHT EYE;  Surgeon: Tonny Branch, MD;  Location: [facility];  Service: Ophthalmology;  Laterality: Right;  CDE: 11.19   fatty tumor removal to left arm Right    PACEMAKER GENERATOR CHANGE  05/31/08   at [facility]: MDT ADDR01/Implanted: 05/31/08 as gen change by Dr Reinaldo Berber   PACEMAKER INSERTION  1992   PPM GENERATOR CHANGEOUT N/A 01/06/2021   Procedure: PPM GENERATOR CHANGEOUT;  Surgeon: Thompson Grayer, MD;  Location: [place] [facility];  Service: Cardiovascular;  Laterality: N/A;     Family History  Problem Relation Age of Onset   Heart failure Mother      Social History   Socioeconomic History   Marital status: Single    Spouse name: Not on file   Number of children: Not on file   Years of education: Not on file   Highest education level: Not on file  Occupational History   Not on file  Tobacco Use   Smoking status: Former    Packs/day: 1.00    Years: 30.00    Total pack years: 30.00    Types: Cigarettes    Start date: 08/22/1944    Quit date: 05/10/1974    Years since quitting: 47.9   Smokeless tobacco: Never  Vaping Use   Vaping Use: Never used  Substance and Sexual Activity   Alcohol use: No    Alcohol/week: 0.0 standard drinks of alcohol   Drug use: No   Sexual  activity: Never  Other Topics Concern   Not on file  Social History Narrative   Lives in [place] Determinants of Health   Financial Resource Strain: Not on file  Food Insecurity: Not on file  Transportation Needs: Not on file  Physical Activity: Not on file  Stress: Not on file  Social Connections: Not on file  Intimate Partner Violence: Not on file     BP 128/70 (BP Location: Right Arm, Patient Position: Sitting, Cuff Size: Normal)   Pulse 60   Ht '5\' 5"'$  (1.651 m)   Wt 132 lb (59.9 kg)   BMI 21.97 kg/m   Physical Exam:  Well appearing NAD HEENT: Unremarkable Neck:  No JVD, no thyromegally Lymphatics:  No adenopathy Back:  No CVA tenderness Lungs:  Clear HEART:  Regular rate rhythm, no murmurs, no rubs, no clicks Abd:  soft, positive bowel sounds, no organomegally, no rebound, no guarding Ext:  2 plus pulses, no edema, no cyanosis, no clubbing Skin:  No rashes no nodules Neuro:  CN II through XII intact, motor grossly intact  EKG  DEVICE  Normal device function.  See PaceArt for details.   Assess/Plan:  Symptomatic second degree heart block Normal pacemaker function See Pace Art report No changes today she is not device dependant today   2. Permanent afib On coumadin for chads2vasc score of 7 Mostly rate controlled but with occasional RVR I have asked her to increase her dose of toprol.  She may require digoxin but will increase the toprol  Today.   3. Chronic diastolic dysfunction Stable No change required today  Salome Spotted

## 2022-04-06 NOTE — Patient Instructions (Signed)
Medication Instructions:   Increase Toprol XL to 25 mg Daily   *If you need a refill on your cardiac medications before your next appointment, please call your pharmacy*   Lab Work: NONE   If you have labs (blood work) drawn today and your tests are completely normal, you will receive your results only by: [place] (if you have MyChart) OR A paper copy in the mail If you have any lab test that is abnormal or we need to change your treatment, we will call you to review the results.   Testing/Procedures: NONE    Follow-Up: At [facility], you and your health needs are our priority.  As part of our continuing mission to provide you with exceptional heart care, we have created designated Provider Care Teams.  These Care Teams include your primary Cardiologist (physician) and Advanced Practice Providers (APPs -  Physician Assistants and Nurse Practitioners) who all work together to provide you with the care you need, when you need it.  We recommend signing up for the patient portal called "MyChart".  Sign up information is provided on this After Visit Summary.  MyChart is used to connect with patients for Virtual Visits (Telemedicine).  Patients are able to view lab/test results, encounter notes, upcoming appointments, etc.  Non-urgent messages can be sent to your provider as well.   To learn more about what you can do with MyChart, go to [email or web address].    Your next appointment:    As Scheduled   The format for your next appointment:   In Person  Provider:   Doralee Albino, MD    Other Instructions Thank you for choosing [facility]!    Important Information About Sugar

## 2022-04-09 ENCOUNTER — Ambulatory Visit (INDEPENDENT_AMBULATORY_CARE_PROVIDER_SITE_OTHER): Payer: Medicare Other

## 2022-04-09 DIAGNOSIS — I495 Sick sinus syndrome: Secondary | ICD-10-CM

## 2022-04-09 DIAGNOSIS — Z950 Presence of cardiac pacemaker: Secondary | ICD-10-CM

## 2022-04-10 LAB — CUP PACEART REMOTE DEVICE CHECK
Battery Remaining Longevity: 165 mo
Battery Voltage: 3.11 V
Brady Statistic AP VP Percent: 0 %
Brady Statistic AP VS Percent: 0 %
Brady Statistic AS VP Percent: 69.72 %
Brady Statistic AS VS Percent: 30.28 %
Brady Statistic RA Percent Paced: 0 %
Brady Statistic RV Percent Paced: 69.72 %
Date Time Interrogation Session: 20231201001659
Implantable Lead Connection Status: 753985
Implantable Lead Connection Status: 753985
Implantable Lead Implant Date: 19980116
Implantable Lead Implant Date: 19980116
Implantable Lead Location: 753859
Implantable Lead Location: 753860
Implantable Lead Model: 5034
Implantable Lead Model: 5534
Implantable Pulse Generator Implant Date: 20220830
Lead Channel Impedance Value: 1083 Ohm
Lead Channel Impedance Value: 1235 Ohm
Lead Channel Impedance Value: 1311 Ohm
Lead Channel Impedance Value: 950 Ohm
Lead Channel Pacing Threshold Amplitude: 0.5 V
Lead Channel Pacing Threshold Pulse Width: 0.4 ms
Lead Channel Sensing Intrinsic Amplitude: 15.375 mV
Lead Channel Sensing Intrinsic Amplitude: 15.375 mV
Lead Channel Sensing Intrinsic Amplitude: 2.25 mV
Lead Channel Setting Pacing Amplitude: 2 V
Lead Channel Setting Pacing Pulse Width: 0.4 ms
Lead Channel Setting Sensing Sensitivity: 5.6 mV
Zone Setting Status: 755011

## 2022-04-12 DIAGNOSIS — M109 Gout, unspecified: Secondary | ICD-10-CM | POA: Diagnosis not present

## 2022-04-12 DIAGNOSIS — D1722 Benign lipomatous neoplasm of skin and subcutaneous tissue of left arm: Secondary | ICD-10-CM | POA: Diagnosis not present

## 2022-04-12 DIAGNOSIS — I4891 Unspecified atrial fibrillation: Secondary | ICD-10-CM | POA: Diagnosis not present

## 2022-04-12 DIAGNOSIS — L7631 Postprocedural hematoma of skin and subcutaneous tissue following a dermatologic procedure: Secondary | ICD-10-CM | POA: Diagnosis not present

## 2022-04-12 DIAGNOSIS — I5022 Chronic systolic (congestive) heart failure: Secondary | ICD-10-CM | POA: Diagnosis not present

## 2022-04-12 DIAGNOSIS — I130 Hypertensive heart and chronic kidney disease with heart failure and stage 1 through stage 4 chronic kidney disease, or unspecified chronic kidney disease: Secondary | ICD-10-CM | POA: Diagnosis not present

## 2022-04-12 DIAGNOSIS — N2581 Secondary hyperparathyroidism of renal origin: Secondary | ICD-10-CM | POA: Diagnosis not present

## 2022-04-12 DIAGNOSIS — E1122 Type 2 diabetes mellitus with diabetic chronic kidney disease: Secondary | ICD-10-CM | POA: Diagnosis not present

## 2022-04-12 DIAGNOSIS — E7800 Pure hypercholesterolemia, unspecified: Secondary | ICD-10-CM | POA: Diagnosis not present

## 2022-04-12 DIAGNOSIS — Z950 Presence of cardiac pacemaker: Secondary | ICD-10-CM | POA: Diagnosis not present

## 2022-04-12 DIAGNOSIS — Z87891 Personal history of nicotine dependence: Secondary | ICD-10-CM | POA: Diagnosis not present

## 2022-04-12 DIAGNOSIS — Z79899 Other long term (current) drug therapy: Secondary | ICD-10-CM | POA: Diagnosis not present

## 2022-04-12 DIAGNOSIS — I2510 Atherosclerotic heart disease of native coronary artery without angina pectoris: Secondary | ICD-10-CM | POA: Diagnosis not present

## 2022-04-12 DIAGNOSIS — Z7901 Long term (current) use of anticoagulants: Secondary | ICD-10-CM | POA: Diagnosis not present

## 2022-04-12 DIAGNOSIS — E1140 Type 2 diabetes mellitus with diabetic neuropathy, unspecified: Secondary | ICD-10-CM | POA: Diagnosis not present

## 2022-04-12 DIAGNOSIS — N183 Chronic kidney disease, stage 3 unspecified: Secondary | ICD-10-CM | POA: Diagnosis not present

## 2022-04-12 DIAGNOSIS — Z885 Allergy status to narcotic agent status: Secondary | ICD-10-CM | POA: Diagnosis not present

## 2022-04-12 DIAGNOSIS — Z888 Allergy status to other drugs, medicaments and biological substances status: Secondary | ICD-10-CM | POA: Diagnosis not present

## 2022-04-15 DIAGNOSIS — E1122 Type 2 diabetes mellitus with diabetic chronic kidney disease: Secondary | ICD-10-CM | POA: Diagnosis not present

## 2022-04-15 DIAGNOSIS — Z950 Presence of cardiac pacemaker: Secondary | ICD-10-CM | POA: Diagnosis not present

## 2022-04-15 DIAGNOSIS — E1140 Type 2 diabetes mellitus with diabetic neuropathy, unspecified: Secondary | ICD-10-CM | POA: Diagnosis not present

## 2022-04-15 DIAGNOSIS — I130 Hypertensive heart and chronic kidney disease with heart failure and stage 1 through stage 4 chronic kidney disease, or unspecified chronic kidney disease: Secondary | ICD-10-CM | POA: Diagnosis not present

## 2022-04-15 DIAGNOSIS — E7800 Pure hypercholesterolemia, unspecified: Secondary | ICD-10-CM | POA: Diagnosis not present

## 2022-04-15 DIAGNOSIS — Z7901 Long term (current) use of anticoagulants: Secondary | ICD-10-CM | POA: Diagnosis not present

## 2022-04-15 DIAGNOSIS — N183 Chronic kidney disease, stage 3 unspecified: Secondary | ICD-10-CM | POA: Diagnosis not present

## 2022-04-15 DIAGNOSIS — Z888 Allergy status to other drugs, medicaments and biological substances status: Secondary | ICD-10-CM | POA: Diagnosis not present

## 2022-04-15 DIAGNOSIS — Z885 Allergy status to narcotic agent status: Secondary | ICD-10-CM | POA: Diagnosis not present

## 2022-04-15 DIAGNOSIS — D1722 Benign lipomatous neoplasm of skin and subcutaneous tissue of left arm: Secondary | ICD-10-CM | POA: Diagnosis not present

## 2022-04-15 DIAGNOSIS — I4891 Unspecified atrial fibrillation: Secondary | ICD-10-CM | POA: Diagnosis not present

## 2022-04-15 DIAGNOSIS — M109 Gout, unspecified: Secondary | ICD-10-CM | POA: Diagnosis not present

## 2022-04-15 DIAGNOSIS — Z79899 Other long term (current) drug therapy: Secondary | ICD-10-CM | POA: Diagnosis not present

## 2022-04-15 DIAGNOSIS — I5022 Chronic systolic (congestive) heart failure: Secondary | ICD-10-CM | POA: Diagnosis not present

## 2022-04-15 DIAGNOSIS — N2581 Secondary hyperparathyroidism of renal origin: Secondary | ICD-10-CM | POA: Diagnosis not present

## 2022-04-15 DIAGNOSIS — Z87891 Personal history of nicotine dependence: Secondary | ICD-10-CM | POA: Diagnosis not present

## 2022-04-15 DIAGNOSIS — I2510 Atherosclerotic heart disease of native coronary artery without angina pectoris: Secondary | ICD-10-CM | POA: Diagnosis not present

## 2022-04-15 DIAGNOSIS — L7631 Postprocedural hematoma of skin and subcutaneous tissue following a dermatologic procedure: Secondary | ICD-10-CM | POA: Diagnosis not present

## 2022-04-19 DIAGNOSIS — Z950 Presence of cardiac pacemaker: Secondary | ICD-10-CM | POA: Diagnosis not present

## 2022-04-19 DIAGNOSIS — E1122 Type 2 diabetes mellitus with diabetic chronic kidney disease: Secondary | ICD-10-CM | POA: Diagnosis not present

## 2022-04-19 DIAGNOSIS — Z79899 Other long term (current) drug therapy: Secondary | ICD-10-CM | POA: Diagnosis not present

## 2022-04-19 DIAGNOSIS — N183 Chronic kidney disease, stage 3 unspecified: Secondary | ICD-10-CM | POA: Diagnosis not present

## 2022-04-19 DIAGNOSIS — E7800 Pure hypercholesterolemia, unspecified: Secondary | ICD-10-CM | POA: Diagnosis not present

## 2022-04-19 DIAGNOSIS — Z7901 Long term (current) use of anticoagulants: Secondary | ICD-10-CM | POA: Diagnosis not present

## 2022-04-19 DIAGNOSIS — I130 Hypertensive heart and chronic kidney disease with heart failure and stage 1 through stage 4 chronic kidney disease, or unspecified chronic kidney disease: Secondary | ICD-10-CM | POA: Diagnosis not present

## 2022-04-19 DIAGNOSIS — L7631 Postprocedural hematoma of skin and subcutaneous tissue following a dermatologic procedure: Secondary | ICD-10-CM | POA: Diagnosis not present

## 2022-04-19 DIAGNOSIS — M109 Gout, unspecified: Secondary | ICD-10-CM | POA: Diagnosis not present

## 2022-04-19 DIAGNOSIS — D1722 Benign lipomatous neoplasm of skin and subcutaneous tissue of left arm: Secondary | ICD-10-CM | POA: Diagnosis not present

## 2022-04-19 DIAGNOSIS — I2510 Atherosclerotic heart disease of native coronary artery without angina pectoris: Secondary | ICD-10-CM | POA: Diagnosis not present

## 2022-04-19 DIAGNOSIS — I4891 Unspecified atrial fibrillation: Secondary | ICD-10-CM | POA: Diagnosis not present

## 2022-04-19 DIAGNOSIS — N2581 Secondary hyperparathyroidism of renal origin: Secondary | ICD-10-CM | POA: Diagnosis not present

## 2022-04-19 DIAGNOSIS — Z885 Allergy status to narcotic agent status: Secondary | ICD-10-CM | POA: Diagnosis not present

## 2022-04-19 DIAGNOSIS — Z87891 Personal history of nicotine dependence: Secondary | ICD-10-CM | POA: Diagnosis not present

## 2022-04-19 DIAGNOSIS — Z888 Allergy status to other drugs, medicaments and biological substances status: Secondary | ICD-10-CM | POA: Diagnosis not present

## 2022-04-19 DIAGNOSIS — I5022 Chronic systolic (congestive) heart failure: Secondary | ICD-10-CM | POA: Diagnosis not present

## 2022-04-19 DIAGNOSIS — E1140 Type 2 diabetes mellitus with diabetic neuropathy, unspecified: Secondary | ICD-10-CM | POA: Diagnosis not present

## 2022-04-23 DIAGNOSIS — I5022 Chronic systolic (congestive) heart failure: Secondary | ICD-10-CM | POA: Diagnosis not present

## 2022-04-23 DIAGNOSIS — E1140 Type 2 diabetes mellitus with diabetic neuropathy, unspecified: Secondary | ICD-10-CM | POA: Diagnosis not present

## 2022-04-23 DIAGNOSIS — E7800 Pure hypercholesterolemia, unspecified: Secondary | ICD-10-CM | POA: Diagnosis not present

## 2022-04-23 DIAGNOSIS — Z87891 Personal history of nicotine dependence: Secondary | ICD-10-CM | POA: Diagnosis not present

## 2022-04-23 DIAGNOSIS — N183 Chronic kidney disease, stage 3 unspecified: Secondary | ICD-10-CM | POA: Diagnosis not present

## 2022-04-23 DIAGNOSIS — M109 Gout, unspecified: Secondary | ICD-10-CM | POA: Diagnosis not present

## 2022-04-23 DIAGNOSIS — I130 Hypertensive heart and chronic kidney disease with heart failure and stage 1 through stage 4 chronic kidney disease, or unspecified chronic kidney disease: Secondary | ICD-10-CM | POA: Diagnosis not present

## 2022-04-23 DIAGNOSIS — Z950 Presence of cardiac pacemaker: Secondary | ICD-10-CM | POA: Diagnosis not present

## 2022-04-23 DIAGNOSIS — Z888 Allergy status to other drugs, medicaments and biological substances status: Secondary | ICD-10-CM | POA: Diagnosis not present

## 2022-04-23 DIAGNOSIS — I4891 Unspecified atrial fibrillation: Secondary | ICD-10-CM | POA: Diagnosis not present

## 2022-04-23 DIAGNOSIS — L7631 Postprocedural hematoma of skin and subcutaneous tissue following a dermatologic procedure: Secondary | ICD-10-CM | POA: Diagnosis not present

## 2022-04-23 DIAGNOSIS — N2581 Secondary hyperparathyroidism of renal origin: Secondary | ICD-10-CM | POA: Diagnosis not present

## 2022-04-23 DIAGNOSIS — I2510 Atherosclerotic heart disease of native coronary artery without angina pectoris: Secondary | ICD-10-CM | POA: Diagnosis not present

## 2022-04-23 DIAGNOSIS — Z885 Allergy status to narcotic agent status: Secondary | ICD-10-CM | POA: Diagnosis not present

## 2022-04-23 DIAGNOSIS — E1122 Type 2 diabetes mellitus with diabetic chronic kidney disease: Secondary | ICD-10-CM | POA: Diagnosis not present

## 2022-04-23 DIAGNOSIS — Z79899 Other long term (current) drug therapy: Secondary | ICD-10-CM | POA: Diagnosis not present

## 2022-04-23 DIAGNOSIS — S40022D Contusion of left upper arm, subsequent encounter: Secondary | ICD-10-CM | POA: Diagnosis not present

## 2022-04-23 DIAGNOSIS — Z7901 Long term (current) use of anticoagulants: Secondary | ICD-10-CM | POA: Diagnosis not present

## 2022-04-23 DIAGNOSIS — D1722 Benign lipomatous neoplasm of skin and subcutaneous tissue of left arm: Secondary | ICD-10-CM | POA: Diagnosis not present

## 2022-04-26 DIAGNOSIS — I509 Heart failure, unspecified: Secondary | ICD-10-CM | POA: Diagnosis not present

## 2022-04-26 DIAGNOSIS — N189 Chronic kidney disease, unspecified: Secondary | ICD-10-CM | POA: Diagnosis not present

## 2022-04-26 DIAGNOSIS — Z7901 Long term (current) use of anticoagulants: Secondary | ICD-10-CM | POA: Diagnosis not present

## 2022-04-26 DIAGNOSIS — Z87891 Personal history of nicotine dependence: Secondary | ICD-10-CM | POA: Diagnosis not present

## 2022-04-26 DIAGNOSIS — M109 Gout, unspecified: Secondary | ICD-10-CM | POA: Diagnosis not present

## 2022-04-26 DIAGNOSIS — Z950 Presence of cardiac pacemaker: Secondary | ICD-10-CM | POA: Diagnosis not present

## 2022-04-26 DIAGNOSIS — I2510 Atherosclerotic heart disease of native coronary artery without angina pectoris: Secondary | ICD-10-CM | POA: Diagnosis not present

## 2022-04-26 DIAGNOSIS — S40022D Contusion of left upper arm, subsequent encounter: Secondary | ICD-10-CM | POA: Diagnosis not present

## 2022-04-26 DIAGNOSIS — E1140 Type 2 diabetes mellitus with diabetic neuropathy, unspecified: Secondary | ICD-10-CM | POA: Diagnosis not present

## 2022-04-26 DIAGNOSIS — E7800 Pure hypercholesterolemia, unspecified: Secondary | ICD-10-CM | POA: Diagnosis not present

## 2022-04-26 DIAGNOSIS — I130 Hypertensive heart and chronic kidney disease with heart failure and stage 1 through stage 4 chronic kidney disease, or unspecified chronic kidney disease: Secondary | ICD-10-CM | POA: Diagnosis not present

## 2022-04-26 DIAGNOSIS — E1122 Type 2 diabetes mellitus with diabetic chronic kidney disease: Secondary | ICD-10-CM | POA: Diagnosis not present

## 2022-04-26 IMAGING — CT CT CERVICAL SPINE W/O CM
2 of 3 series · 13 of 33 positions shown, 16 images · non-contrast
Comparison: None.

CLINICAL DATA: Neck pain for approximately 2 months. No known
injury.

EXAM:
CT CERVICAL SPINE WITHOUT CONTRAST
TECHNIQUE: Multidetector CT imaging of the cervical spine was performed without
intravenous contrast. Multiplanar CT image reconstructions were also
generated.

[Series 4: c spine soft · axial · 0.46mm/px · z∈[+1178,+1304]mm · 8 of 75 slices shown, 10 images]
[im 6/75  soft-tissue]
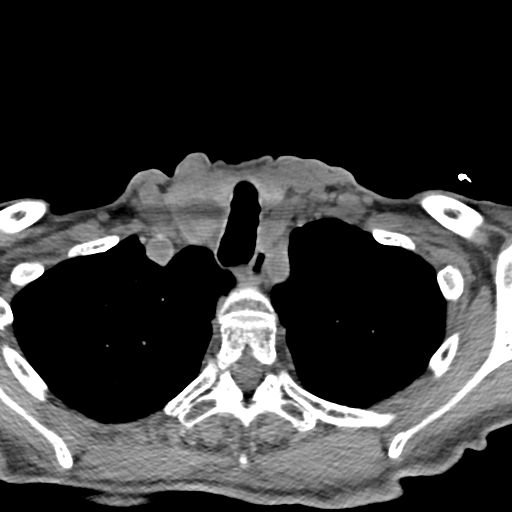
[im 6/75  bone]
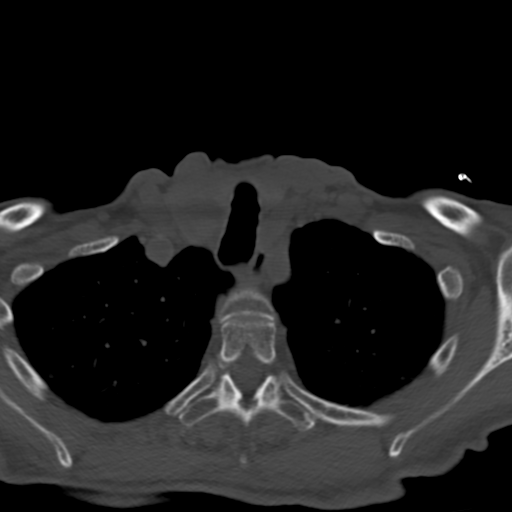
[im 18/75  bone]
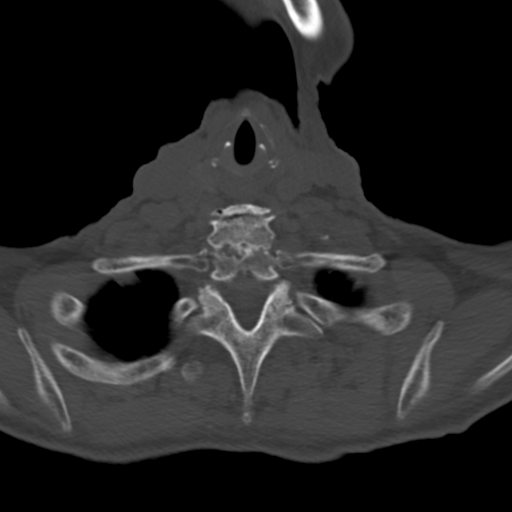
[im 23/75  bone]
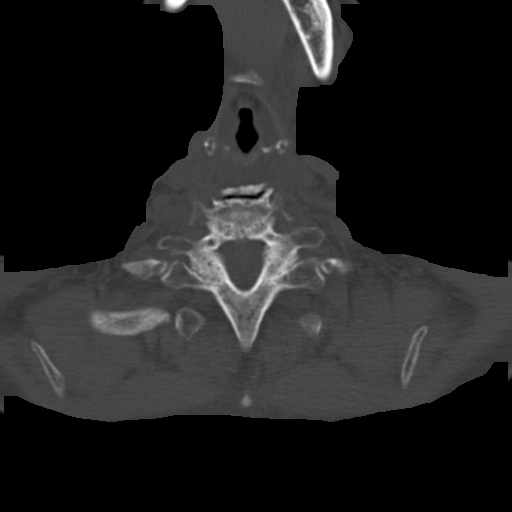
[im 35/75  bone]
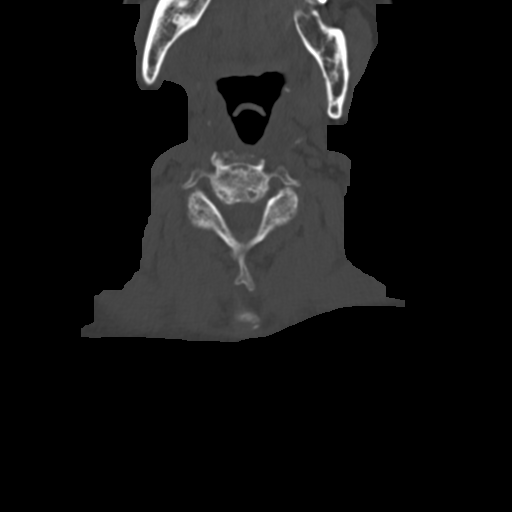
[im 40/75  soft-tissue]
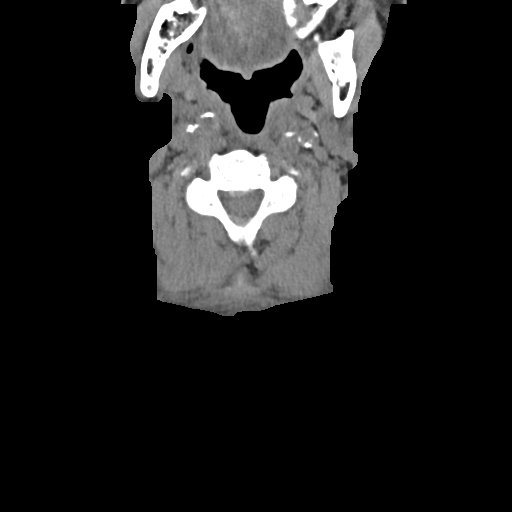
[im 40/75  bone]
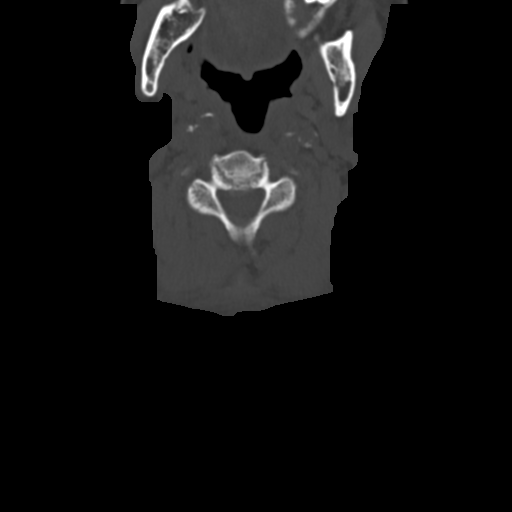
[im 52/75  bone]
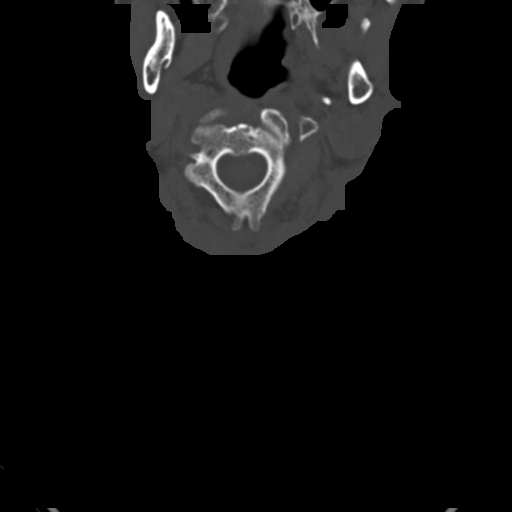
[im 57/75  bone]
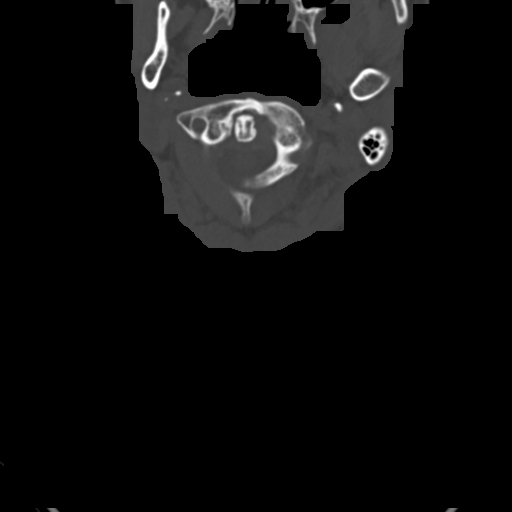
[im 69/75  bone]
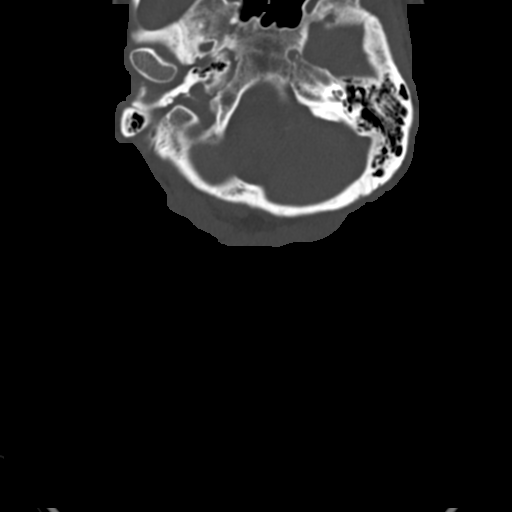

[Series 5: sag bone · sagittal · 0.29mm/px · 5 of 61 slices shown, 6 images]
[im 21/61  bone]
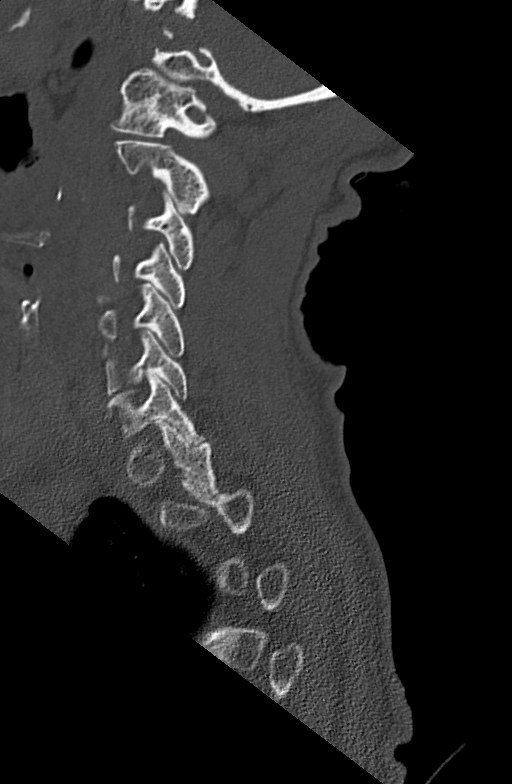
[im 26/61  bone]
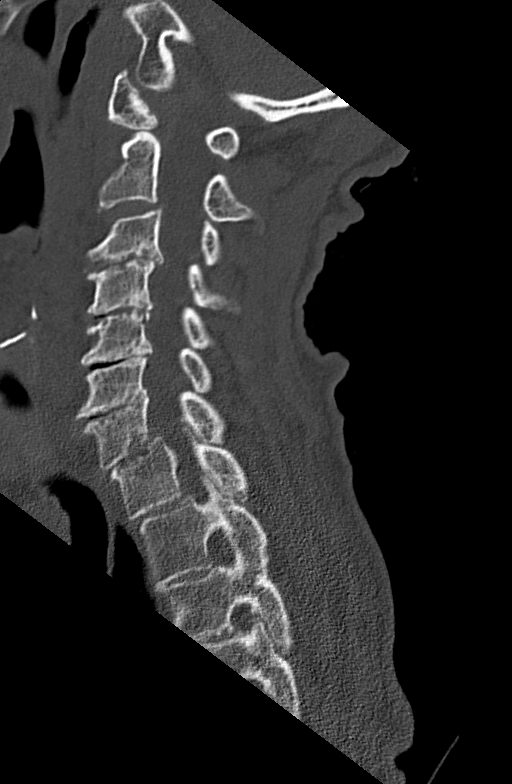
[im 31/61  soft-tissue]
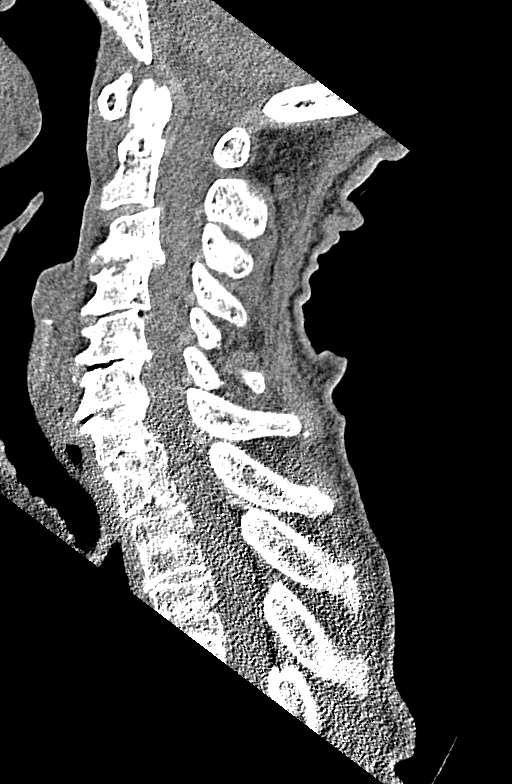
[im 31/61  bone]
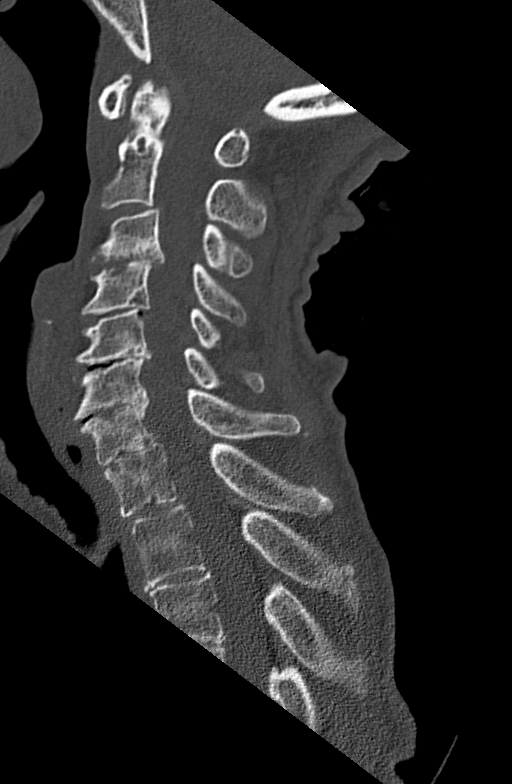
[im 36/61  bone]
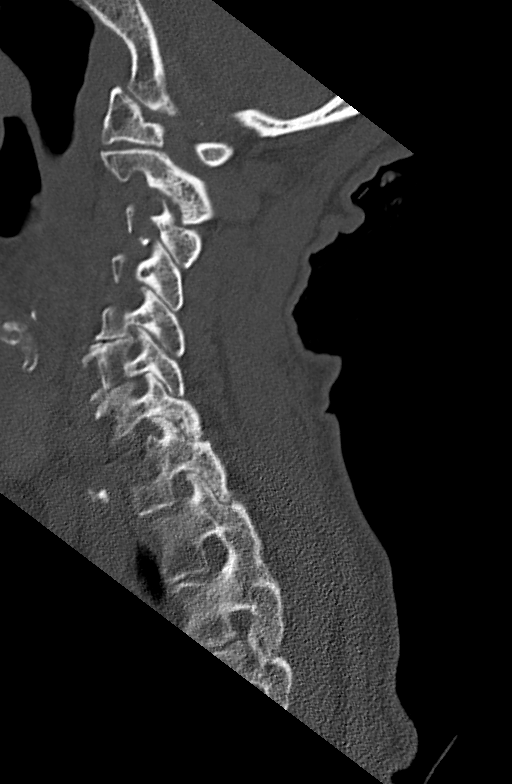
[im 41/61  bone]
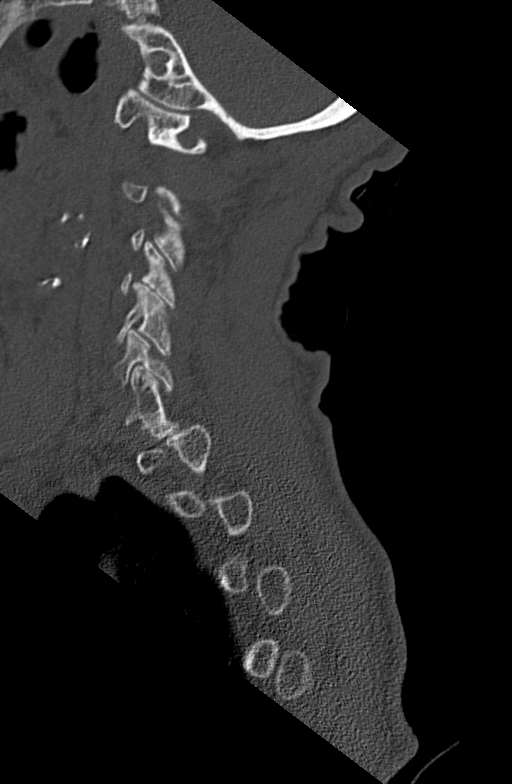

[13 of 33 positions shown; findings below may reference images not displayed]

FINDINGS: Alignment: 0.2 cm retrolisthesis C3 on C4, trace retrolisthesis C4
on C5 and C5 on C6. 0.2 cm anterolisthesis C7 on T1.

Skull base and vertebrae: No acute fracture. No primary bone lesion
or focal pathologic process.

Soft tissues and spinal canal: No prevertebral fluid or swelling. No
visible canal hematoma.

Disc levels: Severe loss of disc space height is seen from C3-T1.
Scattered facet degenerative disease is worst on the left at C2-3
and bilaterally at C7-T1.

Upper chest: Lung apices are clear.

Other: None.
IMPRESSION: No acute finding.

Severe multilevel degenerative disease.

## 2022-04-27 NOTE — Progress Notes (Signed)
Remote pacemaker transmission.   

## 2022-04-28 ENCOUNTER — Ambulatory Visit: Payer: Medicare Other | Admitting: Cardiology

## 2022-04-29 DIAGNOSIS — M109 Gout, unspecified: Secondary | ICD-10-CM | POA: Diagnosis not present

## 2022-04-29 DIAGNOSIS — E1140 Type 2 diabetes mellitus with diabetic neuropathy, unspecified: Secondary | ICD-10-CM | POA: Diagnosis not present

## 2022-04-29 DIAGNOSIS — I2510 Atherosclerotic heart disease of native coronary artery without angina pectoris: Secondary | ICD-10-CM | POA: Diagnosis not present

## 2022-04-29 DIAGNOSIS — I130 Hypertensive heart and chronic kidney disease with heart failure and stage 1 through stage 4 chronic kidney disease, or unspecified chronic kidney disease: Secondary | ICD-10-CM | POA: Diagnosis not present

## 2022-04-29 DIAGNOSIS — I509 Heart failure, unspecified: Secondary | ICD-10-CM | POA: Diagnosis not present

## 2022-04-29 DIAGNOSIS — Z87891 Personal history of nicotine dependence: Secondary | ICD-10-CM | POA: Diagnosis not present

## 2022-04-29 DIAGNOSIS — E1122 Type 2 diabetes mellitus with diabetic chronic kidney disease: Secondary | ICD-10-CM | POA: Diagnosis not present

## 2022-04-29 DIAGNOSIS — S40022D Contusion of left upper arm, subsequent encounter: Secondary | ICD-10-CM | POA: Diagnosis not present

## 2022-04-29 DIAGNOSIS — Z950 Presence of cardiac pacemaker: Secondary | ICD-10-CM | POA: Diagnosis not present

## 2022-04-29 DIAGNOSIS — Z7901 Long term (current) use of anticoagulants: Secondary | ICD-10-CM | POA: Diagnosis not present

## 2022-04-29 DIAGNOSIS — E7800 Pure hypercholesterolemia, unspecified: Secondary | ICD-10-CM | POA: Diagnosis not present

## 2022-04-29 DIAGNOSIS — N189 Chronic kidney disease, unspecified: Secondary | ICD-10-CM | POA: Diagnosis not present

## 2022-05-01 DIAGNOSIS — R5381 Other malaise: Secondary | ICD-10-CM | POA: Diagnosis not present

## 2022-05-01 DIAGNOSIS — W19XXXA Unspecified fall, initial encounter: Secondary | ICD-10-CM | POA: Diagnosis not present

## 2022-05-01 DIAGNOSIS — R531 Weakness: Secondary | ICD-10-CM | POA: Diagnosis not present

## 2022-05-04 DIAGNOSIS — M109 Gout, unspecified: Secondary | ICD-10-CM | POA: Diagnosis not present

## 2022-05-04 DIAGNOSIS — E1140 Type 2 diabetes mellitus with diabetic neuropathy, unspecified: Secondary | ICD-10-CM | POA: Diagnosis not present

## 2022-05-04 DIAGNOSIS — Z7901 Long term (current) use of anticoagulants: Secondary | ICD-10-CM | POA: Diagnosis not present

## 2022-05-04 DIAGNOSIS — E1122 Type 2 diabetes mellitus with diabetic chronic kidney disease: Secondary | ICD-10-CM | POA: Diagnosis not present

## 2022-05-04 DIAGNOSIS — N189 Chronic kidney disease, unspecified: Secondary | ICD-10-CM | POA: Diagnosis not present

## 2022-05-04 DIAGNOSIS — S40022D Contusion of left upper arm, subsequent encounter: Secondary | ICD-10-CM | POA: Diagnosis not present

## 2022-05-04 DIAGNOSIS — I130 Hypertensive heart and chronic kidney disease with heart failure and stage 1 through stage 4 chronic kidney disease, or unspecified chronic kidney disease: Secondary | ICD-10-CM | POA: Diagnosis not present

## 2022-05-04 DIAGNOSIS — I2510 Atherosclerotic heart disease of native coronary artery without angina pectoris: Secondary | ICD-10-CM | POA: Diagnosis not present

## 2022-05-04 DIAGNOSIS — E7800 Pure hypercholesterolemia, unspecified: Secondary | ICD-10-CM | POA: Diagnosis not present

## 2022-05-04 DIAGNOSIS — Z950 Presence of cardiac pacemaker: Secondary | ICD-10-CM | POA: Diagnosis not present

## 2022-05-04 DIAGNOSIS — I509 Heart failure, unspecified: Secondary | ICD-10-CM | POA: Diagnosis not present

## 2022-05-04 DIAGNOSIS — Z87891 Personal history of nicotine dependence: Secondary | ICD-10-CM | POA: Diagnosis not present

## 2022-05-07 DIAGNOSIS — E1122 Type 2 diabetes mellitus with diabetic chronic kidney disease: Secondary | ICD-10-CM | POA: Diagnosis not present

## 2022-05-07 DIAGNOSIS — N189 Chronic kidney disease, unspecified: Secondary | ICD-10-CM | POA: Diagnosis not present

## 2022-05-07 DIAGNOSIS — I509 Heart failure, unspecified: Secondary | ICD-10-CM | POA: Diagnosis not present

## 2022-05-07 DIAGNOSIS — Z7901 Long term (current) use of anticoagulants: Secondary | ICD-10-CM | POA: Diagnosis not present

## 2022-05-07 DIAGNOSIS — Z87891 Personal history of nicotine dependence: Secondary | ICD-10-CM | POA: Diagnosis not present

## 2022-05-07 DIAGNOSIS — S40022D Contusion of left upper arm, subsequent encounter: Secondary | ICD-10-CM | POA: Diagnosis not present

## 2022-05-07 DIAGNOSIS — E7800 Pure hypercholesterolemia, unspecified: Secondary | ICD-10-CM | POA: Diagnosis not present

## 2022-05-07 DIAGNOSIS — I2510 Atherosclerotic heart disease of native coronary artery without angina pectoris: Secondary | ICD-10-CM | POA: Diagnosis not present

## 2022-05-07 DIAGNOSIS — Z950 Presence of cardiac pacemaker: Secondary | ICD-10-CM | POA: Diagnosis not present

## 2022-05-07 DIAGNOSIS — M109 Gout, unspecified: Secondary | ICD-10-CM | POA: Diagnosis not present

## 2022-05-07 DIAGNOSIS — I130 Hypertensive heart and chronic kidney disease with heart failure and stage 1 through stage 4 chronic kidney disease, or unspecified chronic kidney disease: Secondary | ICD-10-CM | POA: Diagnosis not present

## 2022-05-07 DIAGNOSIS — E1140 Type 2 diabetes mellitus with diabetic neuropathy, unspecified: Secondary | ICD-10-CM | POA: Diagnosis not present

## 2022-05-14 ENCOUNTER — Encounter: Payer: Medicare Other | Admitting: Internal Medicine

## 2022-05-28 ENCOUNTER — Encounter: Payer: Self-pay | Admitting: Cardiovascular Disease

## 2022-05-28 ENCOUNTER — Ambulatory Visit: Payer: 59 | Attending: Internal Medicine | Admitting: Cardiovascular Disease

## 2022-05-28 VITALS — BP 122/56 | HR 75 | Ht 64.0 in | Wt 132.2 lb

## 2022-05-28 DIAGNOSIS — I495 Sick sinus syndrome: Secondary | ICD-10-CM

## 2022-05-28 DIAGNOSIS — E7800 Pure hypercholesterolemia, unspecified: Secondary | ICD-10-CM | POA: Diagnosis not present

## 2022-05-28 DIAGNOSIS — E1140 Type 2 diabetes mellitus with diabetic neuropathy, unspecified: Secondary | ICD-10-CM | POA: Diagnosis not present

## 2022-05-28 DIAGNOSIS — E1122 Type 2 diabetes mellitus with diabetic chronic kidney disease: Secondary | ICD-10-CM | POA: Diagnosis not present

## 2022-05-28 DIAGNOSIS — Z87891 Personal history of nicotine dependence: Secondary | ICD-10-CM | POA: Diagnosis not present

## 2022-05-28 DIAGNOSIS — S40022A Contusion of left upper arm, initial encounter: Secondary | ICD-10-CM | POA: Diagnosis not present

## 2022-05-28 DIAGNOSIS — I509 Heart failure, unspecified: Secondary | ICD-10-CM | POA: Diagnosis not present

## 2022-05-28 DIAGNOSIS — Z79899 Other long term (current) drug therapy: Secondary | ICD-10-CM | POA: Diagnosis not present

## 2022-05-28 DIAGNOSIS — M109 Gout, unspecified: Secondary | ICD-10-CM | POA: Diagnosis not present

## 2022-05-28 DIAGNOSIS — N189 Chronic kidney disease, unspecified: Secondary | ICD-10-CM | POA: Diagnosis not present

## 2022-05-28 DIAGNOSIS — I130 Hypertensive heart and chronic kidney disease with heart failure and stage 1 through stage 4 chronic kidney disease, or unspecified chronic kidney disease: Secondary | ICD-10-CM | POA: Diagnosis not present

## 2022-05-28 DIAGNOSIS — Z7901 Long term (current) use of anticoagulants: Secondary | ICD-10-CM | POA: Diagnosis not present

## 2022-05-28 DIAGNOSIS — I4891 Unspecified atrial fibrillation: Secondary | ICD-10-CM | POA: Diagnosis not present

## 2022-05-28 DIAGNOSIS — Z950 Presence of cardiac pacemaker: Secondary | ICD-10-CM

## 2022-05-28 DIAGNOSIS — I2510 Atherosclerotic heart disease of native coronary artery without angina pectoris: Secondary | ICD-10-CM | POA: Diagnosis not present

## 2022-05-28 DIAGNOSIS — N2581 Secondary hyperparathyroidism of renal origin: Secondary | ICD-10-CM | POA: Diagnosis not present

## 2022-05-28 DIAGNOSIS — L7631 Postprocedural hematoma of skin and subcutaneous tissue following a dermatologic procedure: Secondary | ICD-10-CM | POA: Diagnosis not present

## 2022-05-28 NOTE — Patient Instructions (Signed)
Medication Instructions:  Continue all current medications.  Labwork: none  Testing/Procedures: none  Follow-Up: 1 year - Dr.  Mealor    Any Other Special Instructions Will Be Listed Below (If Applicable).   If you need a refill on your cardiac medications before your next appointment, please call your pharmacy.  

## 2022-05-28 NOTE — Progress Notes (Signed)
PCP: Monico Blitz, MD Primary Cardiologist: Dr Harl Bowie Primary EP:  DrMealor  Megan Walsh is a 89 y.o. female who presents today for routine electrophysiology followup.  Since her generator change, the patient reports doing very well.  Today, she denies symptoms of palpitations, chest pain, shortness of breath,  lower extremity edema, dizziness, presyncope, or syncope.  The patient is otherwise without complaint today.   Past Medical History:  Diagnosis Date   Anxiety    Arthritis    CHF (congestive heart failure) ([place])    Coronary artery disease    Diabetes mellitus without complication ([place])    Dysrhythmia    a-fib   Fatty tumor    History of gout    Hypertension    Myocardial infarction [facility])    Permanent atrial fibrillation ([place])    Presence of permanent cardiac pacemaker    MDT ADDR01/Implanted: 05/31/08   Sleep apnea    cannot tolerate CPAP   Symptomatic bradycardia    Past Surgical History:  Procedure Laterality Date   ABDOMINAL HYSTERECTOMY     BROW LIFT Bilateral 03/10/2018   Procedure: BLEPHAROPLASTY BILATERAL UPPER EYELIDS;  Surgeon: Baruch Goldmann, MD;  Location: [facility];  Service: Ophthalmology;  Laterality: Bilateral;   CATARACT EXTRACTION W/PHACO Left 10/17/2017   Procedure: CATARACT EXTRACTION PHACO AND INTRAOCULAR LENS PLACEMENT LEFT EYE;  Surgeon: Tonny Branch, MD;  Location: [facility];  Service: Ophthalmology;  Laterality: Left;  CDE: 11.64   CATARACT EXTRACTION W/PHACO Right 10/31/2017   Procedure: CATARACT EXTRACTION PHACO AND INTRAOCULAR LENS PLACEMENT RIGHT EYE;  Surgeon: Tonny Branch, MD;  Location: [facility];  Service: Ophthalmology;  Laterality: Right;  CDE: 11.19   fatty tumor removal to left arm Right    PACEMAKER GENERATOR CHANGE  05/31/08   at [facility]: MDT ADDR01/Implanted: 05/31/08 as gen change by Dr Reinaldo Berber   PACEMAKER INSERTION  1992   PPM GENERATOR CHANGEOUT N/A 01/06/2021   Procedure: PPM GENERATOR CHANGEOUT;  Surgeon: Thompson Grayer, MD;  Location:  [place] [facility];  Service: Cardiovascular;  Laterality: N/A;    ROS- all systems are reviewed and negative except as per HPI above  Current Outpatient Medications  Medication Sig Dispense Refill   acetaminophen (TYLENOL) 650 MG CR tablet Take 650 mg by mouth 2 (two) times daily as needed for pain.      allopurinol (ZYLOPRIM) 300 MG tablet Take 300 mg by mouth daily.     apixaban (ELIQUIS) 2.5 MG TABS tablet TAKE 1 TABLET BY MOUTH TWICE DAILY 60 tablet 5   ascorbic acid (VITAMIN C) 500 MG tablet Take 500 mg by mouth daily.     DULoxetine (CYMBALTA) 30 MG capsule Take 30 mg by mouth daily.     metoprolol succinate (TOPROL XL) 25 MG 24 hr tablet Take 1 tablet (25 mg total) by mouth daily. 90 tablet 3   potassium chloride SA (K-DUR,KLOR-CON) 20 MEQ tablet Take 20 mEq by mouth daily.     simvastatin (ZOCOR) 40 MG tablet Take 40 mg by mouth at bedtime.     traMADol (ULTRAM) 50 MG tablet Take 50-100 mg by mouth every 6 (six) hours as needed.     triamcinolone cream (KENALOG) 0.1 % Apply 1 application topically 2 (two) times daily as needed.     HYDROcodone-acetaminophen (NORCO/VICODIN) 5-325 MG tablet Take 1 tablet by mouth every 6 (six) hours as needed for severe pain. (Patient not taking: Reported on 05/28/2022)     No current facility-administered medications for this visit.  Physical Exam: Vitals:   05/28/22 1017  BP: (!) 122/56  Pulse: 75  SpO2: 98%  Weight: 132 lb 3.2 oz (60 kg)  Height: '5\' 4"'$  (1.626 m)    Gen: Appears comfortable, well-nourished CV: RRR, no dependent edema The device site is normal -- no tenderness, edema, drainage, redness, threatened erosion.  Pulm: breathing easily   Pacemaker interrogation- reviewed in detail today,  See PACEART report  Assessment and Plan:  1. Symptomatic second degree heart block Normal pacemaker function See Pace Art report No changes today she is not device dependant today  2. Permanent afib On coumadin for chads2vasc  score of 7 Mostly rate controlled but with occasional RVR  3. Chronic diastolic dysfunction Stable No change required today  Return in a year  Melida Quitter, MD 05/28/2022 10:29 AM

## 2022-06-02 LAB — CUP PACEART INCLINIC DEVICE CHECK
Battery Remaining Longevity: 163 mo
Battery Voltage: 3.09 V
Brady Statistic AP VP Percent: 0 %
Brady Statistic AP VS Percent: 0 %
Brady Statistic AS VP Percent: 52.5 %
Brady Statistic AS VS Percent: 47.5 %
Brady Statistic RA Percent Paced: 0 %
Brady Statistic RV Percent Paced: 52.5 %
Date Time Interrogation Session: 20240119104100
Implantable Lead Connection Status: 753985
Implantable Lead Connection Status: 753985
Implantable Lead Implant Date: 19980116
Implantable Lead Implant Date: 19980116
Implantable Lead Location: 753859
Implantable Lead Location: 753860
Implantable Lead Model: 5034
Implantable Lead Model: 5534
Implantable Pulse Generator Implant Date: 20220830
Lead Channel Impedance Value: 1235 Ohm
Lead Channel Impedance Value: 1311 Ohm
Lead Channel Impedance Value: 779 Ohm
Lead Channel Impedance Value: 912 Ohm
Lead Channel Pacing Threshold Amplitude: 0.5 V
Lead Channel Pacing Threshold Pulse Width: 0.4 ms
Lead Channel Sensing Intrinsic Amplitude: 12.625 mV
Lead Channel Sensing Intrinsic Amplitude: 18.75 mV
Lead Channel Sensing Intrinsic Amplitude: 2.25 mV
Lead Channel Setting Pacing Amplitude: 2 V
Lead Channel Setting Pacing Pulse Width: 0.4 ms
Lead Channel Setting Sensing Sensitivity: 5.6 mV
Zone Setting Status: 755011

## 2022-06-11 DIAGNOSIS — M109 Gout, unspecified: Secondary | ICD-10-CM | POA: Diagnosis not present

## 2022-06-11 DIAGNOSIS — S40022D Contusion of left upper arm, subsequent encounter: Secondary | ICD-10-CM | POA: Diagnosis not present

## 2022-06-11 DIAGNOSIS — N2581 Secondary hyperparathyroidism of renal origin: Secondary | ICD-10-CM | POA: Diagnosis not present

## 2022-06-11 DIAGNOSIS — L7631 Postprocedural hematoma of skin and subcutaneous tissue following a dermatologic procedure: Secondary | ICD-10-CM | POA: Diagnosis not present

## 2022-06-11 DIAGNOSIS — I130 Hypertensive heart and chronic kidney disease with heart failure and stage 1 through stage 4 chronic kidney disease, or unspecified chronic kidney disease: Secondary | ICD-10-CM | POA: Diagnosis not present

## 2022-06-11 DIAGNOSIS — I4891 Unspecified atrial fibrillation: Secondary | ICD-10-CM | POA: Diagnosis not present

## 2022-06-11 DIAGNOSIS — Z950 Presence of cardiac pacemaker: Secondary | ICD-10-CM | POA: Diagnosis not present

## 2022-06-11 DIAGNOSIS — E1122 Type 2 diabetes mellitus with diabetic chronic kidney disease: Secondary | ICD-10-CM | POA: Diagnosis not present

## 2022-06-11 DIAGNOSIS — Z87891 Personal history of nicotine dependence: Secondary | ICD-10-CM | POA: Diagnosis not present

## 2022-06-11 DIAGNOSIS — N189 Chronic kidney disease, unspecified: Secondary | ICD-10-CM | POA: Diagnosis not present

## 2022-06-11 DIAGNOSIS — Z7901 Long term (current) use of anticoagulants: Secondary | ICD-10-CM | POA: Diagnosis not present

## 2022-06-11 DIAGNOSIS — I509 Heart failure, unspecified: Secondary | ICD-10-CM | POA: Diagnosis not present

## 2022-06-11 DIAGNOSIS — E1140 Type 2 diabetes mellitus with diabetic neuropathy, unspecified: Secondary | ICD-10-CM | POA: Diagnosis not present

## 2022-06-11 DIAGNOSIS — E7800 Pure hypercholesterolemia, unspecified: Secondary | ICD-10-CM | POA: Diagnosis not present

## 2022-06-11 DIAGNOSIS — I2510 Atherosclerotic heart disease of native coronary artery without angina pectoris: Secondary | ICD-10-CM | POA: Diagnosis not present

## 2022-06-11 DIAGNOSIS — Z79899 Other long term (current) drug therapy: Secondary | ICD-10-CM | POA: Diagnosis not present

## 2022-06-14 DIAGNOSIS — D1720 Benign lipomatous neoplasm of skin and subcutaneous tissue of unspecified limb: Secondary | ICD-10-CM | POA: Diagnosis not present

## 2022-06-14 DIAGNOSIS — G309 Alzheimer's disease, unspecified: Secondary | ICD-10-CM | POA: Diagnosis not present

## 2022-06-14 DIAGNOSIS — E1122 Type 2 diabetes mellitus with diabetic chronic kidney disease: Secondary | ICD-10-CM | POA: Diagnosis not present

## 2022-06-24 DIAGNOSIS — E1165 Type 2 diabetes mellitus with hyperglycemia: Secondary | ICD-10-CM | POA: Diagnosis not present

## 2022-06-24 DIAGNOSIS — E1122 Type 2 diabetes mellitus with diabetic chronic kidney disease: Secondary | ICD-10-CM | POA: Diagnosis not present

## 2022-06-24 DIAGNOSIS — Z0000 Encounter for general adult medical examination without abnormal findings: Secondary | ICD-10-CM | POA: Diagnosis not present

## 2022-06-24 DIAGNOSIS — N183 Chronic kidney disease, stage 3 unspecified: Secondary | ICD-10-CM | POA: Diagnosis not present

## 2022-06-24 DIAGNOSIS — I10 Essential (primary) hypertension: Secondary | ICD-10-CM | POA: Diagnosis not present

## 2022-06-24 DIAGNOSIS — Z1389 Encounter for screening for other disorder: Secondary | ICD-10-CM | POA: Diagnosis not present

## 2022-06-24 DIAGNOSIS — Z299 Encounter for prophylactic measures, unspecified: Secondary | ICD-10-CM | POA: Diagnosis not present

## 2022-06-24 DIAGNOSIS — Z7189 Other specified counseling: Secondary | ICD-10-CM | POA: Diagnosis not present

## 2022-07-09 ENCOUNTER — Ambulatory Visit: Payer: 59

## 2022-07-09 DIAGNOSIS — I495 Sick sinus syndrome: Secondary | ICD-10-CM | POA: Diagnosis not present

## 2022-07-09 LAB — CUP PACEART REMOTE DEVICE CHECK
Battery Remaining Longevity: 162 mo
Battery Voltage: 3.08 V
Brady Statistic AP VP Percent: 0 %
Brady Statistic AP VS Percent: 0 %
Brady Statistic AS VP Percent: 31.37 %
Brady Statistic AS VS Percent: 68.63 %
Brady Statistic RA Percent Paced: 0 %
Brady Statistic RV Percent Paced: 31.37 %
Date Time Interrogation Session: 20240301082747
Implantable Lead Connection Status: 753985
Implantable Lead Connection Status: 753985
Implantable Lead Implant Date: 19980116
Implantable Lead Implant Date: 19980116
Implantable Lead Location: 753859
Implantable Lead Location: 753860
Implantable Lead Model: 5034
Implantable Lead Model: 5534
Implantable Pulse Generator Implant Date: 20220830
Lead Channel Impedance Value: 1121 Ohm
Lead Channel Impedance Value: 1235 Ohm
Lead Channel Impedance Value: 1311 Ohm
Lead Channel Impedance Value: 969 Ohm
Lead Channel Pacing Threshold Amplitude: 0.5 V
Lead Channel Pacing Threshold Pulse Width: 0.4 ms
Lead Channel Sensing Intrinsic Amplitude: 14.875 mV
Lead Channel Sensing Intrinsic Amplitude: 14.875 mV
Lead Channel Sensing Intrinsic Amplitude: 2.25 mV
Lead Channel Setting Pacing Amplitude: 2 V
Lead Channel Setting Pacing Pulse Width: 0.4 ms
Lead Channel Setting Sensing Sensitivity: 5.6 mV
Zone Setting Status: 755011

## 2022-07-19 ENCOUNTER — Ambulatory Visit: Payer: 59 | Attending: Cardiology | Admitting: Cardiology

## 2022-07-19 ENCOUNTER — Encounter: Payer: Self-pay | Admitting: Cardiology

## 2022-07-19 VITALS — BP 122/66 | HR 77 | Ht 64.0 in | Wt 130.4 lb

## 2022-07-19 DIAGNOSIS — I4891 Unspecified atrial fibrillation: Secondary | ICD-10-CM

## 2022-07-19 DIAGNOSIS — I5032 Chronic diastolic (congestive) heart failure: Secondary | ICD-10-CM

## 2022-07-19 DIAGNOSIS — I890 Lymphedema, not elsewhere classified: Secondary | ICD-10-CM

## 2022-07-19 DIAGNOSIS — E109 Type 1 diabetes mellitus without complications: Secondary | ICD-10-CM | POA: Diagnosis not present

## 2022-07-19 DIAGNOSIS — I495 Sick sinus syndrome: Secondary | ICD-10-CM | POA: Diagnosis not present

## 2022-07-19 NOTE — Patient Instructions (Signed)
Medication Instructions:  Continue all current medications.   Labwork: none  Testing/Procedures: none  Follow-Up: 6 months   Any Other Special Instructions Will Be Listed Below (If Applicable).   If you need a refill on your cardiac medications before your next appointment, please call your pharmacy.  

## 2022-07-19 NOTE — Progress Notes (Signed)
Clinical Summary Ms. Rampino is a 89 y.o.female seen today for follow up of the following medical problems.    1. Chronic diastolic HF/Lymphedema/Leg swelling - admit to [facility] 07/2017 with fluid overload -07/2017 echo showed LVEF 55-60%. Diuresed with improved symptoms    - completed lymphedema clinic in 01/2018. Has home compression stockings and pump.       - lymphedema pump broke, she is not sure how to get repaired or replaced   - some recent LE edema - has prn lasix, which helps.    2. Permanent pacemaker - no recent symptoms - 07/09/22 normal device function     3. Afib - no recent palpitations - compliant with meds.     4. Hypertrophic CM - echo with severe asymmetric septal hypertrophy in 2016, no gradient.  - no symptoms - advanced age and asymptomatic have not pursued further workup or management.      5. Hyperlipidemia -recent labs by pcp - she is on simvastatin  - 12/2021 TC 159 TG 62 HDL 88 LDL 59   6. CKD - previusly followed by Dr Theador Hawthorne - recently has been just mild CKD Past Medical History:  Diagnosis Date   Anxiety    Arthritis    CHF (congestive heart failure) ([place])    Coronary artery disease    Diabetes mellitus without complication (H[facility])    Dysrhythmia    a-fib   Fatty tumor    History of gout    Hypertension    Myocardial infarction [facility])    Permanent atrial fibrillation ([facility])    Presence of permanent cardiac pacemaker    MDT ADDR01/Implanted: 05/31/08   Sleep apnea    cannot tolerate CPAP   Symptomatic bradycardia      Allergies  Allergen Reactions   Atorvastatin Swelling and Cough   Hydrocodone Itching     Current Outpatient Medications  Medication Sig Dispense Refill   acetaminophen (TYLENOL) 650 MG CR tablet Take 650 mg by mouth 2 (two) times daily as needed for pain.      allopurinol (ZYLOPRIM) 300 MG tablet Take 300 mg by mouth daily.     apixaban (ELIQUIS) 2.5 MG TABS tablet TAKE 1 TABLET BY MOUTH TWICE  DAILY 60 tablet 5   ascorbic acid (VITAMIN C) 500 MG tablet Take 500 mg by mouth daily.     DULoxetine (CYMBALTA) 30 MG capsule Take 30 mg by mouth daily.     HYDROcodone-acetaminophen (NORCO/VICODIN) 5-325 MG tablet Take 1 tablet by mouth every 6 (six) hours as needed for severe pain. (Patient not taking: Reported on 05/28/2022)     metoprolol succinate (TOPROL XL) 25 MG 24 hr tablet Take 1 tablet (25 mg total) by mouth daily. 90 tablet 3   potassium chloride SA (K-DUR,KLOR-CON) 20 MEQ tablet Take 20 mEq by mouth daily.     simvastatin (ZOCOR) 40 MG tablet Take 40 mg by mouth at bedtime.     traMADol (ULTRAM) 50 MG tablet Take 50-100 mg by mouth every 6 (six) hours as needed.     triamcinolone cream (KENALOG) 0.1 % Apply 1 application topically 2 (two) times daily as needed.     No current facility-administered medications for this visit.     Past Surgical History:  Procedure Laterality Date   ABDOMINAL HYSTERECTOMY     BROW LIFT Bilateral 03/10/2018   Procedure: BLEPHAROPLASTY BILATERAL UPPER EYELIDS;  Surgeon: Baruch Goldmann, MD;  Location: [facility];  Service: Ophthalmology;  Laterality: Bilateral;  CATARACT EXTRACTION W/PHACO Left 10/17/2017   Procedure: CATARACT EXTRACTION PHACO AND INTRAOCULAR LENS PLACEMENT LEFT EYE;  Surgeon: Tonny , MD;  Location: [facility];  Service: Ophthalmology;  Laterality: Left;  CDE: 11.64   CATARACT EXTRACTION W/PHACO Right 10/31/2017   Procedure: CATARACT EXTRACTION PHACO AND INTRAOCULAR LENS PLACEMENT RIGHT EYE;  Surgeon: Tonny , MD;  Location: [facility];  Service: Ophthalmology;  Laterality: Right;  CDE: 11.19   fatty tumor removal to left arm Right    PACEMAKER GENERATOR CHANGE  05/31/08   at [facility]: MDT ADDR01/Implanted: 05/31/08 as gen change by Dr Reinaldo Berber   PACEMAKER INSERTION  1992   PPM GENERATOR CHANGEOUT N/A 01/06/2021   Procedure: PPM GENERATOR CHANGEOUT;  Surgeon: Thompson Grayer, MD;  Location: [place] [facility] LAB;  Service: Cardiovascular;   Laterality: N/A;     Allergies  Allergen Reactions   Atorvastatin Swelling and Cough   Hydrocodone Itching      Family History  Problem Relation Age of Onset   Heart failure Mother      Social History Ms. Biggers reports that she quit smoking about 48 years ago. Her smoking use included cigarettes. She started smoking about 77 years ago. She has a 30.00 pack-year smoking history. She has never used smokeless tobacco. Ms. Macklin reports no history of alcohol use.   Review of Systems CONSTITUTIONAL: No weight loss, fever, chills, weakness or fatigue.  HEENT: Eyes: No visual loss, blurred vision, double vision or yellow sclerae.No hearing loss, sneezing, congestion, runny nose or sore throat.  SKIN: No rash or itching.  CARDIOVASCULAR: per hpi RESPIRATORY: No shortness of breath, cough or sputum.  GASTROINTESTINAL: No anorexia, nausea, vomiting or diarrhea. No abdominal pain or blood.  GENITOURINARY: No burning on urination, no polyuria NEUROLOGICAL: No headache, dizziness, syncope, paralysis, ataxia, numbness or tingling in the extremities. No change in bowel or bladder control.  MUSCULOSKELETAL: No muscle, back pain, joint pain or stiffness.  LYMPHATICS: No enlarged nodes. No history of splenectomy.  PSYCHIATRIC: No history of depression or anxiety.  ENDOCRINOLOGIC: No reports of sweating, cold or heat intolerance. No polyuria or polydipsia.  Marland Kitchen   Physical Examination Today's Vitals   07/19/22 1110  BP: 122/66  Pulse: 77  SpO2: 96%  Weight: 130 lb 6.4 oz (59.1 kg)  Height: '5\' 4"'$  (1.626 m)   Body mass index is 22.38 kg/m.  Gen: resting comfortably, no acute distress HEENT: no scleral icterus, pupils equal round and reactive, no palptable cervical adenopathy,  CV: RRR, no m/rg, no jvd Resp: Clear to auscultation bilaterally GI: abdomen is soft, non-tender, non-distended, normal bowel sounds, no hepatosplenomegaly MSK: extremities are warm, trace bilateral  nonpitting edema.  Skin: warm, no rash Neuro:  no focal deficits Psych: appropriate affect   Diagnostic Studies  09/2014 echo Study Conclusions  - Left ventricle: The cavity size was normal. There was severe   asymmetric hypertrophy of the septum. Systolic function was   normal. The estimated ejection fraction was in the range of 60%   to 65%. Wall motion was normal; there were no regional wall   motion abnormalities. - Aortic valve: There was mild regurgitation. - Mitral valve: There was mild regurgitation directed centrally. - Left atrium: The atrium was moderately to severely dilated. - Tricuspid valve: There was moderate regurgitation. - Pulmonary arteries: Systolic pressure was mildly increased. PA   peak pressure: 41 mm Hg (S).  Impressions:  - Appearance is consistent with hypertrophic cardiomyopathy with no   outflow tract obstruction (at rest).  Assessment and Plan   1.Chronic diastolic HF/ LE edema/Lymphedema - overall controlled, continue prn lasix.  - will see if can reach out to lymphedema clinic about getting her home pump repaired or replaced.      2. Afib/acquired thrombophilia - no symptoms, continue current meds including eliquis fo rstroke prvention.  Age 44, wet 59 kg. She is on eliquis 2.'5mg'$  bid.      3.Pacemaker/Tachy brady syndrome - normal recent device check, continue to monitor  F/u 6 months  [name], M.D.

## 2022-07-26 DIAGNOSIS — E1122 Type 2 diabetes mellitus with diabetic chronic kidney disease: Secondary | ICD-10-CM | POA: Diagnosis not present

## 2022-07-26 DIAGNOSIS — E1142 Type 2 diabetes mellitus with diabetic polyneuropathy: Secondary | ICD-10-CM | POA: Diagnosis not present

## 2022-07-26 DIAGNOSIS — Z6821 Body mass index (BMI) 21.0-21.9, adult: Secondary | ICD-10-CM | POA: Diagnosis not present

## 2022-07-26 DIAGNOSIS — Z299 Encounter for prophylactic measures, unspecified: Secondary | ICD-10-CM | POA: Diagnosis not present

## 2022-07-26 DIAGNOSIS — I10 Essential (primary) hypertension: Secondary | ICD-10-CM | POA: Diagnosis not present

## 2022-07-26 DIAGNOSIS — N183 Chronic kidney disease, stage 3 unspecified: Secondary | ICD-10-CM | POA: Diagnosis not present

## 2022-07-26 DIAGNOSIS — E1165 Type 2 diabetes mellitus with hyperglycemia: Secondary | ICD-10-CM | POA: Diagnosis not present

## 2022-08-02 NOTE — Progress Notes (Signed)
Remote pacemaker transmission.   

## 2022-09-07 DIAGNOSIS — E860 Dehydration: Secondary | ICD-10-CM | POA: Diagnosis not present

## 2022-09-07 DIAGNOSIS — I10 Essential (primary) hypertension: Secondary | ICD-10-CM | POA: Diagnosis not present

## 2022-09-07 DIAGNOSIS — R82998 Other abnormal findings in urine: Secondary | ICD-10-CM | POA: Diagnosis not present

## 2022-09-07 DIAGNOSIS — Z299 Encounter for prophylactic measures, unspecified: Secondary | ICD-10-CM | POA: Diagnosis not present

## 2022-09-07 DIAGNOSIS — I5032 Chronic diastolic (congestive) heart failure: Secondary | ICD-10-CM | POA: Diagnosis not present

## 2022-09-08 DIAGNOSIS — R109 Unspecified abdominal pain: Secondary | ICD-10-CM | POA: Diagnosis not present

## 2022-09-08 DIAGNOSIS — M545 Low back pain, unspecified: Secondary | ICD-10-CM | POA: Diagnosis not present

## 2022-09-08 DIAGNOSIS — M5136 Other intervertebral disc degeneration, lumbar region: Secondary | ICD-10-CM | POA: Diagnosis not present

## 2022-09-08 DIAGNOSIS — R143 Flatulence: Secondary | ICD-10-CM | POA: Diagnosis not present

## 2022-09-08 DIAGNOSIS — I700 Atherosclerosis of aorta: Secondary | ICD-10-CM | POA: Diagnosis not present

## 2022-09-08 DIAGNOSIS — M419 Scoliosis, unspecified: Secondary | ICD-10-CM | POA: Diagnosis not present

## 2022-09-13 DIAGNOSIS — I10 Essential (primary) hypertension: Secondary | ICD-10-CM | POA: Diagnosis not present

## 2022-09-13 DIAGNOSIS — I5032 Chronic diastolic (congestive) heart failure: Secondary | ICD-10-CM | POA: Diagnosis not present

## 2022-09-13 DIAGNOSIS — I700 Atherosclerosis of aorta: Secondary | ICD-10-CM | POA: Diagnosis not present

## 2022-09-13 DIAGNOSIS — Z299 Encounter for prophylactic measures, unspecified: Secondary | ICD-10-CM | POA: Diagnosis not present

## 2022-09-20 DIAGNOSIS — H35033 Hypertensive retinopathy, bilateral: Secondary | ICD-10-CM | POA: Diagnosis not present

## 2022-10-03 ENCOUNTER — Other Ambulatory Visit: Payer: Self-pay | Admitting: Cardiology

## 2022-10-05 NOTE — Telephone Encounter (Signed)
Prescription refill request for Eliquis received. Indication: AF Last office visit: 07/19/22  Dominga Ferry MD Scr: 1.16 on 03/01/22  Epic Age: 89 Weight: 59.1  Based on above findings Eliquis 2.5mg  twice daily is the appropriate dose.  Refill approved.

## 2022-10-08 ENCOUNTER — Ambulatory Visit (INDEPENDENT_AMBULATORY_CARE_PROVIDER_SITE_OTHER): Payer: 59

## 2022-10-08 DIAGNOSIS — I495 Sick sinus syndrome: Secondary | ICD-10-CM

## 2022-10-08 LAB — CUP PACEART REMOTE DEVICE CHECK
Battery Remaining Longevity: 158 mo
Battery Voltage: 3.05 V
Brady Statistic AP VP Percent: 0 %
Brady Statistic AP VS Percent: 0 %
Brady Statistic AS VP Percent: 52.46 %
Brady Statistic AS VS Percent: 47.54 %
Brady Statistic RA Percent Paced: 0 %
Brady Statistic RV Percent Paced: 52.46 %
Date Time Interrogation Session: 20240531012224
Implantable Lead Connection Status: 753985
Implantable Lead Connection Status: 753985
Implantable Lead Implant Date: 19980116
Implantable Lead Implant Date: 19980116
Implantable Lead Location: 753859
Implantable Lead Location: 753860
Implantable Lead Model: 5034
Implantable Lead Model: 5534
Implantable Pulse Generator Implant Date: 20220830
Lead Channel Impedance Value: 1235 Ohm
Lead Channel Impedance Value: 1311 Ohm
Lead Channel Impedance Value: 836 Ohm
Lead Channel Impedance Value: 988 Ohm
Lead Channel Pacing Threshold Amplitude: 0.625 V
Lead Channel Pacing Threshold Pulse Width: 0.4 ms
Lead Channel Sensing Intrinsic Amplitude: 17.375 mV
Lead Channel Sensing Intrinsic Amplitude: 17.375 mV
Lead Channel Sensing Intrinsic Amplitude: 2.25 mV
Lead Channel Setting Pacing Amplitude: 2 V
Lead Channel Setting Pacing Pulse Width: 0.4 ms
Lead Channel Setting Sensing Sensitivity: 5.6 mV
Zone Setting Status: 755011

## 2022-10-26 NOTE — Progress Notes (Signed)
Remote pacemaker transmission.   

## 2022-11-26 DIAGNOSIS — K219 Gastro-esophageal reflux disease without esophagitis: Secondary | ICD-10-CM | POA: Diagnosis not present

## 2022-11-26 DIAGNOSIS — I5032 Chronic diastolic (congestive) heart failure: Secondary | ICD-10-CM | POA: Diagnosis not present

## 2022-11-26 DIAGNOSIS — Z299 Encounter for prophylactic measures, unspecified: Secondary | ICD-10-CM | POA: Diagnosis not present

## 2022-11-26 DIAGNOSIS — I10 Essential (primary) hypertension: Secondary | ICD-10-CM | POA: Diagnosis not present

## 2022-11-30 DIAGNOSIS — I10 Essential (primary) hypertension: Secondary | ICD-10-CM | POA: Diagnosis not present

## 2022-11-30 DIAGNOSIS — M549 Dorsalgia, unspecified: Secondary | ICD-10-CM | POA: Diagnosis not present

## 2022-11-30 DIAGNOSIS — Z299 Encounter for prophylactic measures, unspecified: Secondary | ICD-10-CM | POA: Diagnosis not present

## 2022-11-30 DIAGNOSIS — I5032 Chronic diastolic (congestive) heart failure: Secondary | ICD-10-CM | POA: Diagnosis not present

## 2022-12-07 DIAGNOSIS — I10 Essential (primary) hypertension: Secondary | ICD-10-CM | POA: Diagnosis not present

## 2022-12-07 DIAGNOSIS — Z299 Encounter for prophylactic measures, unspecified: Secondary | ICD-10-CM | POA: Diagnosis not present

## 2022-12-07 DIAGNOSIS — I5032 Chronic diastolic (congestive) heart failure: Secondary | ICD-10-CM | POA: Diagnosis not present

## 2022-12-07 DIAGNOSIS — R600 Localized edema: Secondary | ICD-10-CM | POA: Diagnosis not present

## 2022-12-07 DIAGNOSIS — E1122 Type 2 diabetes mellitus with diabetic chronic kidney disease: Secondary | ICD-10-CM | POA: Diagnosis not present

## 2022-12-07 DIAGNOSIS — N183 Chronic kidney disease, stage 3 unspecified: Secondary | ICD-10-CM | POA: Diagnosis not present

## 2022-12-14 DIAGNOSIS — I5032 Chronic diastolic (congestive) heart failure: Secondary | ICD-10-CM | POA: Diagnosis not present

## 2022-12-14 DIAGNOSIS — Z299 Encounter for prophylactic measures, unspecified: Secondary | ICD-10-CM | POA: Diagnosis not present

## 2022-12-14 DIAGNOSIS — E1122 Type 2 diabetes mellitus with diabetic chronic kidney disease: Secondary | ICD-10-CM | POA: Diagnosis not present

## 2022-12-14 DIAGNOSIS — I10 Essential (primary) hypertension: Secondary | ICD-10-CM | POA: Diagnosis not present

## 2022-12-14 DIAGNOSIS — N183 Chronic kidney disease, stage 3 unspecified: Secondary | ICD-10-CM | POA: Diagnosis not present

## 2022-12-14 DIAGNOSIS — E1142 Type 2 diabetes mellitus with diabetic polyneuropathy: Secondary | ICD-10-CM | POA: Diagnosis not present

## 2022-12-20 ENCOUNTER — Encounter: Payer: Self-pay | Admitting: *Deleted

## 2022-12-20 ENCOUNTER — Telehealth: Payer: Self-pay | Admitting: *Deleted

## 2022-12-20 NOTE — Patient Outreach (Signed)
Care Coordination   Initial Visit Note   12/20/2022 Name: BEDIE BOHMER MRN: [id number] DOB: Jan 12, 1933  KADASIA CHISENHALL is a 89 y.o. year old female who sees Kirstie Peri, MD for primary care. I spoke with  [name] by phone today.  Ms. Menth was given information about Care Coordination services today including:   The Care Coordination services include support from the care team which includes your Nurse Coordinator, Clinical Social Worker, or Pharmacist.  The Care Coordination team is here to help remove barriers to the health concerns and goals most important to you. Care Coordination services are voluntary, and the patient may decline or stop services at any time by request to their care team member.   Care Coordination Consent Status: Patient agreed to services and verbal consent obtained.   What matters to the patients health and wellness today?  Managing lower extremity edema.    Goals Addressed             This Visit's Progress    Manage Lower Extremity Edema       Care Coordination Goals: Patient will keep all medical appointments Cardiologist 12/21/22 Patient will take medications as prescribed Lasix 20mg  daily PRN edema Patient is taking twice daily every day Patient will talk with cardiologist about lasix directions at appointment on 12/21/22 Patient will [facility] with [facility] regarding broken lymphedema pump Has had the pump for approximately 3 years. Reported that it was broken at cardiology visit on 07/19/22. Patient will keep feet/legs propped up when sitting Patient will reach out to RN Care Coordinator [phone] with any resource or care coordination needs        SDOH assessments and interventions completed:  Yes  SDOH Interventions Today    Flowsheet Row Most Recent Value  SDOH Interventions   Housing Interventions Intervention Not Indicated  Transportation Interventions Intervention Not Indicated  Financial Strain Interventions  Intervention Not Indicated  Physical Activity Interventions Intervention Not Indicated        Care Coordination Interventions:  Yes, provided  Interventions Today    Flowsheet Row Most Recent Value  Chronic Disease   Chronic disease during today's visit Congestive Heart Failure (CHF)  General Interventions   General Interventions Discussed/Reviewed General Interventions Discussed, General Interventions Reviewed, Doctor Visits, Durable Medical Equipment (DME), Communication with  Doctor Visits Discussed/Reviewed Doctor Visits Discussed, Doctor Visits Reviewed, Specialist  Durable Medical Equipment (DME) Other, Walker, Bed side commode  [Has had a lymphedema pump for legs for approximately 3 years. Reported that it was broken at cardiology visit on 07/19/22. Unsure of DME company but reports insurance covered it and it was delivered.]  PCP/Specialist Visits Compliance with follow-up visit  Sharlene Dory, NP (cardiology) on 12/21/22]  Communication with PCP/Specialists  [Staff message to Sharlene Dory, NP (cardiology) Re: Lasix dose and directions]  Exercise Interventions   Exercise Discussed/Reviewed Physical Activity  Physical Activity Discussed/Reviewed Physical Activity Discussed, Physical Activity Reviewed  [uses a walker and is able to perform ADLs. Has assistance if needed.]  Education Interventions   Education Provided Provided Education  Provided Verbal Education On Other, When to see the doctor, Medication, Insurance Plans  [provided information on Care Management Dept and Care Coordination services. Discussed lymphedema pump and potential need for new order. Discussed UCard benefits. Prop legs/feet up when sitting.]  Pharmacy Interventions   Pharmacy Dicussed/Reviewed Pharmacy Topics Discussed, Pharmacy Topics Reviewed, Medications and their functions  [Lasix prescribed 20mg  daily PRN edema. Pt. is taking BID. Updated med list with  notation. Taking at 8:00am and 12:00pm. Staff  message sent to Sharlene Dory, NP (cardiology) to inform her. Per Pt., meds are prepackaged & delivered to home.]  Safety Interventions   Safety Discussed/Reviewed Safety Discussed, Safety Reviewed, Fall Risk, Home Safety  [dicsussed risk for falls due to lasix use and nocturia. Has a bedside commode. Getting up 2-3 times per night to urinate.]  Home Safety Assistive Devices  [Uses a walker for ambulation. Has an aide with her 6 days a week.]       Follow up plan: Follow up call scheduled for 12/29/22    Encounter Outcome:  Pt. Visit Completed   Demetrios Loll, BSN, RN-BC RN Care Coordinator [facility]  [facility] Direct Dial: [phone] Main #: [phone]

## 2022-12-21 ENCOUNTER — Ambulatory Visit: Payer: 59 | Attending: Cardiology | Admitting: Nurse Practitioner

## 2022-12-21 ENCOUNTER — Encounter: Payer: Self-pay | Admitting: Nurse Practitioner

## 2022-12-21 VITALS — BP 115/60 | HR 67 | Ht 64.0 in | Wt 128.6 lb

## 2022-12-21 DIAGNOSIS — E785 Hyperlipidemia, unspecified: Secondary | ICD-10-CM

## 2022-12-21 DIAGNOSIS — I4891 Unspecified atrial fibrillation: Secondary | ICD-10-CM

## 2022-12-21 DIAGNOSIS — Z950 Presence of cardiac pacemaker: Secondary | ICD-10-CM

## 2022-12-21 DIAGNOSIS — N183 Chronic kidney disease, stage 3 unspecified: Secondary | ICD-10-CM

## 2022-12-21 DIAGNOSIS — I5032 Chronic diastolic (congestive) heart failure: Secondary | ICD-10-CM | POA: Diagnosis not present

## 2022-12-21 DIAGNOSIS — I890 Lymphedema, not elsewhere classified: Secondary | ICD-10-CM | POA: Diagnosis not present

## 2022-12-21 MED ORDER — TORSEMIDE 20 MG PO TABS: 20.0000 mg | ORAL_TABLET | Freq: Two times a day (BID) | ORAL | 6 refills | Status: DC

## 2022-12-21 NOTE — Progress Notes (Unsigned)
Cardiology Office Note:  .   Date:  12/21/2022 ID:  [name], DOB [birth year], MRN [id number] PCP: Kirstie Peri, MD  [facility] Providers Cardiologist:  Dina Rich, MD Electrophysiologist:  Maurice Small, MD    History of Present Illness: Marland Kitchen   MALIKA GREIFF is a 89 y.o. female with a PMH of chronic diastolic CHF, leg edema/lymphedema (previously completed lymphedema clinic in 2019), A-fib, hypertrophic cardiomyopathy, HLD, CKD, and symptomatic bradycardia, s/p PPM who presents today for scheduled follow-up.   2016 echo revealed severe asymmetric septal hypertrophy with no gradient noted.  Due to advanced age and being asymptomatic, no further workup or management has been pursued.  Last seen by Dr. Dina Rich on July 19, 2022.  Her lymphedema and leg edema overall controlled, Dr. Wyline Mood stated would reach out to Lymphedema clinic to get her home pump repaired or replaced.  Overall she was doing well from a cardiac perspective.  Today she presents for follow-up with her son. Leg edema seems to be increasing despite increasing diuretic dosage per son's report.  Patient is taking Lasix 40 mg twice daily per son's report.  Patient overall is doing well from a cardiac perspective, does admit to dyspnea on exertion, has been stable over the last 3 months. Denies any chest pain, palpitations, syncope, presyncope, dizziness, orthopnea, PND, significant weight changes, acute bleeding, or claudication.  Studies Reviewed: Marland Kitchen     EKG:  EKG Interpretation Date/Time:  Tuesday December 21 2022 15:20:53 EDT Ventricular Rate:  75 PR Interval:    QRS Duration:  140 QT Interval:  434 QTC Calculation: 484 R Axis:   -82  Text Interpretation: Ventricular-paced rhythm When compared with ECG of 24-Mar-2020 12:38, PREVIOUS ECG IS PRESENT Confirmed by Sharlene Dory [phone]) on 12/21/2022 3:26:16 PM   Echocardiogram 07/2017 [facility]): LVEF 55 to 60%, mildly dilated LA, mild MR,  trace TR, evidence of mild pulmonary hypertension.     Physical Exam:   VS:  BP 115/60   Pulse 67   Ht 5\' 4"  (1.626 m)   Wt 128 lb 9.6 oz (58.3 kg)   SpO2 96%   BMI 22.07 kg/m    Wt Readings from Last 3 Encounters:  12/21/22 128 lb 9.6 oz (58.3 kg)  07/19/22 130 lb 6.4 oz (59.1 kg)  05/28/22 132 lb 3.2 oz (60 kg)    GEN: Thin, frail 89 year old female in no acute distress, sitting in wheelchair NECK: No JVD; No carotid bruits CARDIAC: S1/S2, RRR, no murmurs, rubs, gallops RESPIRATORY:  Clear to auscultation without rales, wheezing or rhonchi  ABDOMEN: Soft, non-tender, non-distended EXTREMITIES: Gross lymphedema, nonpitting along BLE; No deformity   ASSESSMENT AND PLAN: .    Chronic diastolic CHF, leg edema/lymphedema Stage C, NYHA class I-II symptoms.  Echo in 2016 revealed severe asymmetric septal hypertrophy and evidence of hypertrophic cardiomyopathy.  Due to her advanced age and asymptomatic in the past, have not pursued further workup or management.  Echocardiogram at [facility] in 2019 revealed EF 55 to 60%.  Weights are stable per her report, does note increasing leg edema/lymphedema.  Demetrios Loll, RN is working on getting lymphedema pump replaced.  Will stop Lasix and begin torsemide 40 mg daily tomorrow, she will take 20 mg in the morning and 20 mg in the afternoon.  Son verbalized understanding.  She defers future labs to her nephrologist.  No other medication changes at this time. Low sodium diet, fluid restriction <2L, and daily weights encouraged. Educated to  contact our office for weight gain of 2 lbs overnight or 5 lbs in one week.   2. A-fib, symptomatic second degree heart block, S/P PPM Denies any tachycardia or palpitations.  Heart rate is well-controlled.   Continue Eliquis for stroke prophylaxis, she is on appropriate dosage.  Continue Toprol-XL.  Continue follow-up with EP as scheduled.  3. CKD stage 3 Most recent kidney function revealed serum creatinine  at 1.28 with eGFR at 40.  Avoid nephrotoxic agents.  Switching Lasix per torsemide as mentioned above.  She will follow-up with nephrology for soon follow-up and will have labs performed.  Requested that kidney doctor fax lab results to our office after these are obtained.  Continue to follow-up with PCP and nephrology.  4.  HLD LDL obtained 1 year ago 59. Continue simvastatin. Heart healthy diet encouraged.     Dispo: Follow-up with me or APP in 6 weeks or sooner if anything changes.  Signed, Sharlene Dory, NP

## 2022-12-21 NOTE — Patient Instructions (Addendum)
Medication Instructions:   Stop Lasix (Furosemide) Begin Torsemide 20mg  twice a day - begin tomorrow  Continue all other medications.     Labwork:  none  Testing/Procedures:  none  Follow-Up:  6 weeks   Any Other Special Instructions Will Be Listed Below (If Applicable).   If you need a refill on your cardiac medications before your next appointment, please call your pharmacy.

## 2022-12-24 DIAGNOSIS — I10 Essential (primary) hypertension: Secondary | ICD-10-CM | POA: Diagnosis not present

## 2022-12-24 DIAGNOSIS — Z0000 Encounter for general adult medical examination without abnormal findings: Secondary | ICD-10-CM | POA: Diagnosis not present

## 2022-12-24 DIAGNOSIS — Z299 Encounter for prophylactic measures, unspecified: Secondary | ICD-10-CM | POA: Diagnosis not present

## 2022-12-24 DIAGNOSIS — Z79899 Other long term (current) drug therapy: Secondary | ICD-10-CM | POA: Diagnosis not present

## 2022-12-24 DIAGNOSIS — R5383 Other fatigue: Secondary | ICD-10-CM | POA: Diagnosis not present

## 2022-12-24 DIAGNOSIS — E7800 Pure hypercholesterolemia, unspecified: Secondary | ICD-10-CM | POA: Diagnosis not present

## 2022-12-27 DIAGNOSIS — N1831 Chronic kidney disease, stage 3a: Secondary | ICD-10-CM | POA: Diagnosis not present

## 2022-12-27 DIAGNOSIS — I129 Hypertensive chronic kidney disease with stage 1 through stage 4 chronic kidney disease, or unspecified chronic kidney disease: Secondary | ICD-10-CM | POA: Diagnosis not present

## 2022-12-27 DIAGNOSIS — E876 Hypokalemia: Secondary | ICD-10-CM | POA: Diagnosis not present

## 2022-12-29 ENCOUNTER — Encounter: Payer: Self-pay | Admitting: *Deleted

## 2022-12-29 ENCOUNTER — Telehealth: Payer: Self-pay | Admitting: *Deleted

## 2022-12-29 NOTE — Progress Notes (Signed)
  Care Coordination Note  12/29/2022 Name: Megan Walsh MRN: [id number] DOB: April 26, 1933  Megan Walsh is a 89 y.o. year old female who is a primary care patient of Kirstie Peri, MD and is actively engaged with the care management team. I reached out to Megan Walsh by phone today to assist with re-scheduling a follow up visit with the RN Case Manager  Follow up plan: Unsuccessful telephone outreach attempt made.   [facility]  Care Coordination Care Guide  Direct Dial: [phone]

## 2023-01-05 NOTE — Progress Notes (Signed)
  Care Coordination Note  01/05/2023 Name: Megan Walsh MRN: [id number] DOB: 1932-09-07  [name] is a 89 y.o. year old female who is a primary care patient of Kirstie Peri, MD and is actively engaged with the care management team. I reached out to [name] by phone today to assist with re-scheduling a follow up visit with the RN Case Manager  Follow up plan: Telephone appointment with care management team member scheduled for:01/20/23  [facility] Coordination Care Guide  Direct Dial: [phone]

## 2023-01-07 ENCOUNTER — Ambulatory Visit (INDEPENDENT_AMBULATORY_CARE_PROVIDER_SITE_OTHER): Payer: 59

## 2023-01-07 DIAGNOSIS — I495 Sick sinus syndrome: Secondary | ICD-10-CM

## 2023-01-08 LAB — CUP PACEART REMOTE DEVICE CHECK
Battery Remaining Longevity: 155 mo
Battery Voltage: 3.04 V
Brady Statistic AP VP Percent: 0 %
Brady Statistic AP VS Percent: 0 %
Brady Statistic AS VP Percent: 71.74 %
Brady Statistic AS VS Percent: 28.26 %
Brady Statistic RA Percent Paced: 0 %
Brady Statistic RV Percent Paced: 71.74 %
Date Time Interrogation Session: 20240830010952
Implantable Lead Connection Status: 753985
Implantable Lead Connection Status: 753985
Implantable Lead Implant Date: 19980116
Implantable Lead Implant Date: 19980116
Implantable Lead Location: 753859
Implantable Lead Location: 753860
Implantable Lead Model: 5034
Implantable Lead Model: 5534
Implantable Pulse Generator Implant Date: 20220830
Lead Channel Impedance Value: 1235 Ohm
Lead Channel Impedance Value: 1311 Ohm
Lead Channel Impedance Value: 836 Ohm
Lead Channel Impedance Value: 969 Ohm
Lead Channel Pacing Threshold Amplitude: 0.5 V
Lead Channel Pacing Threshold Pulse Width: 0.4 ms
Lead Channel Sensing Intrinsic Amplitude: 15.25 mV
Lead Channel Sensing Intrinsic Amplitude: 15.25 mV
Lead Channel Sensing Intrinsic Amplitude: 2.25 mV
Lead Channel Setting Pacing Amplitude: 2 V
Lead Channel Setting Pacing Pulse Width: 0.4 ms
Lead Channel Setting Sensing Sensitivity: 5.6 mV
Zone Setting Status: 755011

## 2023-01-11 NOTE — Progress Notes (Signed)
Remote pacemaker transmission.   

## 2023-01-12 DIAGNOSIS — I10 Essential (primary) hypertension: Secondary | ICD-10-CM | POA: Diagnosis not present

## 2023-01-12 DIAGNOSIS — R600 Localized edema: Secondary | ICD-10-CM | POA: Diagnosis not present

## 2023-01-12 DIAGNOSIS — W19XXXA Unspecified fall, initial encounter: Secondary | ICD-10-CM | POA: Diagnosis not present

## 2023-01-12 DIAGNOSIS — Z299 Encounter for prophylactic measures, unspecified: Secondary | ICD-10-CM | POA: Diagnosis not present

## 2023-01-12 DIAGNOSIS — Y92009 Unspecified place in unspecified non-institutional (private) residence as the place of occurrence of the external cause: Secondary | ICD-10-CM | POA: Diagnosis not present

## 2023-01-20 ENCOUNTER — Encounter: Payer: Self-pay | Admitting: *Deleted

## 2023-01-20 ENCOUNTER — Ambulatory Visit: Payer: Self-pay | Admitting: *Deleted

## 2023-01-21 NOTE — Patient Outreach (Signed)
Care Coordination   Follow Up Visit Note   01/20/2023 Name: Megan Walsh MRN: [id number] DOB: April 16, 1933  Megan Walsh is a 89 y.o. year old female who sees Kirstie Peri, MD for primary care. I spoke with  Megan Walsh by phone today.  What matters to the patients health and wellness today?  Managing lower extremity edema    Goals Addressed             This Visit's Progress    Manage Lower Extremity Edema   On track    Care Coordination Goals: Patient will keep all medical appointments Cardiologist 02/10/23 Patient will take medications as prescribed Torsemide 20mg  twice daily Patient will continue to use compression stockings and lymphedema pump for lower extremity edema Patient will find scales and check and record weight each morning after urinating and will call cardiologist with a weight gain of more than 2 lbs overnight or more than 5 lbs in a week Patient will check and record blood pressure daily and will reach out to provider with any readings outside of recommended range Patient will keep feet/legs propped up when sitting Patient will reach out to RN Care Coordinator [phone] with any resource or care coordination needs        SDOH assessments and interventions completed:  Yes SDOH Interventions Today    Flowsheet Row Most Recent Value  SDOH Interventions   Transportation Interventions Intervention Not Indicated  Financial Strain Interventions Intervention Not Indicated       Care Coordination Interventions:  Yes, provided  Interventions Today    Flowsheet Row Most Recent Value  Chronic Disease   Chronic disease during today's visit Congestive Heart Failure (CHF)  General Interventions   General Interventions Discussed/Reviewed General Interventions Discussed, General Interventions Reviewed, Durable Medical Equipment (DME), Doctor Visits  Doctor Visits Discussed/Reviewed Doctor Visits Discussed, Doctor Visits Reviewed, Specialist  [patient  reports that recent visit with nephrologist went well and that her blood work was "good". Lab results were not available in [facility].]  Durable Medical Equipment (DME) BP Cuff, Other, Walker  [Lymphedema pump for lower legs, compression stockings, scales. No home blood pressure readings or weights to report. Has scales but needs to find them.]  PCP/Specialist Visits Compliance with follow-up visit  [cardiologist on 02/10/23. F/U with nephrologist as recommended.]  Exercise Interventions   Exercise Discussed/Reviewed Physical Activity  Physical Activity Discussed/Reviewed Physical Activity Discussed, Physical Activity Reviewed  [uses rolling walker for ambulation. She walks around her home as needed for ADLs but does not exercise.]  Education Interventions   Education Provided Provided Education  Provided Verbal Education On Other, When to see the doctor, Nutrition, Medication, Exercise, Labs  [Encouraged daily weights after urinating and to call cardiologist with any weight gain of more than 2 lbs overnight or more than 5 lbs in one week. Encouraged to check and record blood pressure daily. Rationale provided.]  Labs Reviewed Kidney Function  [12/24/22 Creatinine 1.64, eGFR 30 [facility]) Results from nephrology visit are not available.]  Nutrition Interventions   Nutrition Discussed/Reviewed Nutrition Discussed, Nutrition Reviewed, Fluid intake, Decreasing salt  [Limit salt to less than 2 grams per day and liquids to less than 64 ounces. Patient sips on water all day.]  Pharmacy Interventions   Pharmacy Dicussed/Reviewed Pharmacy Topics Discussed, Pharmacy Topics Reviewed, Medications and their functions  [taking medications regularly. No questions or concerns. Furosemide changed to torsemide 20mg  tiwce a day. Patient takes at 8:00 am and 12:00 pm.]  Safety Interventions  Safety Discussed/Reviewed Safety Discussed, Safety Reviewed, Fall Risk, Home Safety  Home Safety Assistive Devices        Follow up plan: Follow up call scheduled for 02/16/23    Encounter Outcome:  Patient Visit Completed   Demetrios Loll, RN, BSN Care Management Coordinator [facility] Direct Dial: [phone] Main #: [phone]

## 2023-01-25 ENCOUNTER — Ambulatory Visit: Payer: 59 | Admitting: Cardiology

## 2023-02-07 DIAGNOSIS — Z299 Encounter for prophylactic measures, unspecified: Secondary | ICD-10-CM | POA: Diagnosis not present

## 2023-02-07 DIAGNOSIS — N183 Chronic kidney disease, stage 3 unspecified: Secondary | ICD-10-CM | POA: Diagnosis not present

## 2023-02-07 DIAGNOSIS — M25569 Pain in unspecified knee: Secondary | ICD-10-CM | POA: Diagnosis not present

## 2023-02-07 DIAGNOSIS — I5032 Chronic diastolic (congestive) heart failure: Secondary | ICD-10-CM | POA: Diagnosis not present

## 2023-02-07 DIAGNOSIS — I10 Essential (primary) hypertension: Secondary | ICD-10-CM | POA: Diagnosis not present

## 2023-02-07 DIAGNOSIS — Z23 Encounter for immunization: Secondary | ICD-10-CM | POA: Diagnosis not present

## 2023-02-10 ENCOUNTER — Encounter: Payer: Self-pay | Admitting: Nurse Practitioner

## 2023-02-10 ENCOUNTER — Ambulatory Visit: Payer: 59 | Attending: Nurse Practitioner | Admitting: Nurse Practitioner

## 2023-02-10 VITALS — BP 122/64 | HR 72 | Ht 64.0 in | Wt 122.0 lb

## 2023-02-10 DIAGNOSIS — N183 Chronic kidney disease, stage 3 unspecified: Secondary | ICD-10-CM

## 2023-02-10 DIAGNOSIS — Z950 Presence of cardiac pacemaker: Secondary | ICD-10-CM

## 2023-02-10 DIAGNOSIS — I5032 Chronic diastolic (congestive) heart failure: Secondary | ICD-10-CM | POA: Diagnosis not present

## 2023-02-10 DIAGNOSIS — I4891 Unspecified atrial fibrillation: Secondary | ICD-10-CM

## 2023-02-10 DIAGNOSIS — N179 Acute kidney failure, unspecified: Secondary | ICD-10-CM | POA: Diagnosis not present

## 2023-02-10 DIAGNOSIS — I890 Lymphedema, not elsewhere classified: Secondary | ICD-10-CM | POA: Diagnosis not present

## 2023-02-10 DIAGNOSIS — E785 Hyperlipidemia, unspecified: Secondary | ICD-10-CM

## 2023-02-10 MED ORDER — TORSEMIDE 20 MG PO TABS: 20.0000 mg | ORAL_TABLET | Freq: Two times a day (BID) | ORAL | Status: DC

## 2023-02-10 NOTE — Patient Instructions (Addendum)
Medication Instructions:  Your physician has recommended you make the following change in your medication:  Please decrease torsemide to 20 Mg twice daily   Labwork: In 1-2 weeks at [facility]  Testing/Procedures: None  Follow-Up: Your physician recommends that you schedule a follow-up appointment in: 3 Months   Any Other Special Instructions Will Be Listed Below (If Applicable).  If you need a refill on your cardiac medications before your next appointment, please call your pharmacy.

## 2023-02-10 NOTE — Progress Notes (Addendum)
Cardiology Office Note:  .   Date:  02/10/2023 ID:  Megan Walsh, DOB [birth year], MRN [id number] PCP: Kirstie Peri, MD  [facility] Providers Cardiologist:  Dina Rich, MD Electrophysiologist:  Maurice Small, MD    History of Present Illness: Marland Kitchen   Megan Walsh is a 89 y.o. female with a PMH of chronic diastolic CHF, leg edema/lymphedema (previously completed lymphedema clinic in 2019), A-fib, hypertrophic cardiomyopathy, HLD, CKD, and symptomatic bradycardia, s/p PPM who presents today for scheduled follow-up.   2016 echo revealed severe asymmetric septal hypertrophy with no gradient noted.  Due to advanced age and being asymptomatic, no further workup or management has been pursued.  Last seen by Dr. Dina Rich on July 19, 2022.  Her lymphedema and leg edema overall controlled, Dr. Wyline Mood stated would reach out to Lymphedema clinic to get her home pump repaired or replaced.  Overall she was doing well from a cardiac perspective.  Today she presents for follow-up with her son. Leg edema has greatly improved since last office visit. PCP instructed patient to take an extra 20 mg of Torsemide in AM dosing schedule after last office visit, taking a total of 60 mg of Torsemide daily.  She is doing well from a cardiac perspective. Denies any chest pain, shortness of breath, palpitations, syncope, presyncope, dizziness, orthopnea, PND, significant weight changes, acute bleeding, or claudication.  Studies Reviewed: .    Echocardiogram 07/2017 [facility]): LVEF 55 to 60%, mildly dilated LA, mild MR, trace TR, evidence of mild pulmonary hypertension.     Physical Exam:   VS:  BP 122/64   Pulse 72   Ht 5\' 4"  (1.626 m)   Wt 122 lb (55.3 kg)   SpO2 100%   BMI 20.94 kg/m    Wt Readings from Last 3 Encounters:  02/10/23 122 lb (55.3 kg)  12/21/22 128 lb 9.6 oz (58.3 kg)  07/19/22 130 lb 6.4 oz (59.1 kg)    GEN: Thin, frail 89 year old female in no acute distress,  sitting in wheelchair NECK: No JVD; No carotid bruits CARDIAC: S1/S2, RRR, no murmurs, rubs, gallops RESPIRATORY:  Clear to auscultation without rales, wheezing or rhonchi  ABDOMEN: Soft, non-tender, non-distended EXTREMITIES: Gross lymphedema, nonpitting along BLE, improved from last visit; No deformity   ASSESSMENT AND PLAN: .    Chronic diastolic CHF, leg edema/lymphedema Stage C, NYHA class I-II symptoms.  Echo in 2016 revealed severe asymmetric septal hypertrophy and evidence of hypertrophic cardiomyopathy.  Due to her advanced age and asymptomatic in the past, have not pursued further workup or management.  Echocardiogram at [facility] in 2019 revealed EF 55 to 60%.  Weights are stable per her report, leg edema/lymphedema has greatly improved. Continue lymphedema pump/ compression stockings PRN for leg edema. Due to AKI, will reduce Torsemide to 20 mg BID.  No other medication changes at this time. Low sodium diet, fluid restriction <2L, and daily weights encouraged. Educated to contact our office for weight gain of 2 lbs overnight or 5 lbs in one week. Will repeat BMET in 1-2 weeks.   2. A-fib, symptomatic second degree heart block, S/P PPM Denies any tachycardia or palpitations.  Heart rate is well-controlled.   Continue Eliquis for stroke prophylaxis, she is on appropriate dosage.  Continue Toprol-XL.  Continue follow-up with EP as scheduled. Normal device function seen via last remote device check.  3. CKD stage 3, AKI Most recent kidney function revealed serum creatinine at 1.64 with eGFR at 30.  Avoid nephrotoxic agents.  Will reduce Torsemide to 20 mg BID. Encouraged adequate hydration. Will repeat BMET in 1-2 weeks. Continue to follow-up with PCP and nephrology.  4.  HLD LDL 12/2022 54. Continue simvastatin. Heart healthy diet encouraged.     Dispo: Follow-up with me or APP in 3 months or sooner if anything changes.  Signed, Sharlene Dory, NP

## 2023-02-16 ENCOUNTER — Encounter: Payer: Self-pay | Admitting: *Deleted

## 2023-02-16 ENCOUNTER — Ambulatory Visit: Payer: Self-pay | Admitting: *Deleted

## 2023-02-17 NOTE — Patient Outreach (Signed)
Care Coordination   Follow Up Visit Note   02/16/2023 Name: Megan Walsh MRN: [id number] DOB: 02-28-1933  Megan Walsh is a 89 y.o. year old female who sees Megan Peri, MD for primary care. I spoke with  Megan Walsh by phone today.  What matters to the patients health and wellness today?  Managing heart failure and edema    Goals Addressed             This Visit's Progress    Manage Lower Extremity Edema   On track    Care Coordination Goals: Patient will keep all medical appointments 05/16/22 Cardiology Patient will take medications as prescribed Torsemide 20mg  twice daily Patient will continue to use compression stockings and lymphedema pump for lower extremity edema Patient will find/purchase scales and check and record weight each morning after urinating and will call cardiologist with a weight gain of more than 2 lbs overnight or more than 5 lbs in a week Patient will check and record blood pressure daily and will reach out to provider with any readings outside of recommended range Patient will discuss daily weight and blood pressure at next telephone visit with RN Care Management Coordinator Patient will keep feet/legs propped up when sitting Patient will reach out to RN Care Coordinator [phone] with any resource or care coordination needs        SDOH assessments and interventions completed:  Yes  SDOH Interventions Today    Flowsheet Row Most Recent Value  SDOH Interventions   Food Insecurity Interventions Intervention Not Indicated  Transportation Interventions Intervention Not Indicated        Care Coordination Interventions:  Yes, provided  Interventions Today    Flowsheet Row Most Recent Value  Chronic Disease   Chronic disease during today's visit Chronic Kidney Disease/End Stage Renal Disease (ESRD), Congestive Heart Failure (CHF)  General Interventions   General Interventions Discussed/Reviewed General Interventions Discussed, General  Interventions Reviewed, Durable Medical Equipment (DME), Doctor Visits, Labs  Labs Kidney Function  [Repeat BMP per cardiologist orders around 02/24/23]  Doctor Visits Discussed/Reviewed Doctor Visits Discussed, Doctor Visits Reviewed, PCP, Specialist  Megan Walsh and discused recent cardiology and nephrology visits]  Durable Medical Equipment (DME) BP Cuff, Bed side commode, Walker, Other  [Purchase new scales for daily weights. No daily weights or home blood pressure readings available. Wears compression stockings. Lymphedema pump]  PCP/Specialist Visits Compliance with follow-up visit  [05/16/22 Cardiology. Follow-up with PCP and nephrologist as recommended]  Exercise Interventions   Exercise Discussed/Reviewed Exercise Discussed, Exercise Reviewed, Assistive device use and maintanence, Physical Activity  [requires assistance with ADLs. Has an aide that comes daily. Uses rolling walker for ambulation.]  Physical Activity Discussed/Reviewed Physical Activity Discussed, Physical Activity Reviewed  [increase walking if tolerated. Continue to use walker for ambulation.]  Education Interventions   Education Provided Provided Education  Provided Verbal Education On Nutrition, When to see the doctor, Labs, Exercise, Medication, Other  [Monitor and record blood pressure daily. Take log to provider visits for review. Monitor and record weight daily.]  Labs Reviewed Kidney Function  [12/24/22 Creatinine 1.64, eGFR 30]  Nutrition Interventions   Nutrition Discussed/Reviewed Nutrition Discussed, Nutrition Reviewed, Decreasing salt, Decreasing fats, Adding fruits and vegetables, Fluid intake, Portion sizes  [Heart Healthy diet]  Pharmacy Interventions   Pharmacy Dicussed/Reviewed Pharmacy Topics Discussed, Pharmacy Topics Reviewed, Medications and their functions  [Take torsemide 20mg  BID as directed by cardiologist. Has medications prepackaged and delivered to her home.]  Safety Interventions   Safety  Discussed/Reviewed Safety Discussed, Safety Reviewed, Fall Risk, Home Safety  Home Safety Assistive Devices  Advanced Directive Interventions   Advanced Directives Discussed/Reviewed Advanced Directives Discussed, Advanced Directives Reviewed       Follow up plan: Follow up call scheduled for 03/16/23    Encounter Outcome:  Patient Visit Completed   Demetrios Loll, RN, BSN Care Management Coordinator [facility]  [facility] Direct Dial: [phone] Main #: [phone]

## 2023-02-17 NOTE — Patient Outreach (Signed)
Care Coordination   Follow Up Visit Note   02/16/2023 Name: Megan Walsh MRN: [id number] DOB: [birth year]  Megan Walsh is a 89 y.o. year old female who sees Kirstie Peri, MD for primary care. I spoke with  Megan Walsh by phone today.  What matters to the patients health and wellness today?  Managing heart failure and edema    Goals Addressed             This Visit's Progress    Managing CHF and Lower Extremity Edema   On track    Care Coordination Goals: Patient will keep all medical appointments 05/17/23 Cardiology Patient will take medications as prescribed Torsemide 20mg  twice daily Patient will repeat BMP around 02/24/23 per cardiologists instructions Patient will continue to use compression stockings and lymphedema pump for lower extremity edema Patient will find/purchase scales and check and record weight each morning after urinating and will call cardiologist with a weight gain of more than 2 lbs overnight or more than 5 lbs in a week Patient will check and record blood pressure daily and will reach out to provider with any readings outside of recommended range Patient will discuss daily weight and blood pressure at next telephone visit with RN Care Management Coordinator Patient will keep feet/legs propped up when sitting Patient will reach out to RN Care Coordinator [phone] with any resource or care coordination needs        SDOH assessments and interventions completed:  Yes  SDOH Interventions Today    Flowsheet Row Most Recent Value  SDOH Interventions   Food Insecurity Interventions Intervention Not Indicated  Transportation Interventions Intervention Not Indicated        Care Coordination Interventions:  Yes, provided  Interventions Today    Flowsheet Row Most Recent Value  Chronic Disease   Chronic disease during today's visit Chronic Kidney Disease/End Stage Renal Disease (ESRD), Congestive Heart Failure (CHF)  General Interventions    General Interventions Discussed/Reviewed General Interventions Discussed, General Interventions Reviewed, Durable Medical Equipment (DME), Doctor Visits, Labs  Labs Kidney Function  [Repeat BMP per cardiologist orders around 02/24/23]  Doctor Visits Discussed/Reviewed Doctor Visits Discussed, Doctor Visits Reviewed, PCP, Specialist  Annabell Sabal and discused recent cardiology and nephrology visits]  Durable Medical Equipment (DME) BP Cuff, Bed side commode, Walker, Other  [Purchase new scales for daily weights. No daily weights or home blood pressure readings available. Wears compression stockings. Lymphedema pump]  PCP/Specialist Visits Compliance with follow-up visit  [05/16/22 Cardiology. Follow-up with PCP and nephrologist as recommended]  Exercise Interventions   Exercise Discussed/Reviewed Exercise Discussed, Exercise Reviewed, Assistive device use and maintanence, Physical Activity  [requires assistance with ADLs. Has an aide that comes daily. Uses rolling walker for ambulation.]  Physical Activity Discussed/Reviewed Physical Activity Discussed, Physical Activity Reviewed  [increase walking if tolerated. Continue to use walker for ambulation.]  Education Interventions   Education Provided Provided Education  Provided Verbal Education On Nutrition, When to see the doctor, Labs, Exercise, Medication, Other  [Monitor and record blood pressure daily. Take log to provider visits for review. Monitor and record weight daily.]  Labs Reviewed Kidney Function  [12/24/22 Creatinine 1.64, eGFR 30]  Nutrition Interventions   Nutrition Discussed/Reviewed Nutrition Discussed, Nutrition Reviewed, Decreasing salt, Decreasing fats, Adding fruits and vegetables, Fluid intake, Portion sizes  [Heart Healthy diet]  Pharmacy Interventions   Pharmacy Dicussed/Reviewed Pharmacy Topics Discussed, Pharmacy Topics Reviewed, Medications and their functions  [Take torsemide 20mg  BID as directed by cardiologist. Has medications  prepackaged  and delivered to her home.]  Safety Interventions   Safety Discussed/Reviewed Safety Discussed, Safety Reviewed, Fall Risk, Home Safety  Home Safety Assistive Devices  Advanced Directive Interventions   Advanced Directives Discussed/Reviewed Advanced Directives Discussed, Advanced Directives Reviewed       Follow up plan: Follow up call scheduled for 03/16/23    Encounter Outcome:  Patient Visit Completed   Demetrios Loll, RN, BSN Care Management Coordinator [facility]  [facility] Direct Dial: [phone] Main #: [phone]

## 2023-02-28 ENCOUNTER — Other Ambulatory Visit: Payer: Self-pay | Admitting: Internal Medicine

## 2023-03-07 ENCOUNTER — Other Ambulatory Visit: Payer: Self-pay | Admitting: Cardiology

## 2023-03-16 ENCOUNTER — Ambulatory Visit: Payer: Self-pay | Admitting: *Deleted

## 2023-03-16 ENCOUNTER — Encounter: Payer: Self-pay | Admitting: *Deleted

## 2023-03-24 DIAGNOSIS — H40023 Open angle with borderline findings, high risk, bilateral: Secondary | ICD-10-CM | POA: Diagnosis not present

## 2023-03-28 ENCOUNTER — Other Ambulatory Visit: Payer: Self-pay | Admitting: Cardiology

## 2023-03-28 NOTE — Telephone Encounter (Signed)
Prescription refill request for Eliquis received. Indication: AF Last office visit: 02/10/23  Shawnie Dapper NP Scr: 1.16 on 03/01/22  Epic Age: 89 Weight: 55.3kg  Based on above findings Eliquis 2.5mg  twice daily is the appropriate dose.  Refill approved.

## 2023-04-08 ENCOUNTER — Ambulatory Visit (INDEPENDENT_AMBULATORY_CARE_PROVIDER_SITE_OTHER): Payer: 59

## 2023-04-08 DIAGNOSIS — I495 Sick sinus syndrome: Secondary | ICD-10-CM

## 2023-04-09 LAB — CUP PACEART REMOTE DEVICE CHECK
Battery Remaining Longevity: 152 mo
Battery Voltage: 3.04 V
Brady Statistic AP VP Percent: 0 %
Brady Statistic AP VS Percent: 0 %
Brady Statistic AS VP Percent: 73.46 %
Brady Statistic AS VS Percent: 26.54 %
Brady Statistic RA Percent Paced: 0 %
Brady Statistic RV Percent Paced: 73.46 %
Date Time Interrogation Session: 20241129001655
Implantable Lead Connection Status: 753985
Implantable Lead Connection Status: 753985
Implantable Lead Implant Date: 19980116
Implantable Lead Implant Date: 19980116
Implantable Lead Location: 753859
Implantable Lead Location: 753860
Implantable Lead Model: 5034
Implantable Lead Model: 5534
Implantable Pulse Generator Implant Date: 20220830
Lead Channel Impedance Value: 1064 Ohm
Lead Channel Impedance Value: 1235 Ohm
Lead Channel Impedance Value: 1311 Ohm
Lead Channel Impedance Value: 912 Ohm
Lead Channel Pacing Threshold Amplitude: 0.375 V
Lead Channel Pacing Threshold Pulse Width: 0.4 ms
Lead Channel Sensing Intrinsic Amplitude: 17.75 mV
Lead Channel Sensing Intrinsic Amplitude: 17.75 mV
Lead Channel Sensing Intrinsic Amplitude: 2.25 mV
Lead Channel Setting Pacing Amplitude: 2 V
Lead Channel Setting Pacing Pulse Width: 0.4 ms
Lead Channel Setting Sensing Sensitivity: 5.6 mV
Zone Setting Status: 755011

## 2023-04-14 ENCOUNTER — Encounter: Payer: Self-pay | Admitting: *Deleted

## 2023-04-14 ENCOUNTER — Ambulatory Visit: Payer: Self-pay | Admitting: *Deleted

## 2023-04-18 NOTE — Patient Outreach (Signed)
  Care Coordination   Follow Up Visit Note   04/14/2023 Name: Megan Walsh MRN: [id number] DOB: 08-Apr-1933  Megan Walsh is a 89 y.o. year old female who sees Kirstie Peri, MD for primary care. I spoke with  Megan Walsh by phone today.  What matters to the patients health and wellness today?  Lower extremity swelling    Goals Addressed             This Visit's Progress    Managing CHF and Lower Extremity Edema       Care Coordination Goals: Patient will keep all medical appointments 05/17/23 Cardiology Patient will take medications as prescribed Torsemide 20mg  twice daily Patient will repeat BMP per cardiologist's instructions Patient will continue to use compression stockings and lymphedema pump for lower extremity edema Patient will find/purchase scales and check and record weight each morning after urinating and will call cardiologist with a weight gain of more than 2 lbs overnight or more than 5 lbs in a week Patient will check and record blood pressure daily and will reach out to provider with any readings outside of recommended range Patient will discuss daily weight and blood pressure at next telephone visit with RN Care Management Coordinator Patient will keep feet/legs propped up when sitting Patient will talk with cardiology office regarding increase in lower extremity edema over the past few days. No redness, weeping, or broken skin. Patient will reach out to RN Care Coordinator [phone] with any resource or care coordination needs        SDOH assessments and interventions completed:  Yes  SDOH Interventions Today    Flowsheet Row Most Recent Value  SDOH Interventions   Transportation Interventions Patient Resources (Friends/Family)  Financial Strain Interventions Intervention Not Indicated        Care Coordination Interventions:  Yes, provided  Interventions Today    Flowsheet Row Most Recent Value  Chronic Disease   Chronic disease during  today's visit Congestive Heart Failure (CHF)  General Interventions   General Interventions Discussed/Reviewed General Interventions Reviewed, General Interventions Discussed, Labs, Durable Medical Equipment (DME), Communication with  Labs --  [due for BMP ordered by cardiologist]  Durable Medical Equipment (DME) Other  [adjustable bed and lymphedema compression device. Needs scales and blood pressure monitor.]  Communication with PCP/Specialists  Sharlene Dory, NP (cardiology) via staff message Re: increase in lower extremity edema over the past few days. Denies redness, weeping, or broken skin. Propping feet up when seated. Urinating as usual.]  Exercise Interventions   Exercise Discussed/Reviewed Exercise Discussed, Exercise Reviewed, Physical Activity  Physical Activity Discussed/Reviewed Physical Activity Discussed, Physical Activity Reviewed  [has assistance with ADLs. Encouraged to increase activity as tolerated.]  Education Interventions   Education Provided Provided Education  Provided Verbal Education On Nutrition, Labs, When to see the doctor, Exercise, Medication  Nutrition Interventions   Nutrition Discussed/Reviewed Nutrition Discussed, Nutrition Reviewed, Decreasing salt, Fluid intake  Pharmacy Interventions   Pharmacy Dicussed/Reviewed Pharmacy Topics Discussed, Pharmacy Topics Reviewed, Medications and their functions  [taking Torsemide 20mg  BID as directed]  Safety Interventions   Safety Discussed/Reviewed Safety Discussed, Safety Reviewed, Fall Risk, Home Safety  Home Safety Assistive Devices       Follow up plan: Follow up call scheduled for 04/20/23    Encounter Outcome:  Patient Visit Completed   Demetrios Loll, RN, BSN Care Management Coordinator [facility]  [facility] Direct Dial: [phone] Main #: [phone]

## 2023-04-20 ENCOUNTER — Ambulatory Visit: Payer: Self-pay | Admitting: *Deleted

## 2023-04-20 ENCOUNTER — Encounter: Payer: Self-pay | Admitting: *Deleted

## 2023-04-20 DIAGNOSIS — I5032 Chronic diastolic (congestive) heart failure: Secondary | ICD-10-CM | POA: Diagnosis not present

## 2023-04-20 DIAGNOSIS — M545 Low back pain, unspecified: Secondary | ICD-10-CM | POA: Diagnosis not present

## 2023-04-20 DIAGNOSIS — I10 Essential (primary) hypertension: Secondary | ICD-10-CM | POA: Diagnosis not present

## 2023-04-20 DIAGNOSIS — R609 Edema, unspecified: Secondary | ICD-10-CM | POA: Diagnosis not present

## 2023-04-20 DIAGNOSIS — Z299 Encounter for prophylactic measures, unspecified: Secondary | ICD-10-CM | POA: Diagnosis not present

## 2023-04-20 NOTE — Patient Outreach (Signed)
  Care Coordination   Follow Up Visit Note   04/20/2023 Name: Megan Walsh MRN: [id number] DOB: December 21, 1932  Megan Walsh is a 89 y.o. year old female who sees Kirstie Peri, MD for primary care. I spoke with  Megan Walsh and her Aide by phone today.  What matters to the patients health and wellness today?  Managing lower extremity edema    Goals Addressed             This Visit's Progress    Managing CHF and Lower Extremity Edema   Not on track    Care Coordination Goals: Patient will keep all medical appointments 05/17/23 Cardiology Patient will take medications as prescribed Torsemide 20mg  twice daily PCP added Farxiga 10mg  today Patient will repeat BMP per cardiologist's instructions Patient will continue to use compression stockings and lymphedema pump for lower extremity edema Patient will find/purchase scales and check and record weight each morning after urinating and will call cardiologist with a weight gain of more than 2 lbs overnight or more than 5 lbs in a week Patient will check and record blood pressure daily and will reach out to provider with any readings outside of recommended range Patient will discuss daily weight and blood pressure at next telephone visit with RN Care Management Coordinator Patient will keep feet/legs propped up when sitting Patient will talk with cardiology office regarding increase in lower extremity edema. No redness, weeping, or broken skin. Patient will reach out to RN Care Coordinator [phone] with any resource or care coordination needs        SDOH assessments and interventions completed:  Yes  SDOH Interventions Today    Flowsheet Row Most Recent Value  SDOH Interventions   Transportation Interventions Patient Resources (Friends/Family)        Care Coordination Interventions:  Yes, provided  Interventions Today    Flowsheet Row Most Recent Value  Chronic Disease   Chronic disease during today's visit  Congestive Heart Failure (CHF)  General Interventions   General Interventions Discussed/Reviewed General Interventions Discussed, General Interventions Reviewed, Durable Medical Equipment (DME), Communication with, Doctor Visits  Doctor Visits Discussed/Reviewed Doctor Visits Discussed, Doctor Visits Reviewed, PCP, Specialist  [discussed visit with PCP earlier today. He was made aware of increased lower extremity swelling and advised to prop feet up when seated.]  Durable Medical Equipment (DME) Other  [adjustable bed and lymphedema compression device. Needs scales and blood pressure monitor.]  PCP/Specialist Visits Compliance with follow-up visit  [05/17/23 with cardiologist in [place]]  Communication with PCP/Specialists  [staff message to [facility] Triage Re: increase in lower extremity edema over the past week. Skin is tight but denies redness, weeping, or broken skin. Propping feet up when seated. Urinating as usual.]  Education Interventions   Education Provided Provided Education  Provided Verbal Education On When to see the doctor, Medication, Nutrition  Nutrition Interventions   Nutrition Discussed/Reviewed Nutrition Discussed, Nutrition Reviewed, Decreasing salt, Fluid intake  Pharmacy Interventions   Pharmacy Dicussed/Reviewed Pharmacy Topics Discussed, Pharmacy Topics Reviewed, Medications and their functions  [taking Torsemide 20mg  BID as directed. PCP added [place] 10mg  today. She has not started this yet.]       Follow up plan: Follow up call scheduled for 04/25/23    Encounter Outcome:  Patient Visit Completed   Demetrios Loll, RN, BSN Care Manager [place]  [facility]  Population Health  Direct Dial: [phone] Main #: [phone]

## 2023-04-21 ENCOUNTER — Telehealth: Payer: Self-pay | Admitting: *Deleted

## 2023-04-21 DIAGNOSIS — I5032 Chronic diastolic (congestive) heart failure: Secondary | ICD-10-CM

## 2023-04-21 DIAGNOSIS — Z79899 Other long term (current) drug therapy: Secondary | ICD-10-CM

## 2023-04-21 MED ORDER — TORSEMIDE 20 MG PO TABS: ORAL_TABLET | ORAL | Status: DC

## 2023-04-21 NOTE — Telephone Encounter (Signed)
Patient notified and verbalized understanding. 

## 2023-04-21 NOTE — Telephone Encounter (Signed)
-----   Message from Sharlene Dory sent at 04/20/2023  3:15 PM EST ----- Regarding: RE: edema Please contact patient and aide. Recommend taking an extra Torsemide tablet in the AM. Therefore, she will take a total of 40 mg of Torsemide in AM, 20 mg of Torsemide in PM. Please obtain labs including BMET, proBNP and Mag in 1 week. I would also instruct them on the following when it comes to her HF instructions: Low sodium diet, fluid restriction <2L, and daily weights encouraged. Educated to contact our office for weight gain of 2 lbs overnight or 5 lbs in one week.   Thank you!   Best,  Sharlene Dory, NP ----- Message ----- From: Lesle Chris, LPN Sent: 40/98/1191   2:28 PM EST To: Sharlene Dory, NP Subject: FW: edema                                       ----- Message ----- From: Gwenith Daily, RN Sent: 04/20/2023   1:34 PM EST To: Sheliah Mends Triage Subject: FW: edema                                      I spoke with Ms Brownell and her aid again today. She had a visit with her PCP this morning and he added [place]. I pulled that into the med list. Her feet/legs are still more swollen than usual. She's wearing compression hose and propping them up when seated. Her aid said that her hose were more difficult to get on today and that her legs are very tight. No weeping or breaks in the skin. She wasn't sure if there was pitting. She is scheduled for a f/u appt on 05/17/23. PCP didn't have any recommendations other than propping feet up. They would appreciate a phone call with recommendations.   Please let me know if I can be of any assistance.  Thank you!  Demetrios Loll, RN, BSN Care Manager [place]  [facility]  Population Health  Direct Dial: [phone] Main #: [phone] ----- Message ----- From: Gwenith Daily, RN Sent: 04/14/2023   4:53 PM EST To: Sharlene Dory, NP Subject: edema                                          Wynelle Cleveland,  I talked  with Ms Cashmore today and just wanted to pass on her concern that her ankles and lower legs are more swollen over the past few days than usual. She is taking torsemide BID and is urinating as usual. She denied and redness, blisters, or breaks in the skin. I asked if she was wearing compression socks/hose but she didn't directly answer that question. She does try to keep her legs/feet up when seated and while sleeping at night. When asked if she is more SOB than usual, she said yes. Her speech and breathing were normal while on the phone. She does not have a cough.   Please let me know if I can be of any assistance.   Thank you!  Demetrios Loll, RN, BSN Care Management Coordinator [facility]  [facility] Direct Dial: [phone] Main #: [phone]

## 2023-04-25 ENCOUNTER — Ambulatory Visit: Payer: Self-pay | Admitting: *Deleted

## 2023-04-25 NOTE — Patient Outreach (Signed)
Care Coordination   Follow Up Visit Note   04/25/2023 Name: Megan Walsh MRN: [id number] DOB: 03/07/1933  Megan Walsh is a 89 y.o. year old female who sees Kirstie Peri, MD for primary care. I spoke with  Megan Walsh and her caregivers by phone today.  What matters to the patients health and wellness today?  Managing swelling in legs    Goals Addressed             This Visit's Progress    Managing CHF and Lower Extremity Edema   On track    Care Coordination Goals: Patient will keep all medical appointments 05/17/23 Cardiology Patient will take medications as prescribed Cardiologist increased Torsemide from 20mg  BID to 40mg  in the morning and 20mg  in the afternoon on 04/21/23. Started new dose on 04/23/23.  Patient will repeat BMP, proBNP,and Magnesium on 04/29/23 at [facility] office Patient will continue to use compression stockings and lymphedema pump for lower extremity edema Patient will find/purchase scales and check and record weight each morning after urinating and will call cardiologist with a weight gain of more than 2 lbs overnight or more than 5 lbs in a week Patient will check and record blood pressure daily and will reach out to provider with any readings outside of recommended range Patient will discuss daily weight and blood pressure at next telephone visit with RN Care Management Coordinator Patient will keep feet/legs propped up when sitting Patient will follow a low sodium/DASH diet and fluid restriction of less than 2 liters per day Patient will reach out to RN Care Coordinator [phone] with any resource or care coordination needs        SDOH assessments and interventions completed:  No     Care Coordination Interventions:  Yes, provided  Interventions Today    Flowsheet Row Most Recent Value  Chronic Disease   Chronic disease during today's visit Congestive Heart Failure (CHF)  General Interventions   General Interventions  Discussed/Reviewed General Interventions Discussed, General Interventions Reviewed, Labs, Communication with, Doctor Visits, Durable Medical Equipment (DME)  [discussed lower extremity edema. Aide reports that swelling has improved and her legs are not as tight and uncomfortable as they were last week.]  Labs --  [will have BMP, proBNP, and magnesium labs drawn on 04/29/23 at [facility] in [place]. Verified that orders are in [facility].]  Doctor Visits Discussed/Reviewed Doctor Visits Discussed, Doctor Visits Reviewed, Specialist  Durable Medical Equipment (DME) --  [adjustable bed, compression stockings, lymphedema compression device. Needs scales and blood pressure monitor.]  PCP/Specialist Visits Compliance with follow-up visit  [PCP at the end of December. 05/17/23 with cardiologist in [place]. Labs on 04/29/23 at [facility] [place]]  Communication with PCP/Specialists  [Staff message to Sharlene Dory, NP last week Re: increase in lower extremity edema. She adjusted medication and her office staff called patient/Aide with recommendations.]  Education Interventions   Education Provided Provided Education  Provided Verbal Education On Nutrition, Other, When to see the doctor, Medication  [daily weights, blood pressure monitoring, monitoring for increased swelling]  Nutrition Interventions   Nutrition Discussed/Reviewed Nutrition Discussed, Decreasing salt, Nutrition Reviewed, Fluid intake  [cardiologist recommended less than 2 liters of fluids per day and to follow a low sodium/DASH diet]  Pharmacy Interventions   Pharmacy Dicussed/Reviewed Pharmacy Topics Discussed, Pharmacy Topics Reviewed, Medications and their functions  [torsemide was increased from 20mg  BID to 40 mg in the morning and 20 mg in the afternoon/evening. Started new dose on 04/23/2023.]  Follow up plan: Follow up call scheduled for 05/27/2023    Encounter Outcome:  Patient Visit Completed   Demetrios Loll, RN, BSN Care  Manager [place]  [facility]  Population Health  Direct Dial: [phone] Main #: [phone]

## 2023-04-29 DIAGNOSIS — I5022 Chronic systolic (congestive) heart failure: Secondary | ICD-10-CM | POA: Diagnosis not present

## 2023-04-29 DIAGNOSIS — E1169 Type 2 diabetes mellitus with other specified complication: Secondary | ICD-10-CM | POA: Diagnosis not present

## 2023-05-02 NOTE — Progress Notes (Signed)
Remote pacemaker transmission.   

## 2023-05-02 NOTE — Addendum Note (Signed)
Addended by: Elease Etienne A on: 05/02/2023 02:10 PM   Modules accepted: Orders

## 2023-05-09 DIAGNOSIS — M06049 Rheumatoid arthritis without rheumatoid factor, unspecified hand: Secondary | ICD-10-CM | POA: Diagnosis not present

## 2023-05-09 DIAGNOSIS — Z299 Encounter for prophylactic measures, unspecified: Secondary | ICD-10-CM | POA: Diagnosis not present

## 2023-05-09 DIAGNOSIS — I5032 Chronic diastolic (congestive) heart failure: Secondary | ICD-10-CM | POA: Diagnosis not present

## 2023-05-09 DIAGNOSIS — I10 Essential (primary) hypertension: Secondary | ICD-10-CM | POA: Diagnosis not present

## 2023-05-09 DIAGNOSIS — R112 Nausea with vomiting, unspecified: Secondary | ICD-10-CM | POA: Diagnosis not present

## 2023-05-13 ENCOUNTER — Encounter: Payer: 59 | Admitting: Cardiovascular Disease

## 2023-05-14 DIAGNOSIS — Z79899 Other long term (current) drug therapy: Secondary | ICD-10-CM | POA: Diagnosis not present

## 2023-05-14 DIAGNOSIS — E1140 Type 2 diabetes mellitus with diabetic neuropathy, unspecified: Secondary | ICD-10-CM | POA: Diagnosis not present

## 2023-05-14 DIAGNOSIS — M109 Gout, unspecified: Secondary | ICD-10-CM | POA: Diagnosis not present

## 2023-05-14 DIAGNOSIS — N183 Chronic kidney disease, stage 3 unspecified: Secondary | ICD-10-CM | POA: Diagnosis not present

## 2023-05-14 DIAGNOSIS — I482 Chronic atrial fibrillation, unspecified: Secondary | ICD-10-CM | POA: Diagnosis not present

## 2023-05-14 DIAGNOSIS — Z885 Allergy status to narcotic agent status: Secondary | ICD-10-CM | POA: Diagnosis not present

## 2023-05-14 DIAGNOSIS — K5900 Constipation, unspecified: Secondary | ICD-10-CM | POA: Diagnosis not present

## 2023-05-14 DIAGNOSIS — R410 Disorientation, unspecified: Secondary | ICD-10-CM | POA: Diagnosis not present

## 2023-05-14 DIAGNOSIS — R55 Syncope and collapse: Secondary | ICD-10-CM | POA: Diagnosis not present

## 2023-05-14 DIAGNOSIS — I130 Hypertensive heart and chronic kidney disease with heart failure and stage 1 through stage 4 chronic kidney disease, or unspecified chronic kidney disease: Secondary | ICD-10-CM | POA: Diagnosis not present

## 2023-05-14 DIAGNOSIS — Z79891 Long term (current) use of opiate analgesic: Secondary | ICD-10-CM | POA: Diagnosis not present

## 2023-05-14 DIAGNOSIS — E785 Hyperlipidemia, unspecified: Secondary | ICD-10-CM | POA: Diagnosis not present

## 2023-05-14 DIAGNOSIS — R54 Age-related physical debility: Secondary | ICD-10-CM | POA: Diagnosis not present

## 2023-05-14 DIAGNOSIS — R109 Unspecified abdominal pain: Secondary | ICD-10-CM | POA: Diagnosis not present

## 2023-05-14 DIAGNOSIS — Z7984 Long term (current) use of oral hypoglycemic drugs: Secondary | ICD-10-CM | POA: Diagnosis not present

## 2023-05-14 DIAGNOSIS — E7800 Pure hypercholesterolemia, unspecified: Secondary | ICD-10-CM | POA: Diagnosis not present

## 2023-05-14 DIAGNOSIS — Z7901 Long term (current) use of anticoagulants: Secondary | ICD-10-CM | POA: Diagnosis not present

## 2023-05-14 DIAGNOSIS — I4891 Unspecified atrial fibrillation: Secondary | ICD-10-CM | POA: Diagnosis not present

## 2023-05-14 DIAGNOSIS — E1122 Type 2 diabetes mellitus with diabetic chronic kidney disease: Secondary | ICD-10-CM | POA: Diagnosis not present

## 2023-05-14 DIAGNOSIS — I2510 Atherosclerotic heart disease of native coronary artery without angina pectoris: Secondary | ICD-10-CM | POA: Diagnosis not present

## 2023-05-14 DIAGNOSIS — E860 Dehydration: Secondary | ICD-10-CM | POA: Diagnosis not present

## 2023-05-14 DIAGNOSIS — N179 Acute kidney failure, unspecified: Secondary | ICD-10-CM | POA: Diagnosis not present

## 2023-05-14 DIAGNOSIS — I5032 Chronic diastolic (congestive) heart failure: Secondary | ICD-10-CM | POA: Diagnosis not present

## 2023-05-15 DIAGNOSIS — E785 Hyperlipidemia, unspecified: Secondary | ICD-10-CM | POA: Diagnosis not present

## 2023-05-15 DIAGNOSIS — Z7901 Long term (current) use of anticoagulants: Secondary | ICD-10-CM | POA: Diagnosis not present

## 2023-05-15 DIAGNOSIS — E1122 Type 2 diabetes mellitus with diabetic chronic kidney disease: Secondary | ICD-10-CM | POA: Diagnosis not present

## 2023-05-15 DIAGNOSIS — R55 Syncope and collapse: Secondary | ICD-10-CM | POA: Diagnosis not present

## 2023-05-15 DIAGNOSIS — I130 Hypertensive heart and chronic kidney disease with heart failure and stage 1 through stage 4 chronic kidney disease, or unspecified chronic kidney disease: Secondary | ICD-10-CM | POA: Diagnosis not present

## 2023-05-15 DIAGNOSIS — E7800 Pure hypercholesterolemia, unspecified: Secondary | ICD-10-CM | POA: Diagnosis not present

## 2023-05-15 DIAGNOSIS — E860 Dehydration: Secondary | ICD-10-CM | POA: Diagnosis not present

## 2023-05-15 DIAGNOSIS — I5032 Chronic diastolic (congestive) heart failure: Secondary | ICD-10-CM | POA: Diagnosis not present

## 2023-05-15 DIAGNOSIS — Z79891 Long term (current) use of opiate analgesic: Secondary | ICD-10-CM | POA: Diagnosis not present

## 2023-05-15 DIAGNOSIS — I2510 Atherosclerotic heart disease of native coronary artery without angina pectoris: Secondary | ICD-10-CM | POA: Diagnosis not present

## 2023-05-15 DIAGNOSIS — N179 Acute kidney failure, unspecified: Secondary | ICD-10-CM | POA: Diagnosis not present

## 2023-05-15 DIAGNOSIS — M109 Gout, unspecified: Secondary | ICD-10-CM | POA: Diagnosis not present

## 2023-05-15 DIAGNOSIS — E1140 Type 2 diabetes mellitus with diabetic neuropathy, unspecified: Secondary | ICD-10-CM | POA: Diagnosis not present

## 2023-05-15 DIAGNOSIS — Z885 Allergy status to narcotic agent status: Secondary | ICD-10-CM | POA: Diagnosis not present

## 2023-05-15 DIAGNOSIS — Z79899 Other long term (current) drug therapy: Secondary | ICD-10-CM | POA: Diagnosis not present

## 2023-05-15 DIAGNOSIS — Z7984 Long term (current) use of oral hypoglycemic drugs: Secondary | ICD-10-CM | POA: Diagnosis not present

## 2023-05-15 DIAGNOSIS — N183 Chronic kidney disease, stage 3 unspecified: Secondary | ICD-10-CM | POA: Diagnosis not present

## 2023-05-15 DIAGNOSIS — R54 Age-related physical debility: Secondary | ICD-10-CM | POA: Diagnosis not present

## 2023-05-15 DIAGNOSIS — R410 Disorientation, unspecified: Secondary | ICD-10-CM | POA: Diagnosis not present

## 2023-05-15 DIAGNOSIS — K5900 Constipation, unspecified: Secondary | ICD-10-CM | POA: Diagnosis not present

## 2023-05-15 DIAGNOSIS — I4891 Unspecified atrial fibrillation: Secondary | ICD-10-CM | POA: Diagnosis not present

## 2023-05-17 ENCOUNTER — Ambulatory Visit (INDEPENDENT_AMBULATORY_CARE_PROVIDER_SITE_OTHER): Payer: 59 | Admitting: Nurse Practitioner

## 2023-05-17 DIAGNOSIS — I5032 Chronic diastolic (congestive) heart failure: Secondary | ICD-10-CM

## 2023-05-17 NOTE — Progress Notes (Signed)
 Appt rescheduled. Pt unable to show up to office today.

## 2023-05-18 ENCOUNTER — Encounter: Payer: Self-pay | Admitting: Nurse Practitioner

## 2023-05-18 ENCOUNTER — Ambulatory Visit: Payer: 59 | Attending: Nurse Practitioner | Admitting: Nurse Practitioner

## 2023-05-18 VITALS — BP 114/70 | HR 77 | Ht 63.0 in | Wt 114.4 lb

## 2023-05-18 DIAGNOSIS — I4891 Unspecified atrial fibrillation: Secondary | ICD-10-CM

## 2023-05-18 DIAGNOSIS — N183 Chronic kidney disease, stage 3 unspecified: Secondary | ICD-10-CM

## 2023-05-18 DIAGNOSIS — R0602 Shortness of breath: Secondary | ICD-10-CM

## 2023-05-18 DIAGNOSIS — E785 Hyperlipidemia, unspecified: Secondary | ICD-10-CM | POA: Diagnosis not present

## 2023-05-18 DIAGNOSIS — I890 Lymphedema, not elsewhere classified: Secondary | ICD-10-CM | POA: Diagnosis not present

## 2023-05-18 DIAGNOSIS — I5032 Chronic diastolic (congestive) heart failure: Secondary | ICD-10-CM

## 2023-05-18 DIAGNOSIS — R531 Weakness: Secondary | ICD-10-CM | POA: Diagnosis not present

## 2023-05-18 DIAGNOSIS — Z950 Presence of cardiac pacemaker: Secondary | ICD-10-CM | POA: Diagnosis not present

## 2023-05-18 DIAGNOSIS — R634 Abnormal weight loss: Secondary | ICD-10-CM | POA: Diagnosis not present

## 2023-05-18 NOTE — Progress Notes (Signed)
 Cardiology Office Note:  .   Date:  05/18/2023 ID:  Megan Walsh, DOB Oct 24, 1932, MRN [id number] PCP: Megan Isles, MD  [facility] Providers Cardiologist:  Megan Carrier, MD Electrophysiologist:  Megan FORBES Furbish, MD    History of Present Illness: Megan   Ladasha A Walsh is a 89 y.o. female with a PMH of chronic diastolic CHF, leg edema/lymphedema (previously completed lymphedema clinic in 2019), A-fib, hypertrophic cardiomyopathy, HLD, CKD, and symptomatic bradycardia, s/p PPM who presents today for scheduled follow-up.   2016 echo revealed severe asymmetric septal hypertrophy with no gradient noted.  Due to advanced age and being asymptomatic, no further workup or management has been pursued.  Last seen by Dr. Carrier Megan on July 19, 2022.  Her lymphedema and leg edema overall controlled, Dr. Alvan stated would reach out to Lymphedema clinic to get her home pump repaired or replaced.  Overall she was doing well from a cardiac perspective.  Today she presents for follow-up with her son. Leg edema has greatly improved since last office visit. PCP instructed patient to take an extra 20 mg of Torsemide  in AM dosing schedule after last office visit, taking a total of 60 mg of Torsemide  daily.  She is doing well from a cardiac perspective. Denies any chest pain, shortness of breath, palpitations, syncope, presyncope, dizziness, orthopnea, PND, significant weight changes, acute bleeding, or claudication.  [facility] ED visit this past week d/t near syncopal episode, felt to be vasovagal. Prior to hospital arrival, she was constipated, received an enema in the hospital, was straining on bedside commode and had a near syncopal episode that was witnessed by RN Megan Walsh at [facility]   Today she presents for follow-up. She states she is feeling better since leaving the hospital and does not have any symptoms that she felt surrounding vasovagal near syncopal episode.  She does admit to  worsening dyspnea on exertion for the past 2 to 3 weeks.  Caregiver in the room also notices this as well.  She has lost 8 lbs unintentionally since I last saw her, endorses being fatigued and weak.  Caregiver says she is not as active as she was several months ago.  Caregiver says she is eating her meals, but has complained of the food. Denies any chest pain, palpitations, syncope, presyncope, dizziness, orthopnea, PND, acute bleeding, or claudication.  Per son and patient's report, leg edema has improved since last office visit.    Studies Reviewed: .    Echocardiogram 07/2017 [facility]): LVEF 55 to 60%, mildly dilated LA, mild MR, trace TR, evidence of mild pulmonary hypertension.     Physical Exam:   VS:  BP 114/70   Pulse 77   Ht 5' 3 (1.6 m)   Wt 114 lb 6.4 oz (51.9 kg)   SpO2 100%   BMI 20.27 kg/m    Wt Readings from Last 3 Encounters:  05/18/23 114 lb 6.4 oz (51.9 kg)  02/10/23 122 lb (55.3 kg)  12/21/22 128 lb 9.6 oz (58.3 kg)    GEN: Thin, frail 89 year old female in no acute distress, sitting in wheelchair, looks weak NECK: No JVD; No carotid bruits CARDIAC: S1/S2, RRR, no murmurs, rubs, gallops RESPIRATORY:  Clear and diminished to auscultation without rales, wheezing or rhonchi  ABDOMEN: Soft, non-tender, non-distended EXTREMITIES: Improved lymphedema, nonpitting along BLE, improved from last visit; No deformity   ASSESSMENT AND PLAN: .    Chronic diastolic CHF, leg edema/lymphedema Stage C, NYHA class II-III symptoms.  Echo in  2016 revealed severe asymmetric septal hypertrophy and evidence of hypertrophic cardiomyopathy.  Due to her advanced age and asymptomatic in the past, have not pursued further workup or management.  Echocardiogram at [facility] in 2019 revealed EF 55 to 60%.  Weight has declined unintentionally since last office visit, leg edema/lymphedema has greatly improved. Continue lymphedema pump/ compression stockings PRN for leg edema. No  medication changes at this time. Low sodium diet, fluid restriction <2L, and daily weights encouraged. Educated to contact our office for weight gain of 2 lbs overnight or 5 lbs in one week.    2. A-fib, symptomatic second degree heart block, S/P PPM Denies any tachycardia or palpitations.  Heart rate is well-controlled.   Continue Eliquis  for stroke prophylaxis, she is on appropriate dosage.  Continue Toprol -XL.  Continue follow-up with EP as scheduled. Normal device function seen via last remote device check.  3. CKD stage 3 Most recent kidney function revealed serum creatinine at 1.41 with eGFR at 36.  Avoid nephrotoxic agents. Encouraged adequate hydration. No medication changes at this time. Continue to follow-up with PCP and nephrology.  4.  HLD LDL 12/2022 54. Continue simvastatin . Heart healthy diet encouraged.   5. Weakness, unintentional weight loss, shortness of breath Pt reports increasing SHOB with exertion, appears to be due to deconditioning as pt has increased weakness and has become more sedentary per caregiver's report. Not in acute distress on exam, euvolemic and well compensated. She also has lost weight unintentionally since I last saw her and admits to poor appetite and is showing signs of failure to thrive. Discussed/reviewed goals of care. Encouraged pt to speak this over with family. She verbalized understanding. Care and ED precautions discussed. Continue to follow with PCP. If symptoms do not improve by next office visit, plan to discuss more regarding advanced care planning/palliative care.     Dispo: Follow-up with me or APP in 4-6 weeks or sooner if anything changes.  Signed, Almarie Crate, NP

## 2023-05-18 NOTE — Patient Instructions (Signed)
Medication Instructions:  Continue all current medications.  Labwork: none  Testing/Procedures: none  Follow-Up: 4-6 weeks   Any Other Special Instructions Will Be Listed Below (If Applicable).  If you need a refill on your cardiac medications before your next appointment, please call your pharmacy.

## 2023-05-20 ENCOUNTER — Ambulatory Visit: Payer: 59 | Attending: Cardiovascular Disease | Admitting: Cardiovascular Disease

## 2023-05-20 NOTE — Progress Notes (Deleted)
    PCP: Maree Isles, MD Primary Cardiologist: Dr Alvan Primary EP:  DrMealor  Megan Walsh is a 89 y.o. female who presents today for routine electrophysiology followup.  Since her generator change, the patient reports doing very well.  Today, she denies symptoms of palpitations, chest pain, shortness of breath,  lower extremity edema, dizziness, presyncope, or syncope.  The patient is otherwise without complaint today.      Physical Exam: There were no vitals filed for this visit.   Gen: Appears comfortable, well-nourished CV: RRR, no dependent edema The device site is normal -- no tenderness, edema, drainage, redness, threatened erosion.  Pulm: breathing easily   Pacemaker interrogation- reviewed in detail today,  See PACEART report  Assessment and Plan:  1. Symptomatic second degree heart block Normal pacemaker function See Pace Art report No changes today she is not device dependant today  2. Permanent afib On coumadin for chads2vasc score of 7 Mostly rate controlled but with occasional RVR  3. Chronic diastolic dysfunction Stable No change required today  Return in a year  Megan FORBES Furbish, MD 05/20/2023 10:35 AM

## 2023-05-23 ENCOUNTER — Encounter: Payer: Self-pay | Admitting: Cardiovascular Disease

## 2023-05-24 DIAGNOSIS — I4891 Unspecified atrial fibrillation: Secondary | ICD-10-CM | POA: Diagnosis not present

## 2023-05-24 DIAGNOSIS — N390 Urinary tract infection, site not specified: Secondary | ICD-10-CM | POA: Diagnosis not present

## 2023-05-24 DIAGNOSIS — I509 Heart failure, unspecified: Secondary | ICD-10-CM | POA: Diagnosis not present

## 2023-05-24 DIAGNOSIS — R197 Diarrhea, unspecified: Secondary | ICD-10-CM | POA: Diagnosis not present

## 2023-05-24 DIAGNOSIS — K5900 Constipation, unspecified: Secondary | ICD-10-CM | POA: Diagnosis not present

## 2023-05-24 DIAGNOSIS — R0600 Dyspnea, unspecified: Secondary | ICD-10-CM | POA: Diagnosis not present

## 2023-05-24 DIAGNOSIS — K5730 Diverticulosis of large intestine without perforation or abscess without bleeding: Secondary | ICD-10-CM | POA: Diagnosis not present

## 2023-05-24 DIAGNOSIS — R531 Weakness: Secondary | ICD-10-CM | POA: Diagnosis not present

## 2023-05-24 DIAGNOSIS — N179 Acute kidney failure, unspecified: Secondary | ICD-10-CM | POA: Diagnosis not present

## 2023-05-24 DIAGNOSIS — R55 Syncope and collapse: Secondary | ICD-10-CM | POA: Diagnosis not present

## 2023-05-24 DIAGNOSIS — Z794 Long term (current) use of insulin: Secondary | ICD-10-CM | POA: Diagnosis not present

## 2023-05-24 DIAGNOSIS — K8689 Other specified diseases of pancreas: Secondary | ICD-10-CM | POA: Diagnosis not present

## 2023-05-24 DIAGNOSIS — N183 Chronic kidney disease, stage 3 unspecified: Secondary | ICD-10-CM | POA: Diagnosis not present

## 2023-05-24 DIAGNOSIS — Z950 Presence of cardiac pacemaker: Secondary | ICD-10-CM | POA: Diagnosis not present

## 2023-05-24 DIAGNOSIS — Z7901 Long term (current) use of anticoagulants: Secondary | ICD-10-CM | POA: Diagnosis not present

## 2023-05-24 DIAGNOSIS — E1122 Type 2 diabetes mellitus with diabetic chronic kidney disease: Secondary | ICD-10-CM | POA: Diagnosis not present

## 2023-05-24 DIAGNOSIS — Z8679 Personal history of other diseases of the circulatory system: Secondary | ICD-10-CM | POA: Diagnosis not present

## 2023-05-24 DIAGNOSIS — E860 Dehydration: Secondary | ICD-10-CM | POA: Diagnosis not present

## 2023-05-24 DIAGNOSIS — Z87891 Personal history of nicotine dependence: Secondary | ICD-10-CM | POA: Diagnosis not present

## 2023-05-24 DIAGNOSIS — R2689 Other abnormalities of gait and mobility: Secondary | ICD-10-CM | POA: Diagnosis not present

## 2023-05-24 DIAGNOSIS — I4811 Longstanding persistent atrial fibrillation: Secondary | ICD-10-CM | POA: Diagnosis not present

## 2023-05-24 DIAGNOSIS — I5032 Chronic diastolic (congestive) heart failure: Secondary | ICD-10-CM | POA: Diagnosis not present

## 2023-05-24 DIAGNOSIS — I700 Atherosclerosis of aorta: Secondary | ICD-10-CM | POA: Diagnosis not present

## 2023-05-24 DIAGNOSIS — Z20822 Contact with and (suspected) exposure to covid-19: Secondary | ICD-10-CM | POA: Diagnosis not present

## 2023-05-24 DIAGNOSIS — I130 Hypertensive heart and chronic kidney disease with heart failure and stage 1 through stage 4 chronic kidney disease, or unspecified chronic kidney disease: Secondary | ICD-10-CM | POA: Diagnosis not present

## 2023-05-25 DIAGNOSIS — E119 Type 2 diabetes mellitus without complications: Secondary | ICD-10-CM | POA: Diagnosis not present

## 2023-05-25 DIAGNOSIS — K219 Gastro-esophageal reflux disease without esophagitis: Secondary | ICD-10-CM | POA: Diagnosis not present

## 2023-05-25 DIAGNOSIS — E860 Dehydration: Secondary | ICD-10-CM | POA: Diagnosis not present

## 2023-05-25 DIAGNOSIS — N390 Urinary tract infection, site not specified: Secondary | ICD-10-CM | POA: Diagnosis not present

## 2023-05-25 DIAGNOSIS — R197 Diarrhea, unspecified: Secondary | ICD-10-CM | POA: Diagnosis not present

## 2023-05-25 DIAGNOSIS — I10 Essential (primary) hypertension: Secondary | ICD-10-CM | POA: Diagnosis not present

## 2023-05-25 DIAGNOSIS — I4891 Unspecified atrial fibrillation: Secondary | ICD-10-CM | POA: Diagnosis not present

## 2023-05-25 DIAGNOSIS — M109 Gout, unspecified: Secondary | ICD-10-CM | POA: Diagnosis not present

## 2023-05-25 DIAGNOSIS — N179 Acute kidney failure, unspecified: Secondary | ICD-10-CM | POA: Diagnosis not present

## 2023-05-26 DIAGNOSIS — M109 Gout, unspecified: Secondary | ICD-10-CM | POA: Diagnosis not present

## 2023-05-26 DIAGNOSIS — R55 Syncope and collapse: Secondary | ICD-10-CM | POA: Diagnosis not present

## 2023-05-26 DIAGNOSIS — E119 Type 2 diabetes mellitus without complications: Secondary | ICD-10-CM | POA: Diagnosis not present

## 2023-05-26 DIAGNOSIS — N390 Urinary tract infection, site not specified: Secondary | ICD-10-CM | POA: Diagnosis not present

## 2023-05-26 DIAGNOSIS — Z8679 Personal history of other diseases of the circulatory system: Secondary | ICD-10-CM | POA: Diagnosis not present

## 2023-05-26 DIAGNOSIS — N179 Acute kidney failure, unspecified: Secondary | ICD-10-CM | POA: Diagnosis not present

## 2023-05-26 DIAGNOSIS — E860 Dehydration: Secondary | ICD-10-CM | POA: Diagnosis not present

## 2023-05-26 DIAGNOSIS — K219 Gastro-esophageal reflux disease without esophagitis: Secondary | ICD-10-CM | POA: Diagnosis not present

## 2023-05-26 DIAGNOSIS — I10 Essential (primary) hypertension: Secondary | ICD-10-CM | POA: Diagnosis not present

## 2023-05-26 DIAGNOSIS — I4891 Unspecified atrial fibrillation: Secondary | ICD-10-CM | POA: Diagnosis not present

## 2023-05-26 DIAGNOSIS — E785 Hyperlipidemia, unspecified: Secondary | ICD-10-CM | POA: Diagnosis not present

## 2023-05-26 DIAGNOSIS — Z79899 Other long term (current) drug therapy: Secondary | ICD-10-CM | POA: Diagnosis not present

## 2023-05-27 ENCOUNTER — Ambulatory Visit: Payer: Self-pay | Admitting: *Deleted

## 2023-05-27 ENCOUNTER — Encounter: Payer: Self-pay | Admitting: *Deleted

## 2023-05-27 NOTE — Patient Outreach (Unsigned)
  Care Coordination   Follow Up Visit Note   05/27/2023 Name: Megan Walsh MRN: [id number] DOB: 1932/10/30  Megan Walsh is a 89 y.o. year old female who sees Kirstie Peri, MD for primary care. I spoke with  Megan Walsh by phone today.  What matters to the patients health and wellness today?  Increasing strength and stamina    Goals Addressed   None     SDOH assessments and interventions completed:  Yes{THN Tip this will not be part of the note when signed-REQUIRED REPORT FIELD DO NOT DELETE (Optional):27901}     Care Coordination Interventions:  Yes, provided {THN Tip this will not be part of the note when signed-REQUIRED REPORT FIELD DO NOT DELETE (Optional):27901}   Follow up plan: Follow up call scheduled for 06/03/23    Encounter Outcome:  Patient Visit Completed {THN Tip this will not be part of the note when signed-REQUIRED REPORT FIELD DO NOT DELETE (Optional):27901}  Demetrios Loll, RN, BSN [place]  [facility], [facility] RN Care Manager Direct Dial: [phone]

## 2023-05-31 DIAGNOSIS — E109 Type 1 diabetes mellitus without complications: Secondary | ICD-10-CM | POA: Diagnosis not present

## 2023-06-01 DIAGNOSIS — I5032 Chronic diastolic (congestive) heart failure: Secondary | ICD-10-CM | POA: Diagnosis not present

## 2023-06-01 DIAGNOSIS — R609 Edema, unspecified: Secondary | ICD-10-CM | POA: Diagnosis not present

## 2023-06-01 DIAGNOSIS — Z299 Encounter for prophylactic measures, unspecified: Secondary | ICD-10-CM | POA: Diagnosis not present

## 2023-06-01 DIAGNOSIS — R197 Diarrhea, unspecified: Secondary | ICD-10-CM | POA: Diagnosis not present

## 2023-06-01 DIAGNOSIS — R52 Pain, unspecified: Secondary | ICD-10-CM | POA: Diagnosis not present

## 2023-06-01 DIAGNOSIS — I10 Essential (primary) hypertension: Secondary | ICD-10-CM | POA: Diagnosis not present

## 2023-06-03 ENCOUNTER — Ambulatory Visit: Payer: Self-pay | Admitting: *Deleted

## 2023-06-03 DIAGNOSIS — E1122 Type 2 diabetes mellitus with diabetic chronic kidney disease: Secondary | ICD-10-CM | POA: Diagnosis not present

## 2023-06-03 DIAGNOSIS — I2510 Atherosclerotic heart disease of native coronary artery without angina pectoris: Secondary | ICD-10-CM | POA: Diagnosis not present

## 2023-06-03 DIAGNOSIS — R531 Weakness: Secondary | ICD-10-CM | POA: Diagnosis not present

## 2023-06-03 DIAGNOSIS — I4811 Longstanding persistent atrial fibrillation: Secondary | ICD-10-CM | POA: Diagnosis not present

## 2023-06-03 DIAGNOSIS — I5023 Acute on chronic systolic (congestive) heart failure: Secondary | ICD-10-CM | POA: Diagnosis not present

## 2023-06-03 DIAGNOSIS — Z7901 Long term (current) use of anticoagulants: Secondary | ICD-10-CM | POA: Diagnosis not present

## 2023-06-03 DIAGNOSIS — M109 Gout, unspecified: Secondary | ICD-10-CM | POA: Diagnosis not present

## 2023-06-03 DIAGNOSIS — M1991 Primary osteoarthritis, unspecified site: Secondary | ICD-10-CM | POA: Diagnosis not present

## 2023-06-03 NOTE — Patient Outreach (Signed)
  Care Coordination   06/03/2023 Name: Megan Walsh MRN: [id number] DOB: 25-Jan-1933   Care Coordination Outreach Attempts:  An unsuccessful outreach was attempted for an appointment today.  Follow Up Plan:  Additional outreach attempts will be made to offer the patient complex care management information and services.   Encounter Outcome:  No Answer. Left HIPAA compliant VM.   Care Coordination Interventions:  No, not indicated    Demetrios Loll, RN, BSN [place]  [facility], [facility] RN Care Manager Direct Dial: [phone]

## 2023-06-04 DIAGNOSIS — R55 Syncope and collapse: Secondary | ICD-10-CM | POA: Diagnosis not present

## 2023-06-07 DIAGNOSIS — I2510 Atherosclerotic heart disease of native coronary artery without angina pectoris: Secondary | ICD-10-CM | POA: Diagnosis not present

## 2023-06-07 DIAGNOSIS — I4811 Longstanding persistent atrial fibrillation: Secondary | ICD-10-CM | POA: Diagnosis not present

## 2023-06-07 DIAGNOSIS — E1122 Type 2 diabetes mellitus with diabetic chronic kidney disease: Secondary | ICD-10-CM | POA: Diagnosis not present

## 2023-06-07 DIAGNOSIS — R531 Weakness: Secondary | ICD-10-CM | POA: Diagnosis not present

## 2023-06-07 DIAGNOSIS — M1991 Primary osteoarthritis, unspecified site: Secondary | ICD-10-CM | POA: Diagnosis not present

## 2023-06-07 DIAGNOSIS — I5023 Acute on chronic systolic (congestive) heart failure: Secondary | ICD-10-CM | POA: Diagnosis not present

## 2023-06-07 DIAGNOSIS — Z7901 Long term (current) use of anticoagulants: Secondary | ICD-10-CM | POA: Diagnosis not present

## 2023-06-07 DIAGNOSIS — M109 Gout, unspecified: Secondary | ICD-10-CM | POA: Diagnosis not present

## 2023-06-08 DIAGNOSIS — E1122 Type 2 diabetes mellitus with diabetic chronic kidney disease: Secondary | ICD-10-CM | POA: Diagnosis not present

## 2023-06-08 DIAGNOSIS — R531 Weakness: Secondary | ICD-10-CM | POA: Diagnosis not present

## 2023-06-08 DIAGNOSIS — I2510 Atherosclerotic heart disease of native coronary artery without angina pectoris: Secondary | ICD-10-CM | POA: Diagnosis not present

## 2023-06-08 DIAGNOSIS — I4811 Longstanding persistent atrial fibrillation: Secondary | ICD-10-CM | POA: Diagnosis not present

## 2023-06-08 DIAGNOSIS — Z7901 Long term (current) use of anticoagulants: Secondary | ICD-10-CM | POA: Diagnosis not present

## 2023-06-08 DIAGNOSIS — I5023 Acute on chronic systolic (congestive) heart failure: Secondary | ICD-10-CM | POA: Diagnosis not present

## 2023-06-08 DIAGNOSIS — M109 Gout, unspecified: Secondary | ICD-10-CM | POA: Diagnosis not present

## 2023-06-08 DIAGNOSIS — M1991 Primary osteoarthritis, unspecified site: Secondary | ICD-10-CM | POA: Diagnosis not present

## 2023-06-09 DIAGNOSIS — E1122 Type 2 diabetes mellitus with diabetic chronic kidney disease: Secondary | ICD-10-CM | POA: Diagnosis not present

## 2023-06-09 DIAGNOSIS — M109 Gout, unspecified: Secondary | ICD-10-CM | POA: Diagnosis not present

## 2023-06-09 DIAGNOSIS — I2510 Atherosclerotic heart disease of native coronary artery without angina pectoris: Secondary | ICD-10-CM | POA: Diagnosis not present

## 2023-06-09 DIAGNOSIS — Z7901 Long term (current) use of anticoagulants: Secondary | ICD-10-CM | POA: Diagnosis not present

## 2023-06-09 DIAGNOSIS — I5023 Acute on chronic systolic (congestive) heart failure: Secondary | ICD-10-CM | POA: Diagnosis not present

## 2023-06-09 DIAGNOSIS — R531 Weakness: Secondary | ICD-10-CM | POA: Diagnosis not present

## 2023-06-09 DIAGNOSIS — I4811 Longstanding persistent atrial fibrillation: Secondary | ICD-10-CM | POA: Diagnosis not present

## 2023-06-09 DIAGNOSIS — M1991 Primary osteoarthritis, unspecified site: Secondary | ICD-10-CM | POA: Diagnosis not present

## 2023-06-10 DIAGNOSIS — I519 Heart disease, unspecified: Secondary | ICD-10-CM | POA: Diagnosis not present

## 2023-06-10 DIAGNOSIS — E46 Unspecified protein-calorie malnutrition: Secondary | ICD-10-CM | POA: Diagnosis not present

## 2023-06-10 DIAGNOSIS — I509 Heart failure, unspecified: Secondary | ICD-10-CM | POA: Diagnosis not present

## 2023-06-10 DIAGNOSIS — G4730 Sleep apnea, unspecified: Secondary | ICD-10-CM | POA: Diagnosis not present

## 2023-06-13 DIAGNOSIS — E119 Type 2 diabetes mellitus without complications: Secondary | ICD-10-CM | POA: Diagnosis not present

## 2023-06-13 DIAGNOSIS — Z794 Long term (current) use of insulin: Secondary | ICD-10-CM | POA: Diagnosis not present

## 2023-06-13 DIAGNOSIS — N1831 Chronic kidney disease, stage 3a: Secondary | ICD-10-CM | POA: Diagnosis not present

## 2023-06-13 DIAGNOSIS — E876 Hypokalemia: Secondary | ICD-10-CM | POA: Diagnosis not present

## 2023-06-13 DIAGNOSIS — I129 Hypertensive chronic kidney disease with stage 1 through stage 4 chronic kidney disease, or unspecified chronic kidney disease: Secondary | ICD-10-CM | POA: Diagnosis not present

## 2023-06-16 ENCOUNTER — Encounter: Payer: Self-pay | Admitting: *Deleted

## 2023-06-16 ENCOUNTER — Ambulatory Visit: Payer: Self-pay | Admitting: *Deleted

## 2023-06-22 ENCOUNTER — Ambulatory Visit: Payer: 59 | Admitting: Nurse Practitioner

## 2023-06-27 ENCOUNTER — Telehealth: Payer: Self-pay | Admitting: Nurse Practitioner

## 2023-06-27 DIAGNOSIS — Z7189 Other specified counseling: Secondary | ICD-10-CM | POA: Diagnosis not present

## 2023-06-27 DIAGNOSIS — Z0000 Encounter for general adult medical examination without abnormal findings: Secondary | ICD-10-CM | POA: Diagnosis not present

## 2023-06-27 DIAGNOSIS — G309 Alzheimer's disease, unspecified: Secondary | ICD-10-CM | POA: Diagnosis not present

## 2023-06-27 DIAGNOSIS — M06049 Rheumatoid arthritis without rheumatoid factor, unspecified hand: Secondary | ICD-10-CM | POA: Diagnosis not present

## 2023-06-27 DIAGNOSIS — I700 Atherosclerosis of aorta: Secondary | ICD-10-CM | POA: Diagnosis not present

## 2023-06-27 DIAGNOSIS — Z299 Encounter for prophylactic measures, unspecified: Secondary | ICD-10-CM | POA: Diagnosis not present

## 2023-06-27 NOTE — Telephone Encounter (Signed)
   Pre-operative Risk Assessment    Patient Name: Megan Walsh  DOB: [birth year] MRN: [id number]     Request for Surgical Clearance    Procedure:  Dental Extraction - Amount of Teeth to be Pulled:  MULTIPLE  Date of Surgery:  Clearance TBD                                 Surgeon:  Hilda Blades, DDS Surgeon's Group or Practice Name:  [facility] Phone number:  [phone] Fax number:  [phone]   Type of Clearance Requested:   - Pharmacy:  Hold Apixaban (Eliquis) prior to surgery, if so please advise how long.  Patient has a pacemaker    Type of Anesthesia:  General    Additional requests/questions:    Signed, Megan Salon   06/27/2023, 4:17 PM

## 2023-06-27 NOTE — Telephone Encounter (Signed)
Pre-op team,   Please clarify how many teeth are being extracted.    Thank you!  DW

## 2023-06-28 NOTE — Telephone Encounter (Signed)
I called the ph# [phone] on that has been entered in on the clearance form. This # has a recording stating vm not set up. I believe the # is incorrect.   I then looked up Dr. Hilda Blades, DDS with [facility] There are multiple locations. I called one of the offices and found that Dr. Julian Reil goes to the [place], [place] location 4 days a week. I was given the ph# [phone].  I called the [place] office and left vm to please return call to our preop team to discuss clearance request further. Left vm that we cannot take the clearance request. Preop clearance request needs to come from the provider as to be sure all information is correct.  Once I have confirmed all information I will present to the preop APP for review.

## 2023-06-29 ENCOUNTER — Encounter: Payer: Self-pay | Admitting: Cardiovascular Disease

## 2023-06-29 NOTE — Progress Notes (Signed)
PERIOPERATIVE PRESCRIPTION FOR IMPLANTED CARDIAC DEVICE PROGRAMMING   Patient Information:  Patient: Megan Walsh  MRN: [id number]  Date of Birth: [birth year]    Procedure:  Dental Extraction - Amount of Teeth to be Pulled:  MULTIPLE Date of Surgery:  Clearance TBD                              Surgeon:  Hilda Blades, DDS Surgeon's Group or Practice Name:  [facility] Phone number:  [phone] Fax number:  [phone]  Device Information:   Clinic EP Physician:   Dr. York Pellant Device Type:  Pacemaker Manufacturer and Phone #:  Medtronic: [id number] Pacemaker Dependent?:  No Date of Last Device Check:  04/24/2023        Normal Device Function?:  Yes     Electrophysiologist's Recommendations:   Have magnet available. Provide continuous ECG monitoring when magnet is used or reprogramming is to be performed.  Procedure may interfere with device function.  Magnet should be placed over device during procedure.  Per Device Clinic Standing Orders, Lenor Coffin  06/29/2023 6:55 AM

## 2023-07-05 NOTE — Telephone Encounter (Signed)
 I was able to s/w the dental office and confirmed procedure: 5 TEETH SURGICALLY EXTRACTED.

## 2023-07-05 NOTE — Telephone Encounter (Signed)
   Patient Name: Megan Walsh  DOB: 1932/11/14 MRN: [id number]  Primary Cardiologist: Dina Rich, MD  Clinical pharmacists have reviewed the patient's past medical history, labs, and current medications as part of preoperative protocol coverage. The following recommendations have been made:  Per office protocol, patient can hold Eliquis for 1 days prior to procedure.    I will route this recommendation to the requesting party via Epic fax function and remove from pre-op pool.  Please call with questions.  Napoleon Form, Leodis Rains, NP 07/05/2023, 4:11 PM

## 2023-07-05 NOTE — Telephone Encounter (Signed)
 Pharmacy please advise on holding Eliquis prior to surgical extraction of 5 teeth scheduled for TBD. Thank you.

## 2023-07-05 NOTE — Telephone Encounter (Signed)
 Patient with diagnosis of afib on Eliquis for anticoagulation.    Procedure: Dental Extraction - Amount of Teeth to be Pulled: 5   Date of procedure: TBD   CHA2DS2-VASc Score = 7   This indicates a 11.2% annual risk of stroke. The patient's score is based upon: CHF History: 1 HTN History: 1 Diabetes History: 1 Stroke History: 0 Vascular Disease History: 1 Age Score: 2 Gender Score: 1      CrCl 23 mL/min Platelet count 134 K    Per office protocol, patient can hold Eliquis for 1 days prior to procedure.    **This guidance is not considered finalized until pre-operative APP has relayed final recommendations.**

## 2023-07-08 ENCOUNTER — Ambulatory Visit: Payer: 59

## 2023-07-09 DEATH — deceased

## 2023-07-15 ENCOUNTER — Encounter: Payer: 59 | Admitting: *Deleted

## 2023-08-11 ENCOUNTER — Ambulatory Visit: Payer: 59 | Admitting: Nurse Practitioner

## 2023-10-07 ENCOUNTER — Ambulatory Visit: Payer: Medicare Other

## 2024-01-06 ENCOUNTER — Ambulatory Visit: Payer: Medicare Other

## 2024-04-10 ENCOUNTER — Ambulatory Visit: Payer: Medicare Other

## 2024-07-06 ENCOUNTER — Ambulatory Visit: Payer: Medicare Other
